# Patient Record
Sex: Male | Born: 1945 | Race: White | Hispanic: No | Marital: Married | Smoking: Never smoker
Health system: Southern US, Community
[De-identification: ages and names within clinical notes are randomized; demographics above are authoritative.]

## PROBLEM LIST (undated history)

## (undated) DIAGNOSIS — M1990 Unspecified osteoarthritis, unspecified site: Secondary | ICD-10-CM

## (undated) DIAGNOSIS — R053 Chronic cough: Secondary | ICD-10-CM

## (undated) DIAGNOSIS — Z87442 Personal history of urinary calculi: Secondary | ICD-10-CM

## (undated) DIAGNOSIS — G8929 Other chronic pain: Secondary | ICD-10-CM

## (undated) DIAGNOSIS — D494 Neoplasm of unspecified behavior of bladder: Secondary | ICD-10-CM

## (undated) DIAGNOSIS — K219 Gastro-esophageal reflux disease without esophagitis: Secondary | ICD-10-CM

## (undated) DIAGNOSIS — K829 Disease of gallbladder, unspecified: Secondary | ICD-10-CM

## (undated) DIAGNOSIS — I82409 Acute embolism and thrombosis of unspecified deep veins of unspecified lower extremity: Secondary | ICD-10-CM

## (undated) DIAGNOSIS — J8283 Eosinophilic asthma: Secondary | ICD-10-CM

## (undated) DIAGNOSIS — T7840XA Allergy, unspecified, initial encounter: Secondary | ICD-10-CM

## (undated) DIAGNOSIS — Z87438 Personal history of other diseases of male genital organs: Secondary | ICD-10-CM

## (undated) DIAGNOSIS — Z8719 Personal history of other diseases of the digestive system: Secondary | ICD-10-CM

## (undated) DIAGNOSIS — J45909 Unspecified asthma, uncomplicated: Secondary | ICD-10-CM

## (undated) DIAGNOSIS — Z8669 Personal history of other diseases of the nervous system and sense organs: Secondary | ICD-10-CM

## (undated) DIAGNOSIS — R251 Tremor, unspecified: Secondary | ICD-10-CM

## (undated) DIAGNOSIS — Z86711 Personal history of pulmonary embolism: Secondary | ICD-10-CM

## (undated) DIAGNOSIS — I10 Essential (primary) hypertension: Secondary | ICD-10-CM

## (undated) DIAGNOSIS — D696 Thrombocytopenia, unspecified: Secondary | ICD-10-CM

## (undated) HISTORY — PX: HERNIA REPAIR: SHX51

## (undated) HISTORY — PX: TOTAL HIP ARTHROPLASTY: SHX124

## (undated) HISTORY — PX: PROSTATE SURGERY: SHX751

## (undated) HISTORY — DX: Unspecified asthma, uncomplicated: J45.909

## (undated) HISTORY — DX: Essential (primary) hypertension: I10

## (undated) HISTORY — PX: NASAL SINUS SURGERY: SHX719

## (undated) HISTORY — PX: CHOLECYSTECTOMY: SHX55

---
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
## 1999-02-07 HISTORY — PX: EYE SURGERY: SHX253

## 2008-02-07 HISTORY — PX: CHOLECYSTECTOMY: SHX55

## 2008-02-07 HISTORY — PX: HERNIA REPAIR: SHX51

## 2014-02-06 HISTORY — PX: TOTAL HIP ARTHROPLASTY: SHX124

## 2016-03-14 ENCOUNTER — Encounter: Payer: Self-pay | Admitting: Internal Medicine

## 2016-03-14 LAB — PULMONARY FUNCTION TEST

## 2017-04-06 DIAGNOSIS — I82409 Acute embolism and thrombosis of unspecified deep veins of unspecified lower extremity: Secondary | ICD-10-CM

## 2017-04-06 HISTORY — DX: Acute embolism and thrombosis of unspecified deep veins of unspecified lower extremity: I82.409

## 2017-04-23 ENCOUNTER — Encounter: Payer: Self-pay | Admitting: Internal Medicine

## 2017-04-24 ENCOUNTER — Encounter: Payer: Self-pay | Admitting: Internal Medicine

## 2017-04-25 ENCOUNTER — Encounter: Payer: Self-pay | Admitting: Internal Medicine

## 2017-05-23 ENCOUNTER — Encounter: Payer: Self-pay | Admitting: Internal Medicine

## 2017-05-23 DIAGNOSIS — J45909 Unspecified asthma, uncomplicated: Secondary | ICD-10-CM

## 2017-05-23 DIAGNOSIS — N401 Enlarged prostate with lower urinary tract symptoms: Secondary | ICD-10-CM

## 2017-05-23 DIAGNOSIS — K219 Gastro-esophageal reflux disease without esophagitis: Secondary | ICD-10-CM

## 2017-05-23 DIAGNOSIS — I10 Essential (primary) hypertension: Secondary | ICD-10-CM | POA: Insufficient documentation

## 2017-05-23 DIAGNOSIS — N400 Enlarged prostate without lower urinary tract symptoms: Secondary | ICD-10-CM | POA: Insufficient documentation

## 2017-05-24 ENCOUNTER — Ambulatory Visit: Payer: Medicare Other | Admitting: Internal Medicine

## 2017-05-24 VITALS — BP 110/64 | HR 94 | Ht 70.0 in | Wt 206.0 lb

## 2017-05-24 DIAGNOSIS — I82431 Acute embolism and thrombosis of right popliteal vein: Secondary | ICD-10-CM

## 2017-05-24 DIAGNOSIS — J45909 Unspecified asthma, uncomplicated: Secondary | ICD-10-CM | POA: Diagnosis not present

## 2017-05-24 DIAGNOSIS — I2692 Saddle embolus of pulmonary artery without acute cor pulmonale: Secondary | ICD-10-CM

## 2017-05-24 MED ORDER — APIXABAN 5 MG PO TABS: 5.0000 mg | ORAL_TABLET | Freq: Two times a day (BID) | ORAL | 11 refills | Status: DC

## 2017-05-24 NOTE — Patient Instructions (Addendum)
Will refer to allergist.   Continue eliquis.  I would recommend a D-Dimer test and leg ultrasound when stopping eliquis, if either is elevated, you may benefit from staying on the eliquis.   If the Eliquis is stopped, you may benefit from a full dose aspirin or a dose of eliquis with long drives.

## 2017-05-24 NOTE — Progress Notes (Signed)
[facility] Consultation      Assessment and Plan:  Severe asthma, cough variant.  -Severe cough of many years duration, with elevated eosinophil counts in the past.  These appear to have resolved with Nucala. - We offered to start Nucala injections, however these are only performed in our [place] office.  Patient would like something locally, therefore we are happy to refer him to a local allergist to restart his Nucala injections.  PE and right lower extremity DVT.  -Symptoms appear to have resolved, the patient appears to have recovered. -I discussed with him today that the typical course is 3-6 months of Eliquis.  In his case I would recommend 6 months with an ultrasound and d-dimer at the end of the course.  If these are positive he may benefit from continuation of his Eliquis. -If Eliquis is discontinued he might benefit from a full dose aspirin for dose of Eliquis with long drives. -Patient has most of his physicians down in [place], he is going to spend his time in between the 2 locations.  He is going to follow-up with [place] on an as-needed basis.  Meds ordered this encounter  Medications  . apixaban (ELIQUIS) 5 MG TABS tablet    Sig: Take 1 tablet (5 mg total) by mouth 2 (two) times daily.    Dispense:  60 tablet    Refill:  11      Date: 05/24/2017  MRN# [id number] Daniel Mason 11-02-1945  Referring Physician: self referred.   Daniel Mason is a 72 y.o. old male seen in consultation for chief complaint of:    Chief Complaint  Patient presents with  . Consult    HPI:   The patient (pronounced Meter) is a 72 year old male he was admitted and set up for a [facility] in [place], he was in the process of buying a new home in [place], and had driven to [place] and back to [place].  He was presented to the hospital on 04/23/17 with symptoms of dyspnea, left lower extremity pain and swelling.  He has no previous history of DVT,  PE.  Nor does he have any family history of DVT or thromboembolic disease.  On initial presentation his creatinine was mildly elevated, he was also noted to have an iodine allergy he was diagnosed with pulmonary embolism based on high probability VQ scan.  Doppler scanning also showed a left lower extremity popliteal DVT.  He required 2 L of oxygen and was otherwise hemodynamically stable.  Patient was treated with Lovenox and discharged with Eliquis.  Echocardiogram performed on 3/19 showed an EF of 50%, mild pulmonary hypertension of 40-45 mmHg.  He has a history of severe asthma, hypertension, GERD he is treated with Nucala for his hypertension. He feels that his breathing is doing well and is back to normal. He used to have severe cough variant asthma for several years and had severe cough. He was started on Nucala about 3 years ago and the cough is now gone. He gets Nucala once monthly.  They travel often between [place], about 5-6 trips per year.   Extensive outside records were reviewed and summarized in the HPI above.  PMHX:   No past medical history on file. Surgical Hx:  Past Surgical History:  Procedure Laterality Date  . CHOLECYSTECTOMY    . HERNIA REPAIR    . NASAL SINUS SURGERY    . PROSTATE SURGERY     x2  .  TOTAL HIP ARTHROPLASTY     Family Hx:  No family history on file. Social Hx:   Social History   Tobacco Use  . Smoking status: Never Smoker  . Smokeless tobacco: Never Used  Substance Use Topics  . Alcohol use: Never    Frequency: Never  . Drug use: Never   Medication:    Current Outpatient Medications:  .  albuterol (VENTOLIN HFA) 108 (90 Base) MCG/ACT inhaler, Inhale 2 puffs into the lungs every 6 (six) hours as needed for wheezing or shortness of breath., Disp: , Rfl:  .  apixaban (ELIQUIS) 5 MG TABS tablet, Take 5 mg by mouth 2 (two) times daily., Disp: , Rfl:  .  beclomethasone (QVAR) 40 MCG/ACT inhaler, Inhale 2 puffs into the lungs at bedtime.,  Disp: , Rfl:  .  famotidine (PEPCID) 10 MG tablet, Take 10 mg by mouth at bedtime., Disp: , Rfl:  .  FINASTERIDE PO, Take 1 capsule by mouth daily., Disp: , Rfl:  .  tamsulosin (FLOMAX) 0.4 MG CAPS capsule, Take 0.4 mg by mouth., Disp: , Rfl:  .  valsartan (DIOVAN) 40 MG tablet, Take 40 mg by mouth daily., Disp: , Rfl:    Allergies:  Iodine; Penicillins; and Sulfa antibiotics  Review of Systems: Gen:  Denies  fever, sweats, chills HEENT: Denies blurred vision, double vision. bleeds, sore throat Cvc:  No dizziness, chest pain. Resp:   Denies cough or sputum production, shortness of breath Gi: Denies swallowing difficulty, stomach pain. Gu:  Denies bladder incontinence, burning urine Ext:   No Joint pain, stiffness. Skin: No skin rash,  hives  Endoc:  No polyuria, polydipsia. Psych: No depression, insomnia. Other:  All other systems were reviewed with the patient and were negative other that what is mentioned in the HPI.   Physical Examination:   VS: BP 110/64 (BP Location: Left Arm, Cuff Size: Normal)   Pulse 94   Ht 5\' 10"  (1.778 m)   Wt 206 lb (93.4 kg)   SpO2 95%   BMI 29.56 kg/m   General Appearance: No distress  Neuro:without focal findings,  speech normal,  HEENT: PERRLA, EOM intact.   Pulmonary: normal breath sounds, No wheezing.  CardiovascularNormal S1,S2.  No m/r/g.   Abdomen: Benign, Soft, non-tender. Renal:  No costovertebral tenderness  GU:  No performed at this time. Endoc: No evident thyromegaly, no signs of acromegaly. Skin:   warm, no rashes, no ecchymosis  Extremities: normal, no cyanosis, clubbing.  No extremity edema.  Other findings:    LABORATORY PANEL:   CBC No results for input(s): WBC, HGB, HCT, PLT in the last 168 hours. ------------------------------------------------------------------------------------------------------------------  Chemistries  No results for input(s): NA, K, CL, CO2, GLUCOSE, BUN, CREATININE, CALCIUM, MG, AST, ALT,  ALKPHOS, BILITOT in the last 168 hours.  Invalid input(s): GFRCGP ------------------------------------------------------------------------------------------------------------------  Cardiac Enzymes No results for input(s): TROPONINI in the last 168 hours. ------------------------------------------------------------  RADIOLOGY:  No results found.     Thank  you for the consultation and for allowing [facility] to assist in the care of your patient. Our recommendations are noted above.  Please contact us if we can be of further service.   Marda Stalker, MD.  Board Certified in Internal Medicine, Pulmonary Medicine, [place], and Sleep Medicine.  [facility] Office Number: [phone]  Patricia Pesa, M.D.  Merton Border, M.D  05/24/2017

## 2017-07-30 ENCOUNTER — Telehealth: Payer: Self-pay | Admitting: Internal Medicine

## 2017-07-30 NOTE — Telephone Encounter (Signed)
After stopping blood thinner, check D-Dimer and bilateral LE ultrasound 2 weeks later.  However I would recommend that he take the blood thinner for 6 months from the time of the initial blood clot (in March).

## 2017-07-30 NOTE — Telephone Encounter (Signed)
Patient came by office  Patient states he will be wanting to quit Eliquis in August and have his D-Dimer test done around that time Please call to discuss

## 2017-07-31 NOTE — Telephone Encounter (Signed)
Pt advised. He will continue Eliquis and have test performed in [place]. He is about to travel.

## 2018-09-17 ENCOUNTER — Other Ambulatory Visit: Payer: Self-pay | Admitting: *Deleted

## 2018-09-17 ENCOUNTER — Other Ambulatory Visit: Payer: Self-pay | Admitting: Urology

## 2018-09-17 DIAGNOSIS — R310 Gross hematuria: Secondary | ICD-10-CM

## 2018-09-26 ENCOUNTER — Ambulatory Visit: Admission: RE | Admit: 2018-09-26 | Payer: Self-pay | Source: Ambulatory Visit

## 2018-09-30 DIAGNOSIS — D696 Thrombocytopenia, unspecified: Secondary | ICD-10-CM | POA: Insufficient documentation

## 2018-09-30 NOTE — Progress Notes (Signed)
[facility]  [phone]) [phone] Fax:([phone]) [phone]  ID: Sharlotte Alamo OB: 1945/09/29  MR#: [id number]  [id number]  Patient Care Team: System, Pcp Not In as PCP - General  CHIEF COMPLAINT: Thrombocytopenia  INTERVAL HISTORY: Patient is a 73 year old male on Eliquis who was also noted to have some hematuria.  Subsequent work-up revealed thrombocytopenia.  He currently feels well and is asymptomatic.  He has no further bleeding.  He has no neurologic complaints.  He denies any recent fevers or illnesses.  He has no new medications.  He has a good appetite and denies weight loss.  He denies any chest pain, shortness of breath, cough, or hemoptysis.  He has no nausea, vomiting, constipation, or diarrhea.  He has no melena or hematochezia.  He has no further urinary complaints.  Patient feels at his baseline and offers no specific complaints today.  REVIEW OF SYSTEMS:   Review of Systems  Constitutional: Negative.  Negative for fever, malaise/fatigue and weight loss.  Respiratory: Negative.  Negative for cough, hemoptysis and shortness of breath.   Cardiovascular: Negative.  Negative for chest pain and leg swelling.  Gastrointestinal: Negative.  Negative for abdominal pain, blood in stool and melena.  Genitourinary: Negative.  Negative for hematuria.  Musculoskeletal: Negative.  Negative for back pain.  Skin: Negative.  Negative for rash.  Neurological: Negative.  Negative for dizziness, focal weakness, weakness and headaches.  Endo/Heme/Allergies: Does not bruise/bleed easily.  Psychiatric/Behavioral: Negative.  The patient is not nervous/anxious.     As per HPI. Otherwise, a complete review of systems is negative.  PAST MEDICAL HISTORY: Past Medical History:  Diagnosis Date  . Asthma   . DVT (deep venous thrombosis) ([facility]) 04/2017  . Hypertension     PAST SURGICAL HISTORY: Past Surgical History:  Procedure Laterality Date  . CHOLECYSTECTOMY    . HERNIA  REPAIR    . NASAL SINUS SURGERY    . PROSTATE SURGERY     x2  . TOTAL HIP ARTHROPLASTY      FAMILY HISTORY: History reviewed. No pertinent family history.  ADVANCED DIRECTIVES (Y/N):  N  HEALTH MAINTENANCE: Social History   Tobacco Use  . Smoking status: Never Smoker  . Smokeless tobacco: Never Used  Substance Use Topics  . Alcohol use: Never    Frequency: Never  . Drug use: Never     Colonoscopy:  PAP:  Bone density:  Lipid panel:  Allergies  Allergen Reactions  . Iodine Shortness Of Breath and Cough  . Penicillins   . Sulfa Antibiotics     Current Outpatient Medications  Medication Sig Dispense Refill  . albuterol (VENTOLIN HFA) 108 (90 Base) MCG/ACT inhaler Inhale 2 puffs into the lungs every 6 (six) hours as needed for wheezing or shortness of breath.    Marland Kitchen FINASTERIDE PO Take 1 capsule by mouth daily.    . Mepolizumab (NUCALA [place]) Inject into the skin every 30 (thirty) days.    . tamsulosin (FLOMAX) 0.4 MG CAPS capsule Take 0.4 mg by mouth.    . valsartan (DIOVAN) 40 MG tablet Take 40 mg by mouth daily.    Marland Kitchen apixaban (ELIQUIS) 5 MG TABS tablet Take 1 tablet (5 mg total) by mouth 2 (two) times daily. (Patient not taking: Reported on 10/03/2018) 60 tablet 11  . beclomethasone (QVAR) 40 MCG/ACT inhaler Inhale 2 puffs into the lungs at bedtime.    . famotidine (PEPCID) 10 MG tablet Take 10 mg by mouth at bedtime.     No  current facility-administered medications for this visit.     OBJECTIVE: Vitals:   10/03/18 1331  BP: (!) 142/83  Pulse: 71  Resp: 18  Temp: 97.8 F (36.6 C)     Body mass index is 29.46 kg/m.    ECOG FS:0 - Asymptomatic  General: Well-developed, well-nourished, no acute distress. Eyes: Pink conjunctiva, anicteric sclera. HEENT: Normocephalic, moist mucous membranes, clear oropharnyx. Lungs: Clear to auscultation bilaterally. Heart: Regular rate and rhythm. No rubs, murmurs, or gallops. Abdomen: Soft, nontender, nondistended. No  organomegaly noted, normoactive bowel sounds. Musculoskeletal: No edema, cyanosis, or clubbing. Neuro: Alert, answering all questions appropriately. Cranial nerves grossly intact. Skin: No rashes or petechiae noted. Psych: Normal affect. Lymphatics: No cervical, calvicular, axillary or inguinal LAD.   LAB RESULTS:  No results found for: NA, K, CL, CO2, GLUCOSE, BUN, CREATININE, CALCIUM, PROT, ALBUMIN, AST, ALT, ALKPHOS, BILITOT, GFRNONAA, GFRAA  Lab Results  Component Value Date   WBC 7.2 10/03/2018   HGB 15.0 10/03/2018   HCT 44.2 10/03/2018   MCV 88.9 10/03/2018   PLT 191 10/03/2018     STUDIES: No results found.  ASSESSMENT: Thrombocytopenia.  PLAN:    1. Thrombocytopenia: Resolved.  Patient's platelet count is now within normal limits at 191.  Ferritin, B12, and folate are all within normal limits.  Platelet antibodies and SPEP were drawn for completeness and are pending at time of dictation.  Unclear reason for patient's transient drop of platelets.  May have been consumptive since patient he reports he was actively bleeding at that time.  No further intervention is needed.  Patient will have video assisted telemedicine visit in 3 weeks to discuss his results. 2.  Hematuria: Resolved.  Continue follow-up with urology as indicated. 3.  History of DVT: Patient's reports he was placed on Eliquis over a year ago after having found to have a DVT from an extended car ride driving from [place] to [place] and back.  Patient reports there was no other inciting incident or reason for his DVT.  He does not require additional Eliquis, but if patient had a second DVT or blood clot would recommend lifelong anticoagulation at that time.  Patient expressed interest in continuing anticoagulation for extended trips.  I spent a total of 45 minutes face-to-face with the patient of which greater than 50% of the visit was spent in counseling and coordination of care as detailed above.    Patient expressed understanding and was in agreement with this plan. He also understands that He can call clinic at any time with any questions, concerns, or complaints.    Lloyd Huger, MD   10/04/2018 6:18 AM

## 2018-10-03 ENCOUNTER — Encounter: Payer: Self-pay | Admitting: Oncology

## 2018-10-03 ENCOUNTER — Other Ambulatory Visit: Payer: Self-pay

## 2018-10-03 ENCOUNTER — Inpatient Hospital Stay: Payer: Medicare HMO | Attending: Oncology | Admitting: Oncology

## 2018-10-03 ENCOUNTER — Encounter (INDEPENDENT_AMBULATORY_CARE_PROVIDER_SITE_OTHER): Payer: Self-pay

## 2018-10-03 ENCOUNTER — Inpatient Hospital Stay: Payer: Medicare HMO

## 2018-10-03 DIAGNOSIS — Z79899 Other long term (current) drug therapy: Secondary | ICD-10-CM | POA: Insufficient documentation

## 2018-10-03 DIAGNOSIS — Z7901 Long term (current) use of anticoagulants: Secondary | ICD-10-CM | POA: Diagnosis not present

## 2018-10-03 DIAGNOSIS — D696 Thrombocytopenia, unspecified: Secondary | ICD-10-CM | POA: Diagnosis not present

## 2018-10-03 DIAGNOSIS — J45909 Unspecified asthma, uncomplicated: Secondary | ICD-10-CM | POA: Diagnosis not present

## 2018-10-03 DIAGNOSIS — I10 Essential (primary) hypertension: Secondary | ICD-10-CM | POA: Insufficient documentation

## 2018-10-03 DIAGNOSIS — Z7951 Long term (current) use of inhaled steroids: Secondary | ICD-10-CM | POA: Insufficient documentation

## 2018-10-03 DIAGNOSIS — Z86718 Personal history of other venous thrombosis and embolism: Secondary | ICD-10-CM | POA: Diagnosis not present

## 2018-10-03 LAB — CBC
HCT: 44.2 % (ref 39.0–52.0)
Hemoglobin: 15 g/dL (ref 13.0–17.0)
MCH: 30.2 pg (ref 26.0–34.0)
MCHC: 33.9 g/dL (ref 30.0–36.0)
MCV: 88.9 fL (ref 80.0–100.0)
Platelets: 191 10*3/uL (ref 150–400)
RBC: 4.97 MIL/uL (ref 4.22–5.81)
RDW: 13.2 % (ref 11.5–15.5)
WBC: 7.2 10*3/uL (ref 4.0–10.5)
nRBC: 0 % (ref 0.0–0.2)

## 2018-10-03 LAB — LACTATE DEHYDROGENASE: LDH: 132 U/L (ref 98–192)

## 2018-10-03 LAB — FOLATE: Folate: 26 ng/mL (ref >5.9)

## 2018-10-03 LAB — VITAMIN B12: Vitamin B-12: 587 pg/mL (ref 180–914)

## 2018-10-03 LAB — FERRITIN: Ferritin: 22 ng/mL — ABNORMAL LOW (ref 24–336)

## 2018-10-03 NOTE — Progress Notes (Signed)
Patient has recently moved here from [place].  Being evaluated by urology for hematuria.  During work up he was found to have low platelet count.

## 2018-10-04 LAB — PROTEIN ELECTROPHORESIS, SERUM
A/G Ratio: 1.4 (ref 0.7–1.7)
Albumin ELP: 3.6 g/dL (ref 2.9–4.4)
Alpha-1-Globulin: 0.2 g/dL (ref 0.0–0.4)
Alpha-2-Globulin: 0.8 g/dL (ref 0.4–1.0)
Beta Globulin: 1 g/dL (ref 0.7–1.3)
Gamma Globulin: 0.5 g/dL (ref 0.4–1.8)
Globulin, Total: 2.5 g/dL (ref 2.2–3.9)
Total Protein ELP: 6.1 g/dL (ref 6.0–8.5)

## 2018-10-04 LAB — PLATELET ANTIBODY PROFILE
Glycoprotein IV Antibody: NEGATIVE
HLA Ab Ser Ql EIA: NEGATIVE
IA/IIA Antibody: NEGATIVE
IB/IX Antibody: NEGATIVE
IIB/IIIA Antibody: NEGATIVE

## 2018-10-04 LAB — HAPTOGLOBIN: Haptoglobin: 200 mg/dL (ref 34–355)

## 2018-10-24 NOTE — Progress Notes (Signed)
[facility]  Telephone:(336) [phone] Fax:(336) [phone]  ID: Daniel Mason OB: Jul 06, 1945  MR#: [id number]  [id number]  Patient Care Team: System, Pcp Not In as PCP - General  I connected with Daniel Mason on 10/29/18 at  2:45 PM EDT by video enabled telemedicine visit and verified that I am speaking with the correct person using two identifiers.   I discussed the limitations, risks, security and privacy concerns of performing an evaluation and management service by telemedicine and the availability of in-person appointments. I also discussed with the patient that there may be a patient responsible charge related to this service. The patient expressed understanding and agreed to proceed.   Other persons participating in the visit and their role in the encounter: Patient, MD  Patient's location: Home Provider's location: Clinic  CHIEF COMPLAINT: Thrombocytopenia  INTERVAL HISTORY: Patient agreed to video assisted telemedicine visit for further evaluation and discussion of his laboratory results.  He continues to feel well and remains asymptomatic. He has no neurologic complaints.  He denies any recent fevers or illnesses. He has a good appetite and denies weight loss.  He denies any chest pain, shortness of breath, cough, or hemoptysis.  He has no nausea, vomiting, constipation, or diarrhea.  He has no melena or hematochezia.  He has no further urinary complaints.  Patient offers no specific complaints today.  REVIEW OF SYSTEMS:   Review of Systems  Constitutional: Negative.  Negative for fever, malaise/fatigue and weight loss.  Respiratory: Negative.  Negative for cough, hemoptysis and shortness of breath.   Cardiovascular: Negative.  Negative for chest pain and leg swelling.  Gastrointestinal: Negative.  Negative for abdominal pain, blood in stool and melena.  Genitourinary: Negative.  Negative for hematuria.  Musculoskeletal: Negative.  Negative for back pain.   Skin: Negative.  Negative for rash.  Neurological: Negative.  Negative for dizziness, focal weakness, weakness and headaches.  Endo/Heme/Allergies: Does not bruise/bleed easily.  Psychiatric/Behavioral: Negative.  The patient is not nervous/anxious.     As per HPI. Otherwise, a complete review of systems is negative.  PAST MEDICAL HISTORY: Past Medical History:  Diagnosis Date  . Asthma   . DVT (deep venous thrombosis) ([facility]) 04/2017  . Hypertension     PAST SURGICAL HISTORY: Past Surgical History:  Procedure Laterality Date  . CHOLECYSTECTOMY    . HERNIA REPAIR    . NASAL SINUS SURGERY    . PROSTATE SURGERY     x2  . TOTAL HIP ARTHROPLASTY      FAMILY HISTORY: History reviewed. No pertinent family history.  ADVANCED DIRECTIVES (Y/N):  N  HEALTH MAINTENANCE: Social History   Tobacco Use  . Smoking status: Never Smoker  . Smokeless tobacco: Never Used  Substance Use Topics  . Alcohol use: Never    Frequency: Never  . Drug use: Never     Colonoscopy:  PAP:  Bone density:  Lipid panel:  Allergies  Allergen Reactions  . Iodine Shortness Of Breath and Cough  . Penicillins   . Sulfa Antibiotics     Current Outpatient Medications  Medication Sig Dispense Refill  . albuterol (VENTOLIN HFA) 108 (90 Base) MCG/ACT inhaler Inhale 2 puffs into the lungs every 6 (six) hours as needed for wheezing or shortness of breath.    . beclomethasone (QVAR) 40 MCG/ACT inhaler Inhale 2 puffs into the lungs at bedtime.    Marland Kitchen FINASTERIDE PO Take 1 capsule by mouth daily.    . Mepolizumab (NUCALA [place]) Inject into the skin  every 30 (thirty) days.    . tamsulosin (FLOMAX) 0.4 MG CAPS capsule Take 0.4 mg by mouth.    . valsartan (DIOVAN) 40 MG tablet Take 40 mg by mouth daily.     No current facility-administered medications for this visit.     OBJECTIVE: There were no vitals filed for this visit.   There is no height or weight on file to calculate BMI.    ECOG FS:0 - Asymptomatic   General: Well-developed, well-nourished, no acute distress. HEENT: Normocephalic. Neuro: Alert, answering all questions appropriately. Cranial nerves grossly intact. Psych: Normal affect.   LAB RESULTS:  No results found for: NA, K, CL, CO2, GLUCOSE, BUN, CREATININE, CALCIUM, PROT, ALBUMIN, AST, ALT, ALKPHOS, BILITOT, GFRNONAA, GFRAA  Lab Results  Component Value Date   WBC 7.2 10/03/2018   HGB 15.0 10/03/2018   HCT 44.2 10/03/2018   MCV 88.9 10/03/2018   PLT 191 10/03/2018     STUDIES: No results found.  ASSESSMENT: Thrombocytopenia.  PLAN:    1. Thrombocytopenia: Resolved.  Patient's platelet count is now within normal limits at 191.  Ferritin, B12, and folate are all within normal limits.  Platelet antibodies and SPEP are negative. Unclear reason for patient's transient drop of platelets.  May have been consumptive since patient he reports he was actively bleeding at that time.  No intervention is needed.  No follow-up has been scheduled.  Please refer patient back if there are any questions or concerns.  2.  Hematuria: Resolved.  Continue follow-up with urology as indicated. 3.  History of DVT: Patient's reports he was placed on Eliquis over a year ago after having found to have a DVT from an extended car ride driving from [place] to [place] and back.  Patient reports there was no other inciting incident or reason for his DVT.  He does not require additional Eliquis, but if patient had a second DVT or blood clot would recommend lifelong anticoagulation at that time.  Patient expressed interest in continuing anticoagulation for extended trips.  I provided 15 minutes of face-to-face video visit time during this encounter, and > 50% was spent counseling as documented under my assessment & plan.    Patient expressed understanding and was in agreement with this plan. He also understands that He can call clinic at any time with any questions, concerns, or complaints.     Lloyd Huger, MD   10/29/2018 3:33 PM

## 2018-10-28 ENCOUNTER — Inpatient Hospital Stay: Payer: Medicare HMO | Attending: Oncology | Admitting: Oncology

## 2018-10-28 ENCOUNTER — Other Ambulatory Visit: Payer: Self-pay

## 2018-10-28 ENCOUNTER — Encounter: Payer: Self-pay | Admitting: Oncology

## 2018-10-28 DIAGNOSIS — D696 Thrombocytopenia, unspecified: Secondary | ICD-10-CM

## 2018-10-28 NOTE — Progress Notes (Signed)
Patient stated that he had been doing well and would like his lab results.

## 2018-11-04 ENCOUNTER — Other Ambulatory Visit: Payer: Medicare HMO

## 2018-11-13 NOTE — H&P (Signed)
NAME: Daniel Mason, Daniel Mason MEDICAL RECORD [id number] ACCOUNT [id number] DATE OF BIRTH:27-Jun-1945 FACILITY: [facility] LOCATION: [facility] PHYSICIAN:MICHAEL Farrel Conners, MD  HISTORY AND PHYSICAL  DATE OF ADMISSION:  11/21/2018  CHIEF COMPLAINT:  Urinary retention and bloody urine.  HISTORY OF PRESENT ILLNESS:  The patient is a 73 year old Caucasian male with gross hematuria and urinary retention.  He underwent cystoscopy with clot evacuation 08/14 and was found to have a 15 mm bladder stone and trilobar BPH with a 5 cm prostatic  urethral length and intravesical growth of median lobe.  The patient was also found to have thrombocytopenia, and hematology evaluation recommended discontinuing Eliquis.  The patient is currently on finasteride.  Upper urinary tract evaluation with CT  scan on 09/04 revealed a 5 mm nonobstructing left renal stone, but otherwise was unremarkable.  The patient comes in now for photovaporization of prostate with GreenLight laser and litholapaxy of the bladder stone.  ALLERGIES:  SULFA DRUGS.  CURRENT MEDICATIONS:  Valsartan, finasteride, tamsulosin, omeprazole, Nucala, men's multivitamin, cyclobenzaprine, and Qvar.  PAST SURGICAL HISTORY:  Bilateral cataract surgery 2001, laser prostatectomy 2001, TURP 2012, cholecystectomy 2013, and left hip replacement in 2016.  PAST AND CURRENT MEDICAL CONDITIONS: 1.  Asthma. 2.  GERD. 3.  Eosinophilia. 4.  Pulmonary embolus 2019. 5.  Kidney stones. 6.  Sciatica. 7.  Benign prostatic hypertrophy with obstruction.  REVIEW OF SYSTEMS:  The patient has chronic constipation.  He denies chest pain, shortness of breath, diabetes, stroke, or heart disease.  SOCIAL HISTORY:  The patient denied tobacco use.  Consumes 4 alcoholic beverages per week.  FAMILY HISTORY:  Father died at age 67 of COPD.  Mother died at age 3 with dementia.  The patient has a brother age 73 with BPH.  PHYSICAL EXAMINATION: VITAL SIGNS:  Height 5 feet 9 inches,  weight 203.  BMI 30. GENERAL:  A well-nourished white male in no distress. HEENT:  Sclerae were clear.  Pupils were equally round, reactive to light and accommodation.  Extraocular movements were intact. NECK:  No palpable masses or tenderness.  Thyroid gland was smooth without nodules.  No audible carotid bruits. LYMPHATIC:  No palpable neck or inguinal adenopathy. PULMONARY:  Lungs clear to auscultation. CARDIOVASCULAR:  Regular rhythm and rate without audible murmurs. ABDOMEN:  Soft, nontender abdomen.  No CVA tenderness. GENITOURINARY:  Circumcised.  Testes were smooth and nontender, approximately 20 mL in size each. RECTAL:  30 g, smooth, nontender prostate. NEUROMUSCULAR:  Alert and oriented x3.  IMPRESSION: 1.  Benign prostatic hypertrophy with bladder outlet obstruction. 2.  Hematuria due to benign prostatic hypertrophy and bladder stone. 3.  Bladder stone.  PLAN: 1.  Photovaporization of prostate with GreenLight laser. 2.  Litholapaxy of bladder stone with holmium laser.  LN/NUANCE  D:11/13/2018 T:11/13/2018 JOB:[id number]

## 2018-11-18 ENCOUNTER — Inpatient Hospital Stay: Admission: RE | Admit: 2018-11-18 | Payer: Medicare HMO | Source: Ambulatory Visit

## 2018-11-19 ENCOUNTER — Encounter
Admission: RE | Admit: 2018-11-19 | Discharge: 2018-11-19 | Disposition: A | Discharge status: Home / Self Care | Payer: Medicare HMO | Source: Ambulatory Visit | Attending: Urology | Admitting: Urology

## 2018-11-19 ENCOUNTER — Other Ambulatory Visit: Payer: Self-pay

## 2018-11-19 DIAGNOSIS — Z01812 Encounter for preprocedural laboratory examination: Secondary | ICD-10-CM | POA: Insufficient documentation

## 2018-11-19 DIAGNOSIS — Z20828 Contact with and (suspected) exposure to other viral communicable diseases: Secondary | ICD-10-CM | POA: Insufficient documentation

## 2018-11-19 DIAGNOSIS — Z01810 Encounter for preprocedural cardiovascular examination: Secondary | ICD-10-CM | POA: Diagnosis not present

## 2018-11-19 HISTORY — DX: Unspecified osteoarthritis, unspecified site: M19.90

## 2018-11-19 HISTORY — DX: Personal history of urinary calculi: Z87.442

## 2018-11-19 HISTORY — DX: Gastro-esophageal reflux disease without esophagitis: K21.9

## 2018-11-19 LAB — BASIC METABOLIC PANEL
Anion gap: 7 (ref 5–15)
BUN: 22 mg/dL (ref 8–23)
CO2: 28 mmol/L (ref 22–32)
Calcium: 8.8 mg/dL — ABNORMAL LOW (ref 8.9–10.3)
Chloride: 106 mmol/L (ref 98–111)
Creatinine, Ser: 1.31 mg/dL — ABNORMAL HIGH (ref 0.61–1.24)
GFR calc Af Amer: >60 mL/min (ref >60)
GFR calc non Af Amer: 54 mL/min — ABNORMAL LOW (ref >60)
Glucose, Bld: 103 mg/dL — ABNORMAL HIGH (ref 70–99)
Potassium: 4 mmol/L (ref 3.5–5.1)
Sodium: 141 mmol/L (ref 135–145)

## 2018-11-19 LAB — CBC
HCT: 47.1 % (ref 39.0–52.0)
Hemoglobin: 16 g/dL (ref 13.0–17.0)
MCH: 29.9 pg (ref 26.0–34.0)
MCHC: 34 g/dL (ref 30.0–36.0)
MCV: 87.9 fL (ref 80.0–100.0)
Platelets: DECREASED 10*3/uL (ref 150–400)
RBC: 5.36 MIL/uL (ref 4.22–5.81)
RDW: 13 % (ref 11.5–15.5)
WBC: 5.7 10*3/uL (ref 4.0–10.5)
nRBC: 0 % (ref 0.0–0.2)

## 2018-11-19 NOTE — Patient Instructions (Addendum)
INSTRUCTIONS FOR SURGERY     Your surgery is scheduled for:   Thursday, November 21, 2018     To find out your arrival time for the day of surgery,          please call [phone] between 1 pm and 3 pm on :  Wednesday, October 14TH     When you arrive for surgery, report to the [facility].       Do NOT stop on the first floor to register.    REMEMBER: Instructions that are not followed completely may result in serious medical risk,  up to and including death, or upon the discretion of your surgeon and anesthesiologist,            your surgery may need to be rescheduled.  __X__ 1. Do not eat food after midnight the night before your procedure.                    No gum, candy, lozenger, tic tacs, tums or hard candies.                  ABSOLUTELY NOTHING SOLID IN YOUR MOUTH AFTER MIDNIGHT                    You may drink unlimited clear liquids up to 2 hours before you are scheduled to arrive for surgery.                   Do not drink anything within those 2 hours unless you need to take medicine, then take the                   smallest amount you need.  Clear liquids include:  water, apple juice without pulp,                   any flavor Gatorade, Black coffee, black tea.  Sugar may be added but no dairy/ honey /lemon.                        Broth and jello is not considered a clear liquid.  __x__  2. On the morning of surgery, please brush your teeth with toothpaste and water. You may rinse with                  mouthwash if you wish but DO NOT SWALLOW TOOTHPASTE OR MOUTHWASH  __X___3. NO alcohol for 24 hours before or after surgery.  __x___ 4.  Do NOT smoke or use e-cigarettes for 24 HOURS PRIOR TO SURGERY.                      DO NOT Use any chewable tobacco products for at least 6 hours prior to surgery.  __x___ 5. If you start any new medication after this appointment and prior to surgery, please                Bring it with you on the day of surgery.  ___x__ 6. Notify your doctor if there is any change in your medical condition,  such as fever, infection, vomitting,                   Diarrhea or any open sores.  __x___ 7.  USE the antibacterial SOAP as instructed, the night before surgery OR the day of surgery.                   Once you have washed with this soap, do NOT use any of the following: Powders, perfumes                    or lotions. Please do not wear make up, hairpins, clips or nail polish. You MAY wear deodorant.                   Men may shave their face and neck.  Women need to shave 48 hours prior to surgery.                   DO NOT wear ANY jewelry on the day of surgery. If there are rings that are too tight to                    remove easily, please address this prior to the surgery day. Piercings need to be removed.                                                                     NO METAL ON YOUR BODY.                    Do NOT bring any valuables.  If you came to Pre-Admit testing then you will not need license,                     insurance card or credit card.  If you will be staying overnight, please either leave your things in                     the car or have your family be responsible for these items.                     [facility] IS NOT RESPONSIBLE FOR BELONGINGS OR VALUABLES.  ___X__ 8. DO NOT wear contact lenses on surgery day.  You may not have dentures,                     Hearing aides, contacts or glasses in the operating room. These items can be                    Placed in the Recovery Room to receive immediately after surgery.  __x___ 9. IF YOU ARE SCHEDULED TO GO HOME ON THE SAME DAY, YOU MUST                   Have someone to drive you home and to stay with you  for the first 24 hours.                    Have an arrangement prior to arriving on surgery day.  ___x__ 10. Take the following medications on the morning of  surgery with a sip  of water:                              1.    ALBUTEROL                                   2.    QVAR                     3.    FINASTERIDE                     4.   PRILOSEC, take the night before as usual and again in the morning                     5.                     6.  _____ 11.  Follow any instructions provided to you by your surgeon.                        Such as enema, clear liquid bowel prep  __X__  12. STOP ASPIRIN AS OF: today                       THIS INCLUDES BC POWDERS / GOODIES POWDER  __x___ 13. STOP Anti-inflammatories as of: today                      This includes IBUPROFEN / MOTRIN / ADVIL / ALEVE/ NAPROXYN                    YOU MAY TAKE TYLENOL ANY TIME PRIOR TO SURGERY.   _____ 15. Bring your CPAP machine into preop with you on the morning of surgery.  __x____17.  Continue to take the following medications but do not take on the morning of surgery:                          Valsartan or multivits  ______18. If staying overnight, please have appropriate shoes to wear to be able to walk around the unit.                   Wear clean and comfortable clothing to the hospital.  Wear comfortable clothes to the hospital. Loose fitting pants

## 2018-11-20 LAB — SARS CORONAVIRUS 2 (TAT 6-24 HRS): SARS Coronavirus 2: NEGATIVE

## 2018-11-21 ENCOUNTER — Encounter: Payer: Self-pay | Admitting: Emergency Medicine

## 2018-11-21 ENCOUNTER — Ambulatory Visit: Payer: Medicare HMO | Admitting: Anesthesiology

## 2018-11-21 ENCOUNTER — Encounter
Admission: RE | Disposition: A | Discharge status: Home / Self Care | Payer: Self-pay | Source: Home / Self Care | Attending: Urology

## 2018-11-21 ENCOUNTER — Ambulatory Visit
Admission: RE | Admit: 2018-11-21 | Discharge: 2018-11-21 | Disposition: A | Discharge status: Home / Self Care | Payer: Medicare HMO | Attending: Urology | Admitting: Urology

## 2018-11-21 ENCOUNTER — Other Ambulatory Visit: Payer: Self-pay

## 2018-11-21 DIAGNOSIS — J45909 Unspecified asthma, uncomplicated: Secondary | ICD-10-CM | POA: Diagnosis not present

## 2018-11-21 DIAGNOSIS — Z86718 Personal history of other venous thrombosis and embolism: Secondary | ICD-10-CM | POA: Diagnosis not present

## 2018-11-21 DIAGNOSIS — Z882 Allergy status to sulfonamides status: Secondary | ICD-10-CM | POA: Diagnosis not present

## 2018-11-21 DIAGNOSIS — Z87442 Personal history of urinary calculi: Secondary | ICD-10-CM | POA: Insufficient documentation

## 2018-11-21 DIAGNOSIS — M1990 Unspecified osteoarthritis, unspecified site: Secondary | ICD-10-CM | POA: Insufficient documentation

## 2018-11-21 DIAGNOSIS — N210 Calculus in bladder: Secondary | ICD-10-CM | POA: Insufficient documentation

## 2018-11-21 DIAGNOSIS — Z7951 Long term (current) use of inhaled steroids: Secondary | ICD-10-CM | POA: Insufficient documentation

## 2018-11-21 DIAGNOSIS — N320 Bladder-neck obstruction: Secondary | ICD-10-CM | POA: Insufficient documentation

## 2018-11-21 DIAGNOSIS — N401 Enlarged prostate with lower urinary tract symptoms: Secondary | ICD-10-CM | POA: Insufficient documentation

## 2018-11-21 DIAGNOSIS — K5909 Other constipation: Secondary | ICD-10-CM | POA: Insufficient documentation

## 2018-11-21 DIAGNOSIS — Z9849 Cataract extraction status, unspecified eye: Secondary | ICD-10-CM | POA: Insufficient documentation

## 2018-11-21 DIAGNOSIS — Z8489 Family history of other specified conditions: Secondary | ICD-10-CM | POA: Insufficient documentation

## 2018-11-21 DIAGNOSIS — R310 Gross hematuria: Secondary | ICD-10-CM | POA: Diagnosis not present

## 2018-11-21 DIAGNOSIS — Z820 Family history of epilepsy and other diseases of the nervous system: Secondary | ICD-10-CM | POA: Diagnosis not present

## 2018-11-21 DIAGNOSIS — Z9049 Acquired absence of other specified parts of digestive tract: Secondary | ICD-10-CM | POA: Insufficient documentation

## 2018-11-21 DIAGNOSIS — R338 Other retention of urine: Secondary | ICD-10-CM | POA: Diagnosis not present

## 2018-11-21 DIAGNOSIS — K219 Gastro-esophageal reflux disease without esophagitis: Secondary | ICD-10-CM | POA: Diagnosis not present

## 2018-11-21 DIAGNOSIS — I10 Essential (primary) hypertension: Secondary | ICD-10-CM | POA: Diagnosis not present

## 2018-11-21 DIAGNOSIS — Z79899 Other long term (current) drug therapy: Secondary | ICD-10-CM | POA: Insufficient documentation

## 2018-11-21 DIAGNOSIS — Z86711 Personal history of pulmonary embolism: Secondary | ICD-10-CM | POA: Insufficient documentation

## 2018-11-21 DIAGNOSIS — M5430 Sciatica, unspecified side: Secondary | ICD-10-CM | POA: Insufficient documentation

## 2018-11-21 DIAGNOSIS — N400 Enlarged prostate without lower urinary tract symptoms: Secondary | ICD-10-CM

## 2018-11-21 DIAGNOSIS — Z96642 Presence of left artificial hip joint: Secondary | ICD-10-CM | POA: Diagnosis not present

## 2018-11-21 DIAGNOSIS — Z825 Family history of asthma and other chronic lower respiratory diseases: Secondary | ICD-10-CM | POA: Diagnosis not present

## 2018-11-21 HISTORY — PX: GREEN LIGHT LASER TURP (TRANSURETHRAL RESECTION OF PROSTATE: SHX6260

## 2018-11-21 HISTORY — PX: PROSTATOTOMY: SUR1057

## 2018-11-21 HISTORY — PX: CYSTOSCOPY WITH LITHOLAPAXY: SHX1425

## 2018-11-21 SURGERY — GREEN LIGHT LASER TURP (TRANSURETHRAL RESECTION OF PROSTATE
Anesthesia: General | Site: Prostate

## 2018-11-21 MED ORDER — CEFAZOLIN SODIUM-DEXTROSE 1-4 GM/50ML-% IV SOLN
1.0000 g | Freq: Once | INTRAVENOUS | Status: AC
  Administered 2018-11-21: 1 g via INTRAVENOUS

## 2018-11-21 MED ORDER — ACETAMINOPHEN 325 MG PO TABS: 325.0000 mg | ORAL_TABLET | ORAL | Status: DC | PRN

## 2018-11-21 MED ORDER — LIDOCAINE HCL (CARDIAC) PF 100 MG/5ML IV SOSY
PREFILLED_SYRINGE | INTRAVENOUS | Status: DC | PRN
  Administered 2018-11-21: 100 mg via INTRAVENOUS

## 2018-11-21 MED ORDER — GLYCOPYRROLATE 0.2 MG/ML IJ SOLN
INTRAMUSCULAR | Status: AC
  Filled 2018-11-21: qty 1

## 2018-11-21 MED ORDER — ONDANSETRON HCL 4 MG/2ML IJ SOLN
INTRAMUSCULAR | Status: DC | PRN
  Administered 2018-11-21: 4 mg via INTRAVENOUS

## 2018-11-21 MED ORDER — BELLADONNA ALKALOIDS-OPIUM 16.2-60 MG RE SUPP
RECTAL | Status: AC
  Filled 2018-11-21: qty 1

## 2018-11-21 MED ORDER — LIDOCAINE HCL URETHRAL/MUCOSAL 2 % EX GEL
CUTANEOUS | Status: DC | PRN
  Administered 2018-11-21: 1 via URETHRAL

## 2018-11-21 MED ORDER — ACETAMINOPHEN 160 MG/5ML PO SOLN
325.0000 mg | ORAL | Status: DC | PRN
  Filled 2018-11-21: qty 20.3

## 2018-11-21 MED ORDER — SUGAMMADEX SODIUM 200 MG/2ML IV SOLN
INTRAVENOUS | Status: DC | PRN
  Administered 2018-11-21: 200 mg via INTRAVENOUS

## 2018-11-21 MED ORDER — EPHEDRINE SULFATE 50 MG/ML IJ SOLN
INTRAMUSCULAR | Status: AC
  Filled 2018-11-21: qty 1

## 2018-11-21 MED ORDER — DOCUSATE SODIUM 100 MG PO CAPS: 200.0000 mg | ORAL_CAPSULE | Freq: Two times a day (BID) | ORAL | 3 refills | Status: DC

## 2018-11-21 MED ORDER — BELLADONNA ALKALOIDS-OPIUM 16.2-60 MG RE SUPP
RECTAL | Status: DC | PRN
  Administered 2018-11-21: 1 via RECTAL

## 2018-11-21 MED ORDER — KETOROLAC TROMETHAMINE 30 MG/ML IJ SOLN
INTRAMUSCULAR | Status: DC | PRN
  Administered 2018-11-21: 15 mg via INTRAVENOUS

## 2018-11-21 MED ORDER — DEXAMETHASONE SODIUM PHOSPHATE 10 MG/ML IJ SOLN
INTRAMUSCULAR | Status: DC | PRN
  Administered 2018-11-21: 4 mg via INTRAVENOUS

## 2018-11-21 MED ORDER — FENTANYL CITRATE (PF) 100 MCG/2ML IJ SOLN
INTRAMUSCULAR | Status: DC | PRN
  Administered 2018-11-21 (×4): 50 ug via INTRAVENOUS

## 2018-11-21 MED ORDER — FENTANYL CITRATE (PF) 100 MCG/2ML IJ SOLN: 25.0000 ug | INTRAMUSCULAR | Status: DC | PRN

## 2018-11-21 MED ORDER — FENTANYL CITRATE (PF) 100 MCG/2ML IJ SOLN
INTRAMUSCULAR | Status: AC
  Filled 2018-11-21: qty 2

## 2018-11-21 MED ORDER — PROPOFOL 10 MG/ML IV BOLUS
INTRAVENOUS | Status: DC | PRN
  Administered 2018-11-21: 150 mg via INTRAVENOUS

## 2018-11-21 MED ORDER — MEPERIDINE HCL 50 MG/ML IJ SOLN: 6.2500 mg | INTRAMUSCULAR | Status: DC | PRN

## 2018-11-21 MED ORDER — LIDOCAINE HCL URETHRAL/MUCOSAL 2 % EX GEL
CUTANEOUS | Status: AC
  Filled 2018-11-21: qty 10

## 2018-11-21 MED ORDER — CIPROFLOXACIN HCL 500 MG PO TABS: 500.0000 mg | ORAL_TABLET | Freq: Two times a day (BID) | ORAL | 0 refills | Status: DC

## 2018-11-21 MED ORDER — LIDOCAINE HCL (PF) 2 % IJ SOLN
INTRAMUSCULAR | Status: AC
  Filled 2018-11-21: qty 10

## 2018-11-21 MED ORDER — LACTATED RINGERS IV BOLUS
500.0000 mL | Freq: Once | INTRAVENOUS | Status: AC
  Administered 2018-11-21: 500 mL via INTRAVENOUS

## 2018-11-21 MED ORDER — SUGAMMADEX SODIUM 200 MG/2ML IV SOLN
INTRAVENOUS | Status: AC
  Filled 2018-11-21: qty 2

## 2018-11-21 MED ORDER — URIBEL 118 MG PO CAPS: 1.0000 | ORAL_CAPSULE | Freq: Four times a day (QID) | ORAL | 3 refills | Status: DC | PRN

## 2018-11-21 MED ORDER — PHENYLEPHRINE HCL (PRESSORS) 10 MG/ML IV SOLN
INTRAVENOUS | Status: AC
  Filled 2018-11-21: qty 1

## 2018-11-21 MED ORDER — SODIUM CHLORIDE (PF) 0.9 % IJ SOLN
INTRAMUSCULAR | Status: AC
  Filled 2018-11-21: qty 10

## 2018-11-21 MED ORDER — ONDANSETRON HCL 4 MG/2ML IJ SOLN
INTRAMUSCULAR | Status: AC
  Filled 2018-11-21: qty 2

## 2018-11-21 MED ORDER — GLYCOPYRROLATE 0.2 MG/ML IJ SOLN
INTRAMUSCULAR | Status: DC | PRN
  Administered 2018-11-21: 0.2 mg via INTRAVENOUS

## 2018-11-21 MED ORDER — SUCCINYLCHOLINE CHLORIDE 20 MG/ML IJ SOLN
INTRAMUSCULAR | Status: DC | PRN
  Administered 2018-11-21: 120 mg via INTRAVENOUS

## 2018-11-21 MED ORDER — DEXAMETHASONE SODIUM PHOSPHATE 10 MG/ML IJ SOLN
INTRAMUSCULAR | Status: AC
  Filled 2018-11-21: qty 1

## 2018-11-21 MED ORDER — SUCCINYLCHOLINE CHLORIDE 20 MG/ML IJ SOLN
INTRAMUSCULAR | Status: AC
  Filled 2018-11-21: qty 1

## 2018-11-21 MED ORDER — PROPOFOL 10 MG/ML IV BOLUS
INTRAVENOUS | Status: AC
  Filled 2018-11-21: qty 20

## 2018-11-21 MED ORDER — CEFAZOLIN SODIUM-DEXTROSE 1-4 GM/50ML-% IV SOLN
INTRAVENOUS | Status: AC
  Filled 2018-11-21: qty 50

## 2018-11-21 MED ORDER — ROCURONIUM BROMIDE 100 MG/10ML IV SOLN
INTRAVENOUS | Status: DC | PRN
  Administered 2018-11-21: 30 mg via INTRAVENOUS
  Administered 2018-11-21 (×2): 10 mg via INTRAVENOUS

## 2018-11-21 MED ORDER — HYDROCODONE-ACETAMINOPHEN 7.5-325 MG PO TABS: 1.0000 | ORAL_TABLET | Freq: Once | ORAL | Status: DC | PRN

## 2018-11-21 MED ORDER — PROMETHAZINE HCL 25 MG/ML IJ SOLN: 6.2500 mg | INTRAMUSCULAR | Status: DC | PRN

## 2018-11-21 MED ORDER — LACTATED RINGERS IV SOLN
INTRAVENOUS | Status: DC
  Administered 2018-11-21: 06:00:00 via INTRAVENOUS

## 2018-11-21 MED ORDER — ROCURONIUM BROMIDE 50 MG/5ML IV SOLN
INTRAVENOUS | Status: AC
  Filled 2018-11-21: qty 1

## 2018-11-21 SURGICAL SUPPLY — 30 items
ADAPTER IRRIG TUBE 2 SPIKE SOL (ADAPTER) ×4 IMPLANT
BAG DRAIN CYSTO-URO LG1000N (MISCELLANEOUS) ×2 IMPLANT
BAG URINE DRAINAGE (UROLOGICAL SUPPLIES) ×2 IMPLANT
CATH FOLEY 2WAY  5CC 20FR SIL (CATHETERS) ×1
CATH FOLEY 2WAY 5CC 20FR SIL (CATHETERS) IMPLANT
CNTNR SPEC 2.5X3XGRAD LEK (MISCELLANEOUS) ×1
CONT SPEC 4OZ STER OR WHT (MISCELLANEOUS) ×1
CONTAINER SPEC 2.5X3XGRAD LEK (MISCELLANEOUS) ×1 IMPLANT
CYSTOSCOPE CON FLOW (MISCELLANEOUS) ×1 IMPLANT
GLOVE BIO SURGEON STRL SZ7.5 (GLOVE) ×2 IMPLANT
GOWN STRL REUS W/ TWL LRG LVL3 (GOWN DISPOSABLE) ×1 IMPLANT
GOWN STRL REUS W/ TWL LRG LVL4 (GOWN DISPOSABLE) ×1 IMPLANT
GOWN STRL REUS W/ TWL XL LVL3 (GOWN DISPOSABLE) ×1 IMPLANT
GOWN STRL REUS W/TWL LRG LVL3 (GOWN DISPOSABLE) ×1
GOWN STRL REUS W/TWL LRG LVL4 (GOWN DISPOSABLE) ×1
GOWN STRL REUS W/TWL XL LVL3 (GOWN DISPOSABLE) ×1
IV NS 1000ML (IV SOLUTION) ×1
IV NS 1000ML BAXH (IV SOLUTION) ×1 IMPLANT
IV SET PRIMARY 15D 139IN B9900 (IV SETS) ×2 IMPLANT
KIT TURNOVER CYSTO (KITS) ×2 IMPLANT
LASER GREENLIGHT XPS PROCEDURE (MISCELLANEOUS) ×1 IMPLANT
LASER GRNLGT MOXY FIBER 750UM (MISCELLANEOUS) ×1 IMPLANT
PACK CYSTO AR (MISCELLANEOUS) ×2 IMPLANT
SET IRRIG Y TYPE TUR BLADDER L (SET/KITS/TRAYS/PACK) ×2 IMPLANT
SOL .9 NS 3000ML IRR  AL (IV SOLUTION) ×4
SOL .9 NS 3000ML IRR UROMATIC (IV SOLUTION) ×4 IMPLANT
SURGILUBE 2OZ TUBE FLIPTOP (MISCELLANEOUS) ×2 IMPLANT
SYRINGE IRR TOOMEY STRL 70CC (SYRINGE) ×2 IMPLANT
WATER STERILE IRR 1000ML POUR (IV SOLUTION) ×2 IMPLANT
WATER STERILE IRR 3000ML UROMA (IV SOLUTION) ×2 IMPLANT

## 2018-11-21 NOTE — Progress Notes (Signed)
Irrigated urinary catheter with 30 mls of normal saline. Line does not look like it is flowing good. I will continue to monitor.

## 2018-11-21 NOTE — Transfer of Care (Signed)
Immediate Anesthesia Transfer of Care Note  Patient: Daniel Mason  Procedure(s) Performed: Ember Gottwald LIGHT LASER TURP (TRANSURETHRAL RESECTION OF PROSTATE (N/A Prostate) CYSTOSCOPY WITH LITHOLAPAXY (N/A Prostate)  Patient Location: PACU  Anesthesia Type:General  Level of Consciousness: awake and alert   Airway & Oxygen Therapy: Patient Spontanous Breathing and Patient connected to face mask oxygen  Post-op Assessment: Report given to RN and Post -op Vital signs reviewed and stable  Post vital signs: Reviewed and stable  Last Vitals:  Vitals Value Taken Time  BP 148/84 11/21/18 0909  Temp 36.3 C 11/21/18 0909  Pulse 84 11/21/18 0914  Resp 17 11/21/18 0914  SpO2 97 % 11/21/18 0914  Vitals shown include unvalidated device data.  Last Pain:  Vitals:   11/21/18 0909  TempSrc:   PainSc: 0-No pain         Complications: No apparent anesthesia complications

## 2018-11-21 NOTE — Progress Notes (Signed)
Dr. Yves Dill ordered a 500 CC bolus and I will continue to monitor him.

## 2018-11-21 NOTE — Progress Notes (Signed)
Irrigated foley with 30 mls of normal saline

## 2018-11-21 NOTE — Anesthesia Post-op Follow-up Note (Signed)
Anesthesia QCDR form completed.        

## 2018-11-21 NOTE — Op Note (Signed)
Preoperative diagnosis: 1.  BPH with bladder outlet obstruction                                            2.  Bladder stone  Postoperative diagnosis: Same  Procedure: 1.  Photo vaporization of the prostate with greenlight laser                      2.  The pexy of bladder stone with holmium laser  Surgeon: Otelia Limes. Yves Dill MD  Anesthesia: General  Indications:See the history and physical. After informed consent the above procedure(s) were requested     Technique and findings: After adequate general anesthesia been obtained patient was placed into dorsal lithotomy position and the perineum was prepped and draped in the usual fashion.  The laser scope was coupled the camera and visually advanced into the bladder.  A 10 x 15 mm bladder stone was encountered in the bladder.  Bladder was moderately trabeculated.  The patient had lateral lobe prostatic hypertrophy with visual obstruction.  No bladder tumors were identified.  Both ureteral orifices were identified and had clear efflux.  At this point the 1000 m holmium laser fiber was introduced through the scope and set at a power of 0.5 W and a frequency of 10.  The bladder stone was then fragmented.  Fragments were then evacuated with a Toomey syringe.  The greenlight XPS laser fiber was then introduced through the scope and set at 4 W.  Bladder neck tissue was initially vaporized followed by lateral lobe tissue.  Finally the apical tissue was vaporized to the level of the verumontanum.  Bleeders were controlled with the coagulative setting.  At this point the laser scope was removed and 10 cc of viscous Xylocaine instilled within the urethra.  A 20 French silicone catheter was placed and irrigated until clear.  B&O suppository was placed.  Blood loss was minimal.  The procedure was then terminated and patient transferred to the recovery room in stable condition.

## 2018-11-21 NOTE — Discharge Instructions (Addendum)
Benign Prostatic Hyperplasia  Benign prostatic hyperplasia (BPH) is an enlarged prostate gland that is caused by the normal aging process and not by cancer. The prostate is a walnut-sized gland that is involved in the production of semen. It is located in front of the rectum and below the bladder. The bladder stores urine and the urethra is the tube that carries the urine out of the body. The prostate may get bigger as a man gets older. An enlarged prostate can press on the urethra. This can make it harder to pass urine. The build-up of urine in the bladder can cause infection. Back pressure and infection may progress to bladder damage and kidney (renal) failure. What are the causes? This condition is part of a normal aging process. However, not all men develop problems from this condition. If the prostate enlarges away from the urethra, urine flow will not be blocked. If it enlarges toward the urethra and compresses it, there will be problems passing urine. What increases the risk? This condition is more likely to develop in men over the age of 75 years. What are the signs or symptoms? Symptoms of this condition include:  Getting up often during the night to urinate.  Needing to urinate frequently during the day.  Difficulty starting urine flow.  Decrease in size and strength of your urine stream.  Leaking (dribbling) after urinating.  Inability to pass urine. This needs immediate treatment.  Inability to completely empty your bladder.  Pain when you pass urine. This is more common if there is also an infection.  Urinary tract infection (UTI). How is this diagnosed? This condition is diagnosed based on your medical history, a physical exam, and your symptoms. Tests will also be done, such as:  A post-void bladder scan. This measures any amount of urine that may remain in your bladder after you finish urinating.  A digital rectal exam. In a rectal exam, your health care provider  checks your prostate by putting a lubricated, gloved finger into your rectum to feel the back of your prostate gland. This exam detects the size of your gland and any abnormal lumps or growths.  An exam of your urine (urinalysis).  A prostate specific antigen (PSA) screening. This is a blood test used to screen for prostate cancer.  An ultrasound. This test uses sound waves to electronically produce a picture of your prostate gland. Your health care provider may refer you to a specialist in kidney and prostate diseases (urologist). How is this treated? Once symptoms begin, your health care provider will monitor your condition (active surveillance or watchful waiting). Treatment for this condition will depend on the severity of your condition. Treatment may include:  Observation and yearly exams. This may be the only treatment needed if your condition and symptoms are mild.  Medicines to relieve your symptoms, including: ? Medicines to shrink the prostate. ? Medicines to relax the muscle of the prostate.  Surgery in severe cases. Surgery may include: ? Prostatectomy. In this procedure, the prostate tissue is removed completely through an open incision or with a laparoscope or robotics. ? Transurethral resection of the prostate (TURP). In this procedure, a tool is inserted through the opening at the tip of the penis (urethra). It is used to cut away tissue of the inner core of the prostate. The pieces are removed through the same opening of the penis. This removes the blockage. ? Transurethral incision (TUIP). In this procedure, small cuts are made in the prostate. This lessens  the prostate's pressure on the urethra. °? Transurethral microwave thermotherapy (TUMT). This procedure uses microwaves to create heat. The heat destroys and removes a small amount of prostate tissue. °? Transurethral needle ablation (TUNA). This procedure uses radio frequencies to destroy and remove a small amount of  prostate tissue. °? Interstitial laser coagulation (ILC). This procedure uses a laser to destroy and remove a small amount of prostate tissue. °? Transurethral electrovaporization (TUVP). This procedure uses electrodes to destroy and remove a small amount of prostate tissue. °? Prostatic urethral lift. This procedure inserts an implant to push the lobes of the prostate away from the urethra. °Follow these instructions at home: °· Take over-the-counter and prescription medicines only as told by your health care provider. °· Monitor your symptoms for any changes. Contact your health care provider with any changes. °· Avoid drinking large amounts of liquid before going to bed or out in public. °· Avoid or reduce how much caffeine or alcohol you drink. °· Give yourself time when you urinate. °· Keep all follow-up visits as told by your health care provider. This is important. °Contact a health care provider if: °· You have unexplained back pain. °· Your symptoms do not get better with treatment. °· You develop side effects from the medicine you are taking. °· Your urine becomes very dark or has a bad smell. °· Your lower abdomen becomes distended and you have trouble passing your urine. °Get help right away if: °· You have a fever or chills. °· You suddenly cannot urinate. °· You feel lightheaded, or very dizzy, or you faint. °· There are large amounts of blood or clots in the urine. °· Your urinary problems become hard to manage. °· You develop moderate to severe low back or flank pain. The flank is the side of your body between the ribs and the hip. °These symptoms may represent a serious problem that is an emergency. Do not wait to see if the symptoms will go away. Get medical help right away. Call your local emergency services (911 in the [place] Do not drive yourself to the hospital. °Summary °· Benign prostatic hyperplasia (BPH) is an enlarged prostate that is caused by the normal aging process and not by  cancer. °· An enlarged prostate can press on the urethra. This can make it hard to pass urine. °· This condition is part of a normal aging process and is more likely to develop in men over the age of 50 years. °· Get help right away if you suddenly cannot urinate. °This information is not intended to replace advice given to you by your health care provider. Make sure you discuss any questions you have with your health care provider. °Document Released: 01/23/2005 Document Revised: 12/18/2017 Document Reviewed: 02/28/2016 °Elsevier Patient Education © 2020 [employer] ° ° °Green Light Laser Prostate Treatment ° °Green light laser therapy is a procedure that uses a high-energy laser to get rid of extra prostate tissue by turning the tissue into a vapor. It is less invasive than traditional methods of prostate surgery, which involve cutting out the prostate tissue. Because the tissue is turned into a vapor (vaporized) rather than cut out, there is generally less blood loss. °This surgery is used to treat an enlarged prostate gland (benign prostatic hyperplasia). °Tell a health care provider about: °· Any allergies you have. °· All medicines you are taking, including vitamins, herbs, eye drops, creams, and over-the-counter medicines. °· Any problems you or family members have had with anesthetic   medicines. °· Any blood disorders you have. °· Any surgeries you have had. °· Any medical conditions you have. °What are the risks? °Generally, this is a safe procedure. However, problems may occur, including: °· Infection. °· Bleeding. °· Allergic reaction to medicines. °· Damage to other structures or organs. °· Blood in the urine (hematuria). °· Painful urination. °· Urinary tract infection. °· Erectile dysfunction (rare). °· Dry ejaculation. °· Scar tissue in the urinary passage. °What happens before the procedure? °Staying hydrated °Follow instructions from your health care provider about hydration, which may  include: °· Up to 2 hours before the procedure - you may continue to drink clear liquids, such as water, clear fruit juice, black coffee, and plain tea. °Eating and drinking restrictions °Follow instructions from your health care provider about eating and drinking, which may include: °· 8 hours before the procedure - stop eating heavy meals or foods such as meat, fried foods, or fatty foods. °· 6 hours before the procedure - stop eating light meals or foods, such as toast or cereal. °· 6 hours before the procedure - stop drinking milk or drinks that contain milk. °· 2 hours before the procedure - stop drinking clear liquids. °Medicines °· Ask your health care provider about: °? Changing or stopping your regular medicines. This is especially important if you are taking diabetes medicines or blood thinners. °? Taking medicines such as aspirin and ibuprofen. These medicines can thin your blood. Do not take these medicines before your procedure if your health care provider instructs you not to. °· You may be prescribed antibiotic medicine. If so, take your antibiotic as told by your health care provider. Do not stop taking the antibiotic even if you start to feel better. °General instructions °· Plan to have someone take you home from the hospital or clinic. °· If you will be going home right after the procedure, plan to have someone with you for 24 hours. °What happens during the procedure? °· To reduce your risk of infection: °? Your health care team will wash or sanitize their hands. °? Your skin will be washed with soap. °· An IV will be inserted into one of your veins. °· You will be given one or more of the following: °? A medicine to help you relax (sedative). °? A medicine to make you fall asleep (general anesthetic). °? A medicine that is injected into your spine to numb the area below and slightly above the injection site (spinal anesthetic). °· A tube containing viewing scopes and instruments (fiber-optic  scope) will be passed through the urethra and into the prostate. The opening of the urethra is at the end of the penis. °· A thin fiber will be put through the tube and positioned next to the extra prostate tissue. °· Pulses of laser light will come from the end of the fiber and be projected onto the extra tissue. Your blood will absorb the light, become hot, and vaporize the extra prostate tissue. °· The heat from the laser beam will seal off the blood vessels, which will lessen bleeding. °· The fiber-optic scope will be removed. °· A thin tube will be inserted into the urethra to drain your urine (urinary catheter). °The procedure may vary among health care providers and hospitals. °What happens after the procedure? °· Your blood pressure, heart rate, breathing rate, and blood oxygen level will be monitored until the medicines you were given have worn off. °· Depending on factors such as the amount of prostate   tissue that was vaporized, the strength of your bladder, and the amount of bleeding expected, your catheter may be removed before you go home. °· You may be given elastic support stockings to wear to help prevent blood clots in your legs. °· Do not drive for 24 hours if you were given a sedative, or for as long as directed by your health care provider. °Summary °· Green light laser therapy is a procedure that uses a high-energy laser that turns extra prostate tissue into a vapor. °· This procedure is less invasive than traditional methods of prostate surgery. °· Follow instructions from your health care provider about eating and drinking before the procedure. °· Pulses of laser light will come from the end of a thin fiber and be aimed at the extra prostate tissue. Your blood will absorb the light, become hot, and vaporize the extra tissue. °This information is not intended to replace advice given to you by your health care provider. Make sure you discuss any questions you have with your health care  provider. °Document Released: 05/02/2007 Document Revised: 05/16/2018 Document Reviewed: 02/12/2016 °Elsevier Patient Education © 2020 [employer] ° ° °Green Light Laser Prostate Treatment, Care After °This sheet gives you information about how to care for yourself after your procedure. Your health care provider may also give you more specific instructions. If you have problems or questions, contact your health care provider. °What can I expect after the procedure? °After the procedure, it is common to have: °· Swelling and discomfort around your urethra. The opening of the urethra is at the end of the penis. °· Blood in your urine. This should go away after a few days. °· Trouble urinating or sudden need to urinate (urgency). These problems should get better over time. You may continue to have a thin tube (catheter) inserted into your urethra to help drain your urine from your bladder for a few days after the procedure. °Follow these instructions at home: °Medicines °· Take over-the-counter and prescription medicines only as told by your health care provider. °· If you were prescribed an antibiotic medicine, take it as told by your health care provider. Do not stop taking the antibiotic even if you start to feel better. °Bathing °· Do not take baths, swim, or use a hot tub until your health care provider approves. Ask your health care provider if you may take showers. You may only be allowed to take sponge baths. °Activity ° °· Do not drive for 24 hours if you were given a medicine to help you relax (sedative) during your procedure. °· Do not drive or use heavy machinery while taking prescription pain medicine. °· Ask your health care provider what activities are safe for you. Most people can return to normal activities within a few days. °? Do not have sex or engage in sexual activity until your health care provider approves. °? Do not lift anything that is heavier than 10 lb (4.5 kg), or the limit that you are  told, until your health care provider says that it is safe. °General instructions ° °  ° °· If you have a urinary catheter, care for it as told by your health care provider. This may include: °? Washing your hands before and after touching the catheter. °? Emptying your drainage bag when it is ?-½ full, or emptying it at least 2-3 times a day. °? Keeping the area around the catheter clean and dry. °? Avoiding any bends or breaks in the catheter. °?   Keeping air out of the catheter. °? Making sure that the catheter is not placed under water. °· Do not use any products that contain nicotine or tobacco, such as cigarettes and e-cigarettes. If you need help quitting, ask your health care provider. °· Drink enough fluid to keep your urine pale yellow. °· Keep all follow-up visits as told by your health care provider. This is important. °Contact a health care provider if: °· You have trouble: °? Having a bowel movement. °? Getting an erection. °· You have swelling around your urethra and it gets worse. °· You have blood in your urine for more than 2 days after the procedure. °· You have pain or burning when you urinate, or other problems that do not go away or cause discomfort. °· You have problems with your catheter or your catheter is blocked. °· You have a fever. °· You have nausea or you vomit. °· You have swelling in your legs. °Get help right away if: °· Your urine has blood clots in it. °· Your urine is dark red. °· You cannot urinate after your catheter is removed. °· You have blood in your stool. °· You have severe pain that does not get better with medicine. °· You have shortness of breath. °Summary °· After the procedure, it is common to have swelling and discomfort around your urethra and blood in your urine for a few days. °· Some men may have problems urinating after this procedure. These problems should go away after a few days. If you have pain or burning while urinating, contact your health care  provider. °· If you have a catheter after this procedure, care for it as told by your health care provider. °· If you have severe pain, dark red urine, or urine with blood clots, get medical help right away. °This information is not intended to replace advice given to you by your health care provider. Make sure you discuss any questions you have with your health care provider. ° °AMBULATORY SURGERY  °DISCHARGE INSTRUCTIONS ° ° °1) The drugs that you were given will stay in your system until tomorrow so for the next 24 hours you should not: ° °A) Drive an automobile °B) Make any legal decisions °C) Drink any alcoholic beverage ° ° °2) You may resume regular meals tomorrow.  Today it is better to start with liquids and gradually work up to solid foods. ° °You may eat anything you prefer, but it is better to start with liquids, then soup and crackers, and gradually work up to solid foods. ° ° °3) Please notify your doctor immediately if you have any unusual bleeding, trouble breathing, redness and pain at the surgery site, drainage, fever, or pain not relieved by medication. ° ° ° °4) Additional Instructions: ° ° ° ° ° ° ° °Please contact your physician with any problems or Same Day Surgery at [phone], Monday through Friday 6 am to 4 pm, or [facility] Main number at [phone]. °Document Released: 08/03/2016 Document Revised: 05/16/2018 Document Reviewed: 08/03/2016 °Elsevier Patient Education © 2020 [employer] ° °

## 2018-11-21 NOTE — Anesthesia Procedure Notes (Signed)
Procedure Name: Intubation Date/Time: 11/21/2018 7:37 AM Performed by: Esaw Grandchild, CRNA Pre-anesthesia Checklist: Patient identified, Emergency Drugs available, Suction available and Patient being monitored Patient Re-evaluated:Patient Re-evaluated prior to induction Oxygen Delivery Method: Circle system utilized Preoxygenation: Pre-oxygenation with 100% oxygen Induction Type: IV induction Ventilation: Mask ventilation without difficulty Laryngoscope Size: Miller and 2 Grade View: Grade I Tube type: Oral Tube size: 8.0 mm Number of attempts: 1 Airway Equipment and Method: Stylet Placement Confirmation: ETT inserted through vocal cords under direct vision,  positive ETCO2 and breath sounds checked- equal and bilateral Secured at: 24 cm Tube secured with: Tape Dental Injury: Teeth and Oropharynx as per pre-operative assessment

## 2018-11-21 NOTE — H&P (Signed)
Date of Initial H&P: 11/13/18  History reviewed, patient examined, no change in status, stable for surgery.

## 2018-11-21 NOTE — Anesthesia Preprocedure Evaluation (Addendum)
Anesthesia Evaluation  Patient identified by MRN, date of birth, ID band Patient awake    Reviewed: Allergy & Precautions, H&P , NPO status , reviewed documented beta blocker date and time   Airway Mallampati: III  TM Distance: >3 FB Neck ROM: full    Dental  (+) Teeth Intact Gapped upper:   Pulmonary asthma ,  Used inhaler today, stable   Pulmonary exam normal        Cardiovascular hypertension, Normal cardiovascular exam     Neuro/Psych    GI/Hepatic GERD  Medicated and Controlled,  Endo/Other    Renal/GU      Musculoskeletal  (+) Arthritis ,   Abdominal   Peds  Hematology   Anesthesia Other Findings Past Medical History: No date: Arthritis No date: Asthma     Comment:  d/t a cough that he had for 30 years 04/2017: DVT (deep venous thrombosis) (HCC)     Comment:  was on blood thinners until hematuria.  actually was a               PE No date: GERD (gastroesophageal reflux disease) No date: History of kidney stones     Comment:  bladder stones and 2 small kidney stones No date: Hypertension  Past Surgical History: 2010: CHOLECYSTECTOMY 2001: EYE SURGERY; Bilateral     Comment:  cataract extractions 2010: HERNIA REPAIR; Left     Comment:  inguinal 1997, 2015: NASAL SINUS SURGERY     Comment:  x 2 2001, 2011: PROSTATE SURGERY     Comment:  .  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; Left  BMI    Body Mass Index: 29.42 kg/m      Reproductive/Obstetrics                            Anesthesia Physical Anesthesia Plan  ASA: II  Anesthesia Plan: General and General ETT   Post-op Pain Management:    Induction: Intravenous  PONV Risk Score and Plan: Ondansetron  Airway Management Planned: LMA  Additional Equipment:   Intra-op Plan:   Post-operative Plan: Extubation in OR  Informed Consent: I have reviewed the patients History and Physical, chart, labs  and discussed the procedure including the risks, benefits and alternatives for the proposed anesthesia with the patient or authorized representative who has indicated his/her understanding and acceptance.     Dental Advisory Given  Plan Discussed with: CRNA  Anesthesia Plan Comments: (Pt declined spinal, prefers GA)      Anesthesia Quick Evaluation

## 2018-11-21 NOTE — Anesthesia Postprocedure Evaluation (Signed)
Anesthesia Post Note  Patient: Sharlotte Alamo  Procedure(s) Performed: GREEN LIGHT LASER TURP (TRANSURETHRAL RESECTION OF PROSTATE (N/A Prostate) CYSTOSCOPY WITH LITHOLAPAXY (N/A Prostate)  Patient location during evaluation: PACU Anesthesia Type: General Level of consciousness: awake and alert Pain management: pain level controlled Vital Signs Assessment: post-procedure vital signs reviewed and stable Respiratory status: spontaneous breathing, nonlabored ventilation, respiratory function stable and patient connected to nasal cannula oxygen Cardiovascular status: blood pressure returned to baseline and stable Postop Assessment: no apparent nausea or vomiting Anesthetic complications: no     Last Vitals:  Vitals:   11/21/18 1129 11/21/18 1205  BP: (!) 149/75 (!) 158/79  Pulse: 77 64  Resp: 18 17  Temp: (!) 36.3 C (!) 35.9 C  SpO2: 98% 98%    Last Pain:  Vitals:   11/21/18 1205  TempSrc: Temporal  PainSc: 0-No pain                 Alphonsus Sias

## 2018-11-22 ENCOUNTER — Encounter: Payer: Self-pay | Admitting: Urology

## 2019-03-18 DIAGNOSIS — D239 Other benign neoplasm of skin, unspecified: Secondary | ICD-10-CM

## 2019-03-18 HISTORY — DX: Other benign neoplasm of skin, unspecified: D23.9

## 2020-01-07 ENCOUNTER — Other Ambulatory Visit: Payer: Self-pay

## 2020-01-07 ENCOUNTER — Ambulatory Visit (INDEPENDENT_AMBULATORY_CARE_PROVIDER_SITE_OTHER): Payer: Medicare HMO | Admitting: Dermatology

## 2020-01-07 DIAGNOSIS — Z1283 Encounter for screening for malignant neoplasm of skin: Secondary | ICD-10-CM

## 2020-01-07 DIAGNOSIS — Q825 Congenital non-neoplastic nevus: Secondary | ICD-10-CM

## 2020-01-07 DIAGNOSIS — L905 Scar conditions and fibrosis of skin: Secondary | ICD-10-CM | POA: Diagnosis not present

## 2020-01-07 DIAGNOSIS — L988 Other specified disorders of the skin and subcutaneous tissue: Secondary | ICD-10-CM | POA: Diagnosis not present

## 2020-01-07 DIAGNOSIS — B353 Tinea pedis: Secondary | ICD-10-CM

## 2020-01-07 DIAGNOSIS — L578 Other skin changes due to chronic exposure to nonionizing radiation: Secondary | ICD-10-CM

## 2020-01-07 DIAGNOSIS — L820 Inflamed seborrheic keratosis: Secondary | ICD-10-CM

## 2020-01-07 DIAGNOSIS — L570 Actinic keratosis: Secondary | ICD-10-CM | POA: Diagnosis not present

## 2020-01-07 DIAGNOSIS — L814 Other melanin hyperpigmentation: Secondary | ICD-10-CM

## 2020-01-07 DIAGNOSIS — D1800 Hemangioma unspecified site: Secondary | ICD-10-CM

## 2020-01-07 DIAGNOSIS — L821 Other seborrheic keratosis: Secondary | ICD-10-CM

## 2020-01-07 DIAGNOSIS — D229 Melanocytic nevi, unspecified: Secondary | ICD-10-CM

## 2020-01-07 MED ORDER — KETOCONAZOLE 2 % EX CREA: 1.0000 "application " | TOPICAL_CREAM | Freq: Every day | CUTANEOUS | 11 refills | Status: DC

## 2020-01-07 NOTE — Progress Notes (Signed)
New Patient Visit  Subjective  Daniel Mason is a 74 y.o. male who presents for the following: Annual Exam (Total body skin exam, hx of Skin Ca, scalp, L side) and growths (neck, abdomen). The patient presents for Total-Body Skin Exam (TBSE) for skin cancer screening and mole check.  Referral from Dr. Juluis Pitch.  The following portions of the chart were reviewed this encounter and updated as appropriate:  Tobacco  Allergies  Meds  Problems  Med Hx  Surg Hx  Fam Hx     Review of Systems:  No other skin or systemic complaints except as noted in HPI or Assessment and Plan.  Objective  Well appearing patient in no apparent distress; mood and affect are within normal limits.  A full examination was performed including scalp, head, eyes, ears, nose, lips, neck, chest, axillae, abdomen, back, buttocks, bilateral upper extremities, bilateral lower extremities, hands, feet, fingers, toes, fingernails, and toenails. All findings within normal limits unless otherwise noted below.  Objective  Left Flank, posterior scalp: scars  Objective  Neck - Posterior: Vascular birthmark  Objective  R clavicle x 1, L xypoid x 1 (2): Erythematous keratotic or waxy stuck-on papule or plaque.   Objective  bil arms: atrophy, purpura and scarring  Objective  Scalp/ears/face x 15 (15): Pink scaly macules   Objective  bil feet/toenails: Scaliness feet and toenail dystrophy   Assessment & Plan    Lentigines - Scattered tan macules - Discussed due to sun exposure - Benign, observe - Call for any changes  Seborrheic Keratoses - Stuck-on, waxy, tan-brown papules and plaques  - Discussed benign etiology and prognosis. - Observe - Call for any changes  Melanocytic Nevi - Tan-brown and/or pink-flesh-colored symmetric macules and papules - Benign appearing on exam today - Observation - Call clinic for new or changing moles - Recommend daily use of broad spectrum spf 30+  sunscreen to sun-exposed areas.   Hemangiomas - Red papules - Discussed benign nature - Observe - Call for any changes  Actinic Damage - Chronic, secondary to cumulative UV/sun exposure - diffuse scaly erythematous macules with underlying dyspigmentation - Recommend daily broad spectrum sunscreen SPF 30+ to sun-exposed areas, reapply every 2 hours as needed.  - Call for new or changing lesions.  Skin cancer screening performed today.  Scar Left Flank, posterior scalp  2ndary to abnormal growths txted in [place], will request pathologies From Dr. Truddie Crumble  Vascular birthmark Neck - Posterior  Benign, observe.    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) Scalp/ears/face x 15  Destruction of lesion - Scalp/ears/face x 15 Complexity: simple   Destruction method: cryotherapy   Informed consent: discussed and consent obtained   Timeout:  patient name, date of birth, surgical site, and procedure verified Lesion destroyed using liquid nitrogen: Yes   Region  frozen until ice ball extended beyond lesion: Yes   Outcome: patient tolerated procedure well with no complications   Post-procedure details: wound care instructions given    Tinea pedis of right foot bil feet/toenails  Tinea Unguium  Chronic, persistent  Start Ketoconazole 2% cream qhs  May consider treating toenails in the future  ketoconazole (NIZORAL) 2 % cream - bil feet/toenails  Return in about 3 months (around 04/06/2020) for 3-35m f/u AKs, ISKs, Tinea pedis/unguium.  I, [name], RMA, am acting as scribe for Sarina Ser, MD .  Documentation: I have reviewed the above documentation for accuracy and completeness, and I agree with the above.  Sarina Ser, MD

## 2020-01-12 ENCOUNTER — Encounter: Payer: Self-pay | Admitting: Dermatology

## 2020-02-17 ENCOUNTER — Telehealth: Payer: Self-pay

## 2020-02-17 ENCOUNTER — Other Ambulatory Visit: Payer: Self-pay

## 2020-02-17 DIAGNOSIS — B353 Tinea pedis: Secondary | ICD-10-CM

## 2020-02-17 MED ORDER — CICLOPIROX OLAMINE 0.77 % EX CREA: TOPICAL_CREAM | CUTANEOUS | 11 refills | Status: DC

## 2020-02-17 NOTE — Telephone Encounter (Addendum)
Dr. Raliegh Ip pt came into office to ask about switching ketoconazole cream for tinea unguium on b/l feet. He stated that since starting the cream it has made his feet itch. Pt was advised to d/c the ketoconazole and  that Dr. Nehemiah Massed is out of the office for the week. We informed pt that we would send a message to one of our other drs to see if they would be comfortable changing the rx, otherwise we would talk to Dr. Raliegh Ip next week.

## 2020-02-17 NOTE — Telephone Encounter (Signed)
Please send in ciclopirox cream to use twice a day. Thank you!

## 2020-03-24 ENCOUNTER — Other Ambulatory Visit: Payer: Self-pay | Admitting: Urology

## 2020-03-24 DIAGNOSIS — R972 Elevated prostate specific antigen [PSA]: Secondary | ICD-10-CM

## 2020-04-07 ENCOUNTER — Other Ambulatory Visit: Payer: Self-pay

## 2020-04-07 ENCOUNTER — Encounter: Payer: Self-pay | Admitting: Dermatology

## 2020-04-07 ENCOUNTER — Ambulatory Visit (INDEPENDENT_AMBULATORY_CARE_PROVIDER_SITE_OTHER): Payer: Medicare HMO | Admitting: Dermatology

## 2020-04-07 DIAGNOSIS — B353 Tinea pedis: Secondary | ICD-10-CM

## 2020-04-07 DIAGNOSIS — L578 Other skin changes due to chronic exposure to nonionizing radiation: Secondary | ICD-10-CM

## 2020-04-07 DIAGNOSIS — B351 Tinea unguium: Secondary | ICD-10-CM

## 2020-04-07 DIAGNOSIS — L570 Actinic keratosis: Secondary | ICD-10-CM

## 2020-04-07 DIAGNOSIS — L820 Inflamed seborrheic keratosis: Secondary | ICD-10-CM | POA: Diagnosis not present

## 2020-04-07 MED ORDER — TERBINAFINE HCL 250 MG PO TABS: 250.0000 mg | ORAL_TABLET | Freq: Every day | ORAL | 0 refills | Status: DC

## 2020-04-07 NOTE — Patient Instructions (Addendum)
Wound Care Instructions  1. Cleanse wound gently with soap and water once a day then pat dry with clean gauze. Apply a thing coat of Petrolatum (petroleum jelly, "Vaseline") over the wound (unless you have an allergy to this). We recommend that you use a new, sterile tube of Vaseline. Do not pick or remove scabs. Do not remove the yellow or white "healing tissue" from the base of the wound.  2. Cover the wound with fresh, clean, nonstick gauze and secure with paper tape. You may use Band-Aids in place of gauze and tape if the would is small enough, but would recommend trimming much of the tape off as there is often too much. Sometimes Band-Aids can irritate the skin.  3. You should call the office for your biopsy report after 1 week if you have not already been contacted.  4. If you experience any problems, such as abnormal amounts of bleeding, swelling, significant bruising, significant pain, or evidence of infection, please call the office immediately.  5. FOR ADULT SURGERY PATIENTS: If you need something for pain relief you may take 1 extra strength Tylenol (acetaminophen) AND 2 Ibuprofen (200mg  each) together every 4 hours as needed for pain. (do not take these if you are allergic to them or if you have a reason you should not take them.) Typically, you may only need pain medication for 1 to 3 days.    Terbinafine Counseling  Terbinafine is an anti-fungal medicine that can be applied to the skin (over the counter) or taken by mouth (prescription) to treat fungal infections. The pill version is often used to treat fungal infections of the nails or scalp. While most people do not have any side effects from taking terbinafine pills, some possible side effects of the medicine can include taste changes, headache, loss of smell, vision changes, nausea, vomiting, or diarrhea.   Rare side effects can include irritation of the liver, allergic reaction, or decrease in blood counts (which may show up as  not feeling well or developing an infection). If you are concerned about any of these side effects, please stop the medicine and call your doctor, or in the case of an emergency such as feeling very unwell, seek immediate medical care.

## 2020-04-07 NOTE — Progress Notes (Signed)
   Follow-Up Visit   Subjective  Daniel Mason is a 75 y.o. male who presents for the following: Actinic Keratosis (4 month follow up - face and ears treated with LN2) and Follow-up (Tinea pedis and unguium - used ketoconazole and it made his feet red and itchy. He is using ciclopirox cream and it is working well).  The following portions of the chart were reviewed this encounter and updated as appropriate:   Tobacco  Allergies  Meds  Problems  Med Hx  Surg Hx  Fam Hx     Review of Systems:  No other skin or systemic complaints except as noted in HPI or Assessment and Plan.  Objective  Well appearing patient in no apparent distress; mood and affect are within normal limits.  A focused examination was performed including scalp, face, neck, feet. Relevant physical exam findings are noted in the Assessment and Plan.  Objective  Bilateral feet: Scaliness  Objective  Toenails: Nail dystrophy  Objective  Neck, shoulders (18): Erythematous keratotic or waxy stuck-on papule or plaque.   Objective  Scalp, face (6): Erythematous thin papules/macules with gritty scale.    Assessment & Plan  Tinea pedis of both feet Bilateral feet Chronic and persistent Continue ciclopirox cream qhs.  Tinea unguium Toenails Chronic and persistent - no liver issues and he is not on cholesterol medications Start Terbinafine 250 mg 1 po qd  terbinafine (LAMISIL) 250 MG tablet - Toenails  Inflamed seborrheic keratosis (18) Neck, shoulders  Destruction of lesion - Neck, shoulders Complexity: simple   Destruction method: cryotherapy   Informed consent: discussed and consent obtained   Timeout:  patient name, date of birth, surgical site, and procedure verified Lesion destroyed using liquid nitrogen: Yes   Region frozen until ice ball extended beyond lesion: Yes   Outcome: patient tolerated procedure well with no complications   Post-procedure details: wound care instructions given    AK  (actinic keratosis) (6) Scalp, face  Destruction of lesion - Scalp, face Complexity: simple   Destruction method: cryotherapy   Informed consent: discussed and consent obtained   Timeout:  patient name, date of birth, surgical site, and procedure verified Lesion destroyed using liquid nitrogen: Yes   Region frozen until ice ball extended beyond lesion: Yes   Outcome: patient tolerated procedure well with no complications   Post-procedure details: wound care instructions given    Actinic Damage - chronic, secondary to cumulative UV radiation exposure/sun exposure over time - diffuse scaly erythematous macules with underlying dyspigmentation - Recommend daily broad spectrum sunscreen SPF 30+ to sun-exposed areas, reapply every 2 hours as needed.  - Recommend staying in the shade or wearing long sleeves, sun glasses (UVA+UVB protection) and wide brim hats (4-inch brim around the entire circumference of the hat). - Call for new or changing lesions.  Return in about 5 weeks (around 05/12/2020).  I, Ashok Cordia, CMA, am acting as scribe for Sarina Ser, MD .  Documentation: I have reviewed the above documentation for accuracy and completeness, and I agree with the above.  Sarina Ser, MD

## 2020-04-08 ENCOUNTER — Ambulatory Visit
Admission: RE | Admit: 2020-04-08 | Discharge: 2020-04-08 | Disposition: A | Discharge status: Home / Self Care | Payer: Medicare HMO | Source: Ambulatory Visit | Attending: Urology | Admitting: Urology

## 2020-04-08 DIAGNOSIS — R972 Elevated prostate specific antigen [PSA]: Secondary | ICD-10-CM | POA: Insufficient documentation

## 2020-04-08 IMAGING — MR MR PROSTATE WO/W CM
56 series · 56 of 56 positions shown · IV contrast (gadavist)
Comparison: None.

CLINICAL DATA: Elevated PSA level. Prior transurethral resection of
the prostate. Reported remote prostate biopsy 20 years or more ago.

EXAM:
MR PROSTATE WITHOUT AND WITH CONTRAST
TECHNIQUE: Multiplanar multisequence MRI images were obtained of the pelvis
centered about the prostate. Pre and post contrast images were
obtained.
CONTRAST:  10mL GADAVIST GADOBUTROL 1 MMOL/ML IV SOLN

[Series 3: ax in&out whole · axial · 6.0mm · 0.74mm/px · 1 of 35 slices shown (1 of 2)]
[im 1/35]
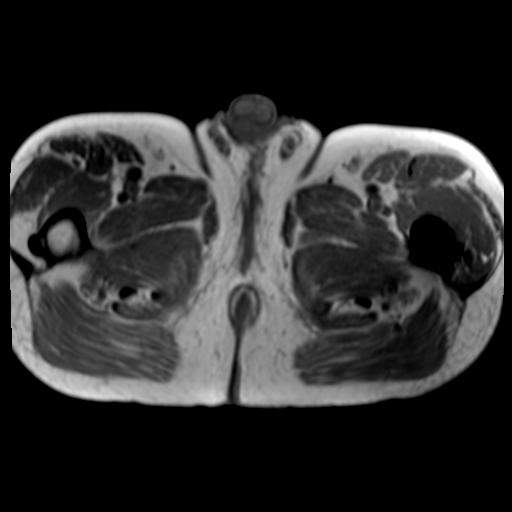

[Series 3: ax in&out whole · axial · 6.0mm · 0.74mm/px · 1 of 35 slices shown (2 of 2)]
[im 1/35]
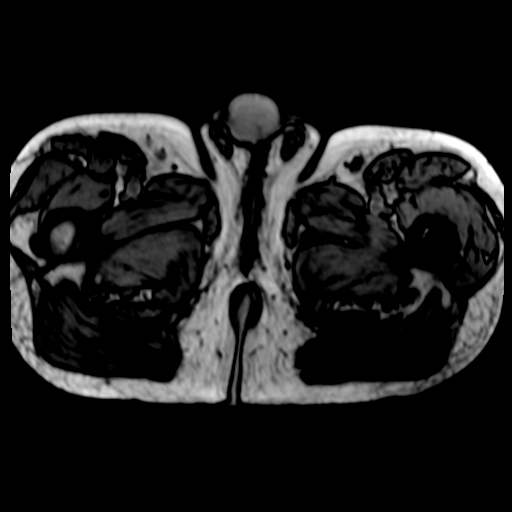

[Series 4: T2 · axial · 3.0mm · 0.56mm/px · 1 of 25 slices shown (1 of 3)]
[im 1/25]
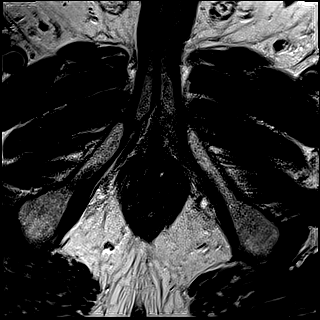

[Series 5: T2 · coronal · 3.0mm · 0.70mm/px · 1 of 35 slices shown (2 of 3)]
[im 1/35]
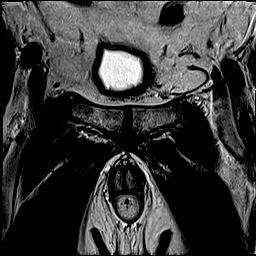

[Series 6: DWI · axial · 3.0mm · 0.86mm/px · 1 of 75 slices shown (1 of 3)]
[im 1/75]
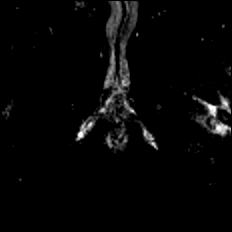

[Series 7: DWI · axial · 3.0mm · 0.86mm/px · 1 of 25 slices shown (2 of 3)]
[im 1/25]
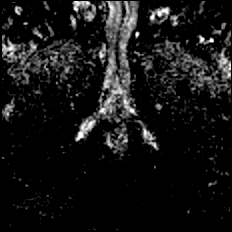

[Series 8: DWI · axial · 3.0mm · 0.86mm/px · 1 of 19 slices shown (3 of 3)]
[im 1/19]
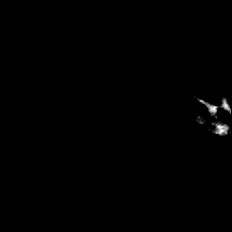

[Series 9: T2 · axial · 1.0mm · 1.04mm/px · 1 of 80 slices shown (3 of 3)]
[im 1/80]
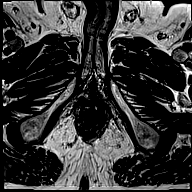

[Series 10: T1 · axial · 3.0mm · 1.15mm/px · 1 of 28 slices shown (1 of 48)]
[im 1/28]
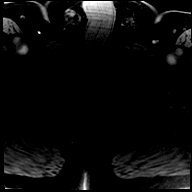

[Series 11: T1 · axial · 3.0mm · 1.15mm/px · 1 of 28 slices shown (2 of 48)]
[im 1/28]
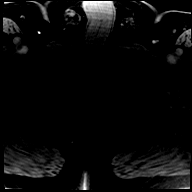

[Series 12: T1 · axial · 3.0mm · 1.15mm/px · 1 of 28 slices shown (3 of 48)]
[im 1/28]
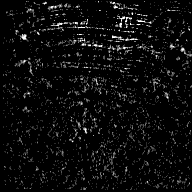

[Series 13: T1 · axial · 3.0mm · 1.15mm/px · 1 of 28 slices shown (4 of 48)]
[im 1/28]
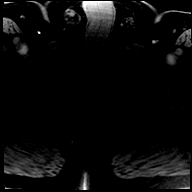

[Series 14: T1 · axial · 3.0mm · 1.15mm/px · 1 of 28 slices shown (5 of 48)]
[im 1/28]
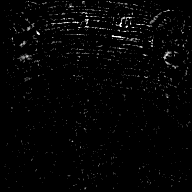

[Series 15: T1 · axial · 3.0mm · 1.15mm/px · 1 of 28 slices shown (6 of 48)]
[im 1/28]
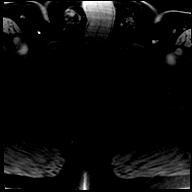

[Series 16: T1 · axial · 3.0mm · 1.15mm/px · 1 of 28 slices shown (7 of 48)]
[im 1/28]
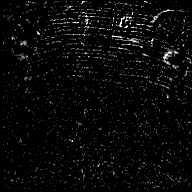

[Series 17: T1 · axial · 3.0mm · 1.15mm/px · 1 of 28 slices shown (8 of 48)]
[im 1/28]
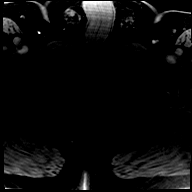

[Series 18: T1 · axial · 3.0mm · 1.15mm/px · 1 of 28 slices shown (9 of 48)]
[im 1/28]
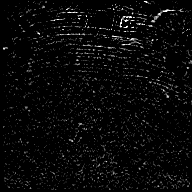

[Series 19: T1 · axial · 3.0mm · 1.15mm/px · 1 of 28 slices shown (10 of 48)]
[im 1/28]
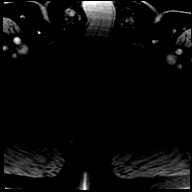

[Series 20: T1 · axial · 3.0mm · 1.15mm/px · 1 of 28 slices shown (11 of 48)]
[im 1/28]
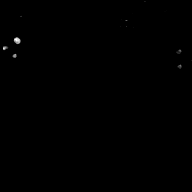

[Series 21: T1 · axial · 3.0mm · 1.15mm/px · 1 of 28 slices shown (12 of 48)]
[im 1/28]
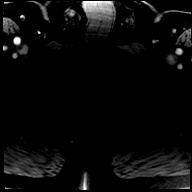

[Series 22: T1 · axial · 3.0mm · 1.15mm/px · 1 of 28 slices shown (13 of 48)]
[im 1/28]
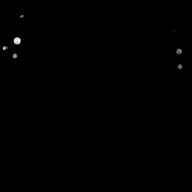

[Series 23: T1 · axial · 3.0mm · 1.15mm/px · 1 of 28 slices shown (14 of 48)]
[im 1/28]
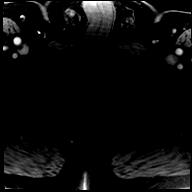

[Series 24: T1 · axial · 3.0mm · 1.15mm/px · 1 of 28 slices shown (15 of 48)]
[im 1/28]
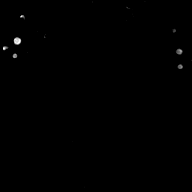

[Series 25: T1 · axial · 3.0mm · 1.15mm/px · 1 of 28 slices shown (16 of 48)]
[im 1/28]
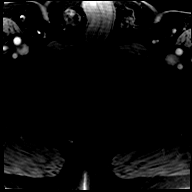

[Series 26: T1 · axial · 3.0mm · 1.15mm/px · 1 of 28 slices shown (17 of 48)]
[im 1/28]
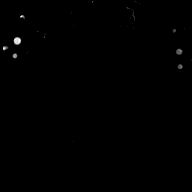

[Series 27: T1 · axial · 3.0mm · 1.15mm/px · 1 of 28 slices shown (18 of 48)]
[im 1/28]
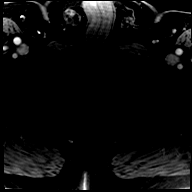

[Series 28: T1 · axial · 3.0mm · 1.15mm/px · 1 of 28 slices shown (19 of 48)]
[im 1/28]
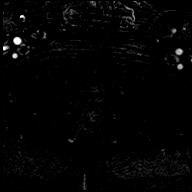

[Series 29: T1 · axial · 3.0mm · 1.15mm/px · 1 of 28 slices shown (20 of 48)]
[im 1/28]
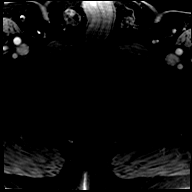

[Series 30: T1 · axial · 3.0mm · 1.15mm/px · 1 of 28 slices shown (21 of 48)]
[im 1/28]
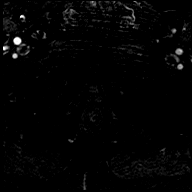

[Series 31: T1 · axial · 3.0mm · 1.15mm/px · 1 of 28 slices shown (22 of 48)]
[im 1/28]
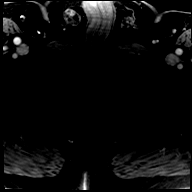

[Series 32: T1 · axial · 3.0mm · 1.15mm/px · 1 of 28 slices shown (23 of 48)]
[im 1/28]
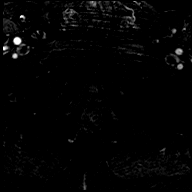

[Series 33: T1 · axial · 3.0mm · 1.15mm/px · 1 of 28 slices shown (24 of 48)]
[im 1/28]
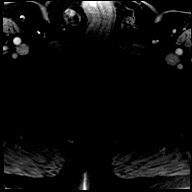

[Series 34: T1 · axial · 3.0mm · 1.15mm/px · 1 of 28 slices shown (25 of 48)]
[im 1/28]
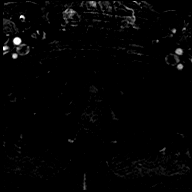

[Series 35: T1 · axial · 3.0mm · 1.15mm/px · 1 of 28 slices shown (26 of 48)]
[im 1/28]
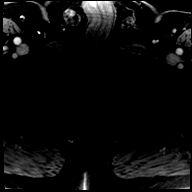

[Series 36: T1 · axial · 3.0mm · 1.15mm/px · 1 of 28 slices shown (27 of 48)]
[im 1/28]
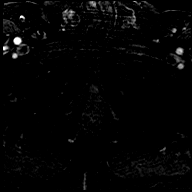

[Series 37: T1 · axial · 3.0mm · 1.15mm/px · 1 of 28 slices shown (28 of 48)]
[im 1/28]
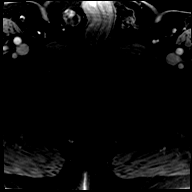

[Series 38: T1 · axial · 3.0mm · 1.15mm/px · 1 of 28 slices shown (29 of 48)]
[im 1/28]
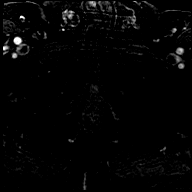

[Series 39: T1 · axial · 3.0mm · 1.15mm/px · 1 of 28 slices shown (30 of 48)]
[im 1/28]
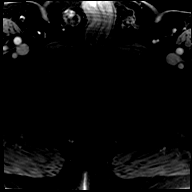

[Series 40: T1 · axial · 3.0mm · 1.15mm/px · 1 of 28 slices shown (31 of 48)]
[im 1/28]
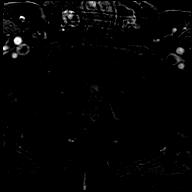

[Series 41: T1 · axial · 3.0mm · 1.15mm/px · 1 of 28 slices shown (32 of 48)]
[im 1/28]
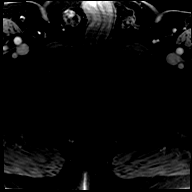

[Series 42: T1 · axial · 3.0mm · 1.15mm/px · 1 of 28 slices shown (33 of 48)]
[im 1/28]
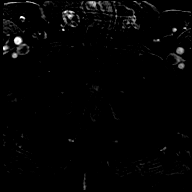

[Series 43: T1 · axial · 3.0mm · 1.15mm/px · 1 of 28 slices shown (34 of 48)]
[im 1/28]
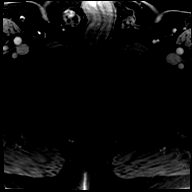

[Series 44: T1 · axial · 3.0mm · 1.15mm/px · 1 of 28 slices shown (35 of 48)]
[im 1/28]
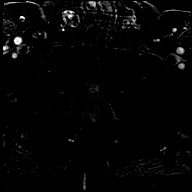

[Series 45: T1 · axial · 3.0mm · 1.15mm/px · 1 of 28 slices shown (36 of 48)]
[im 1/28]
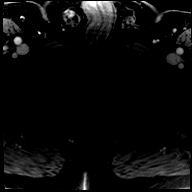

[Series 46: T1 · axial · 3.0mm · 1.15mm/px · 1 of 28 slices shown (37 of 48)]
[im 1/28]
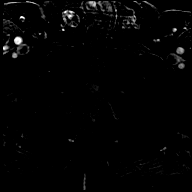

[Series 47: T1 · axial · 3.0mm · 1.15mm/px · 1 of 28 slices shown (38 of 48)]
[im 1/28]
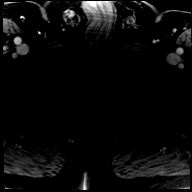

[Series 48: T1 · axial · 3.0mm · 1.15mm/px · 1 of 28 slices shown (39 of 48)]
[im 1/28]
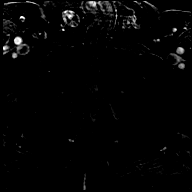

[Series 49: T1 · axial · 3.0mm · 1.15mm/px · 1 of 28 slices shown (40 of 48)]
[im 1/28]
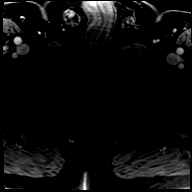

[Series 50: T1 · axial · 3.0mm · 1.15mm/px · 1 of 28 slices shown (41 of 48)]
[im 1/28]
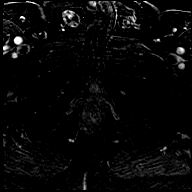

[Series 51: T1 · axial · 3.0mm · 1.15mm/px · 1 of 28 slices shown (42 of 48)]
[im 1/28]
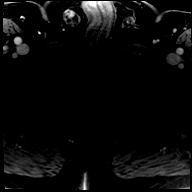

[Series 52: T1 · axial · 3.0mm · 1.15mm/px · 1 of 28 slices shown (43 of 48)]
[im 1/28]
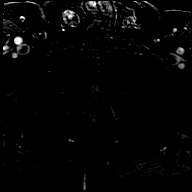

[Series 53: T1 · axial · 3.0mm · 1.15mm/px · 1 of 28 slices shown (44 of 48)]
[im 1/28]
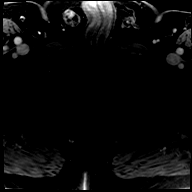

[Series 54: T1 · axial · 3.0mm · 1.15mm/px · 1 of 28 slices shown (45 of 48)]
[im 1/28]
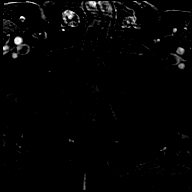

[Series 55: T1 · axial · 3.0mm · 1.15mm/px · 1 of 28 slices shown (46 of 48)]
[im 1/28]
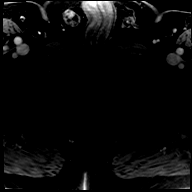

[Series 56: T1 · axial · 3.0mm · 1.15mm/px · 1 of 28 slices shown (47 of 48)]
[im 1/28]
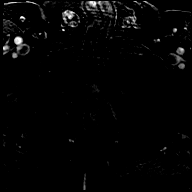

[Series 57: T1 · axial · 3.0mm · 1.15mm/px · 1 of 28 slices shown (48 of 48)]
[im 1/28]
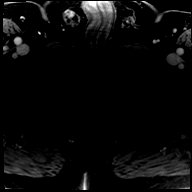

[56 of 56 positions shown; findings below may reference images not displayed]

FINDINGS: Despite efforts by the technologist and patient, motion artifact is
present on today's exam and could not be eliminated. This reduces
exam sensitivity and specificity.

Metal artifact from the left hip implant substantially degrades the
diffusion weighted sequences especially in the left side of the
prostate gland.

Prostate:

Region of interest # 1: PI-RADS category 3 lesion of the left
anterior and left posterolateral peripheral zone at the apex with
indistinct reduced T2 signal in the peripheral zone but no
substantial focal early enhancement or restriction of diffusion.
This measures 0.52 cubic cm (2.3 by 0.4 by 0.7 cm) and is shown for
example on image 54 series 9 and image 23 series 5.

Region of interest # 2: PI-RADS category 3 lesion of the right
anterior and right posterolateral peripheral zone at the apex, with
indistinct reduced T2 signal but no focal early enhancement or
restricted diffusion. This measures 0.27 cubic cm (1.7 by 0.5 by
cm) and is shown for example on image 55 series 9.

Encapsulated nodularity in the transition zone compatible with
benign prostatic hypertrophy. This indents the bladder base. Site of
prior TURP noted.

Volume: 3D volumetric analysis: Prostate volume 87.07 cubic cm (6.2
by 5.6 by 5.8 cm).

Transcapsular spread:  Absent

Seminal vesicle involvement: Absent

Neurovascular bundle involvement: Absent

Pelvic adenopathy: Absent

Bone metastasis: Absent

Other findings: Postoperative findings with metal artifact in the
left hip. Suspected prior left groin hernia repair.
IMPRESSION: 1. Two PI-RADS category 3 lesions are present in the peripheral
zone, one in the left anterior and left posterolateral peripheral
zone at the apex and the other in the right anterior and
posterolateral peripheral zone at the apex. Targeting data sent to.
2. Benign prostatic hypertrophy, prostate volume 87.07 cubic cm.
Site of prior TURP noted.
3. Metal artifact from the left hip implant substantially degrades
the diffusion weighted sequences especially in the left side of the
prostate gland.

## 2020-04-08 MED ORDER — GADOBUTROL 1 MMOL/ML IV SOLN
10.0000 mL | Freq: Once | INTRAVENOUS | Status: AC | PRN
  Administered 2020-04-08: 10 mL via INTRAVENOUS

## 2020-04-09 ENCOUNTER — Other Ambulatory Visit: Payer: Self-pay | Admitting: Family Medicine

## 2020-04-09 DIAGNOSIS — M25562 Pain in left knee: Secondary | ICD-10-CM

## 2020-04-12 ENCOUNTER — Other Ambulatory Visit: Payer: Self-pay

## 2020-04-12 ENCOUNTER — Ambulatory Visit
Admission: RE | Admit: 2020-04-12 | Discharge: 2020-04-12 | Disposition: A | Discharge status: Home / Self Care | Payer: Medicare HMO | Source: Ambulatory Visit | Attending: Family Medicine | Admitting: Family Medicine

## 2020-04-12 DIAGNOSIS — M25562 Pain in left knee: Secondary | ICD-10-CM | POA: Insufficient documentation

## 2020-04-12 IMAGING — US US EXTREM LOW VENOUS*L*
1 series · 13 of 24 positions shown · non-contrast
Comparison: None.

CLINICAL DATA: Left lower extremity pain. History of previous DVT
and pulmonary embolism. Evaluate for acute or chronic DVT.



[Series 1: us extrem low venous*left* · 0.09mm/px · 13 of 33 slices shown]
[im 1/33]
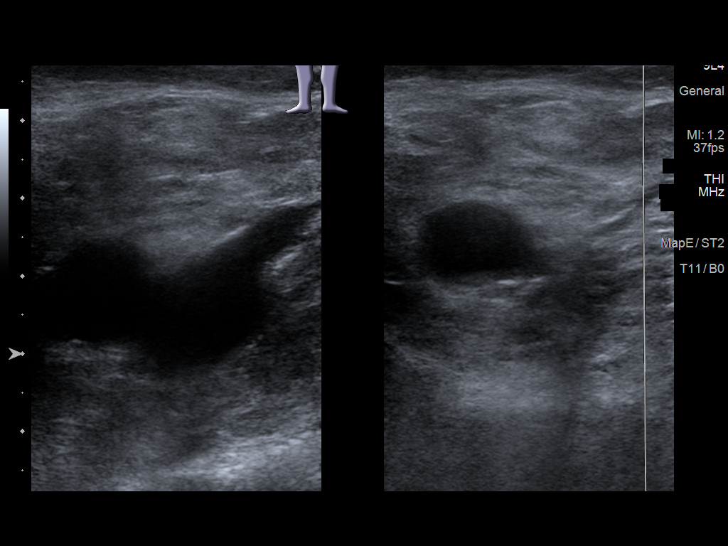
[im 3/33]
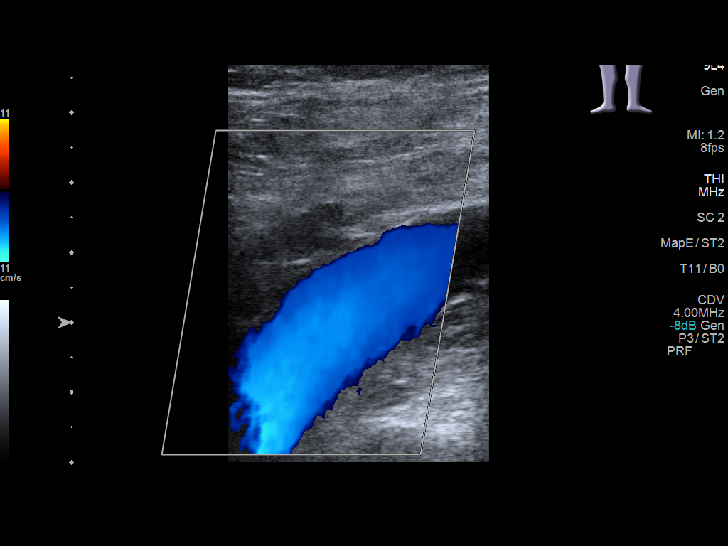
[im 6/33]
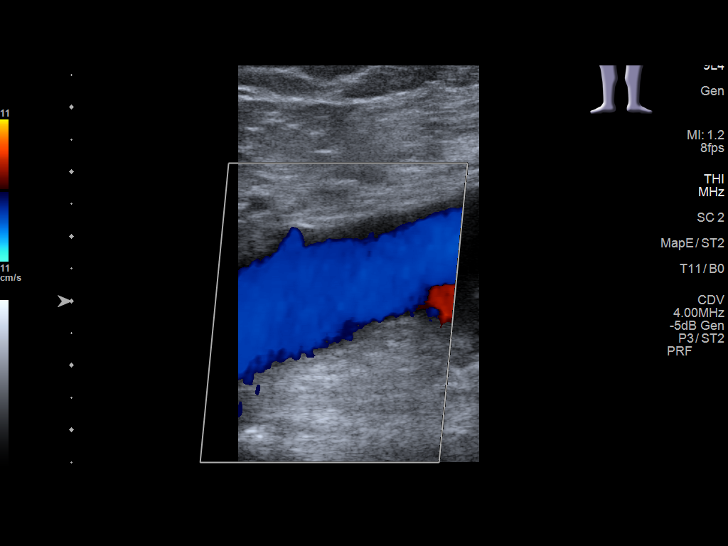
[im 9/33]
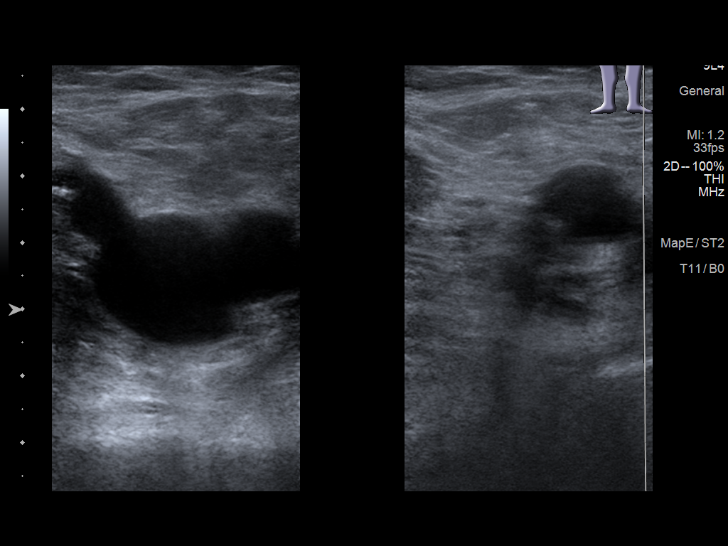
[im 12/33]
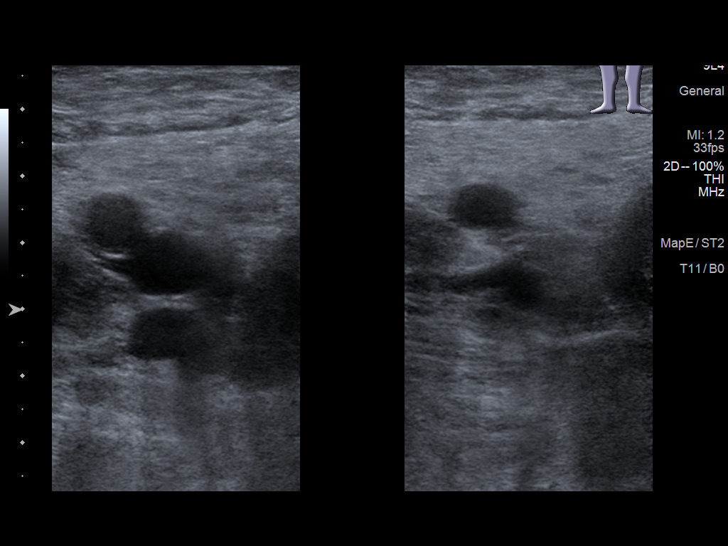
[im 14/33]
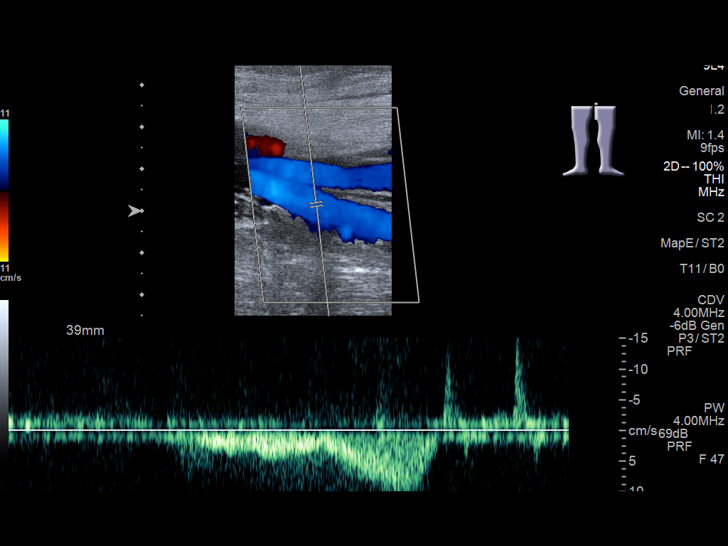
[im 17/33]
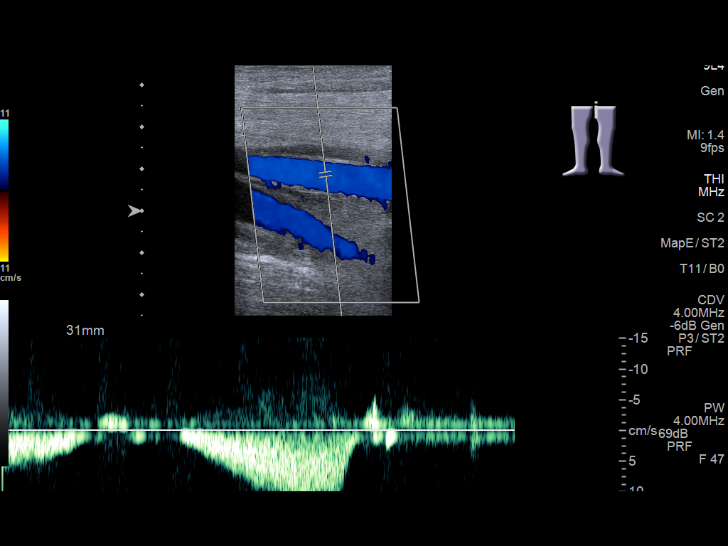
[im 19/33]
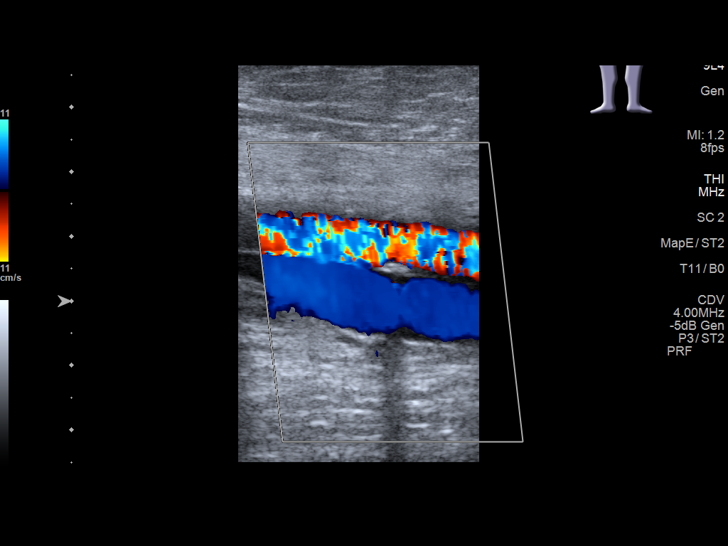
[im 21/33]
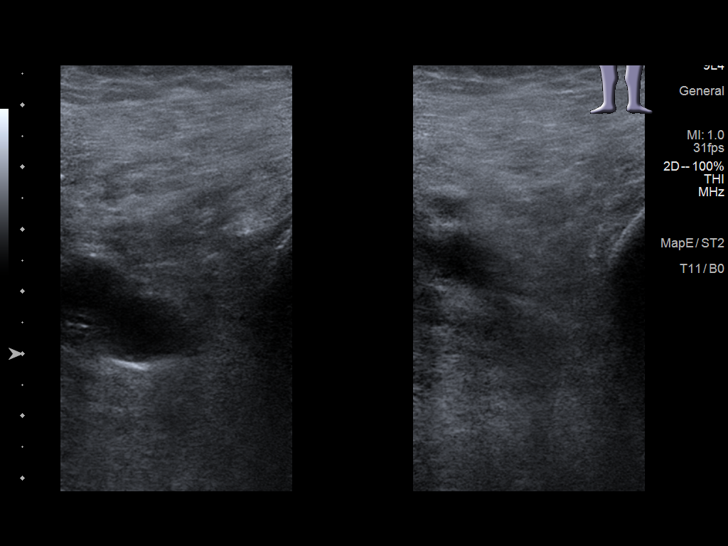
[im 24/33]
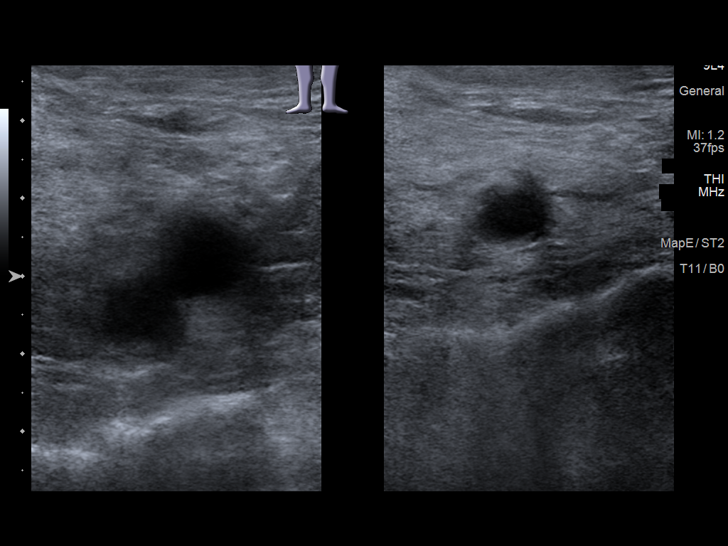
[im 27/33]
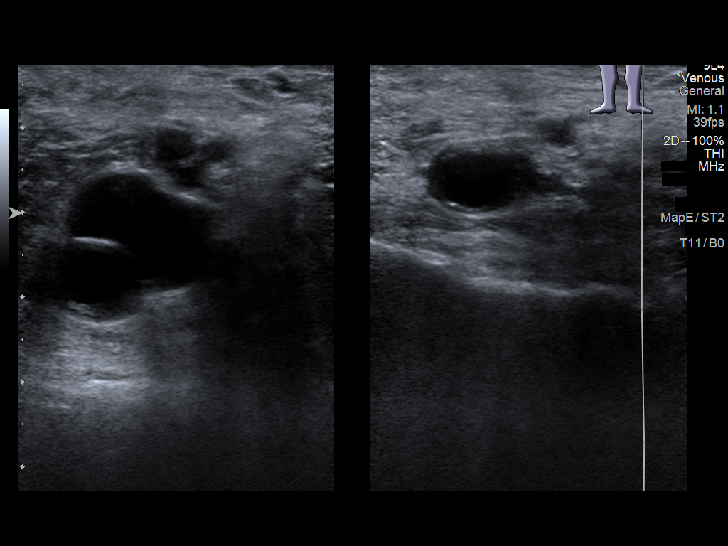
[im 30/33]
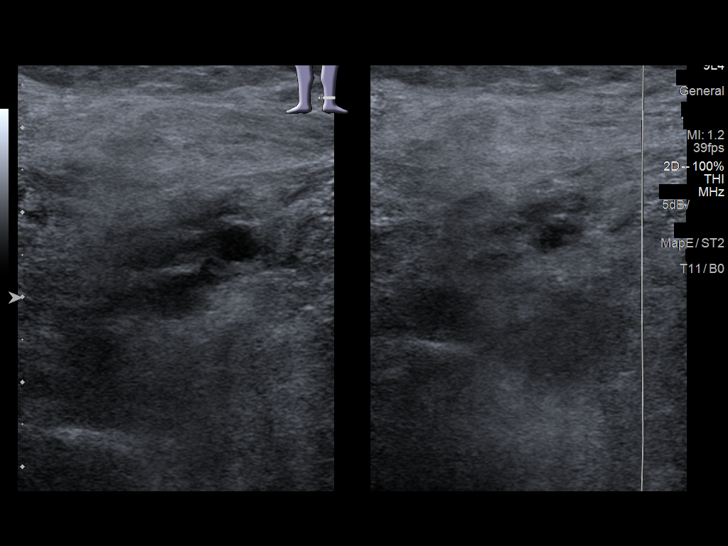
[im 33/33]
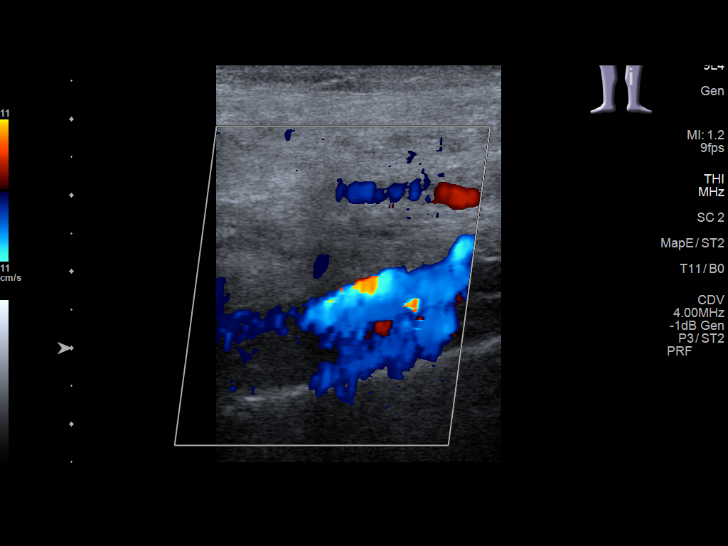

[13 of 24 positions shown; findings below may reference images not displayed]

FINDINGS: Contralateral Common Femoral Vein: Respiratory phasicity is normal
and symmetric with the symptomatic side. No evidence of thrombus.
Normal compressibility.

Common Femoral Vein: No evidence of thrombus. Normal
compressibility, respiratory phasicity and response to augmentation.

Saphenofemoral Junction: No evidence of thrombus. Normal
compressibility and flow on color Doppler imaging.

Profunda Femoral Vein: No evidence of thrombus. Normal
compressibility and flow on color Doppler imaging.

Femoral Vein: No evidence of thrombus. Normal compressibility,
respiratory phasicity and response to augmentation.

Popliteal Vein: No evidence of thrombus. Normal compressibility,
respiratory phasicity and response to augmentation.

Calf Veins: No evidence of thrombus. Normal compressibility and flow
on color Doppler imaging.

Superficial Great Saphenous Vein: No evidence of thrombus. Normal
compressibility.

Venous Reflux:  None.

Other Findings:  None.
IMPRESSION: No evidence of acute or chronic DVT within the left lower extremity.

## 2020-05-12 ENCOUNTER — Other Ambulatory Visit: Payer: Self-pay

## 2020-05-12 ENCOUNTER — Ambulatory Visit: Payer: Medicare HMO | Admitting: Dermatology

## 2020-05-12 DIAGNOSIS — L820 Inflamed seborrheic keratosis: Secondary | ICD-10-CM | POA: Diagnosis not present

## 2020-05-12 DIAGNOSIS — B351 Tinea unguium: Secondary | ICD-10-CM

## 2020-05-12 MED ORDER — TERBINAFINE HCL 250 MG PO TABS: 250.0000 mg | ORAL_TABLET | Freq: Every day | ORAL | 1 refills | Status: DC

## 2020-05-12 NOTE — Progress Notes (Signed)
   Follow-Up Visit   Subjective  Daniel Mason is a 75 y.o. male who presents for the following: Tine pedis/unguium (Bil feet toenails, 37m f/u Lamisil 250mg  1 po qd x 35m, ciclopirox cr qohs, pt hasnt had any side effects from Lamisil) and check ISK txted at last visit (Back, not healed).  The following portions of the chart were reviewed this encounter and updated as appropriate:   Tobacco  Allergies  Meds  Problems  Med Hx  Surg Hx  Fam Hx     Review of Systems:  No other skin or systemic complaints except as noted in HPI or Assessment and Plan.  Objective  Well appearing patient in no apparent distress; mood and affect are within normal limits.  A focused examination was performed including bil feet/toenails. Relevant physical exam findings are noted in the Assessment and Plan.  Objective  bil feet toenials: Toenail dystrophy  Objective  Right sup scapular x 1: Erythematous keratotic or waxy stuck-on papule or plaque.    Assessment & Plan  Tinea unguium bil feet toenials /Tinea Pedis Chronic and persistent but improved on oral systemic antifungal tx. No side effects. Cont Ciclopirox cr qohs to feet Cont Terbinafine 250mg  1 po qd #30 1rf  Terbinafine Counseling  Terbinafine is an anti-fungal medicine that can be applied to the skin (over the counter) or taken by mouth (prescription) to treat fungal infections. The pill version is often used to treat fungal infections of the nails or scalp. While most people do not have any side effects from taking terbinafine pills, some possible side effects of the medicine can include taste changes, headache, loss of smell, vision changes, nausea, vomiting, or diarrhea.   Rare side effects can include irritation of the liver, allergic reaction, or decrease in blood counts (which may show up as not feeling well or developing an infection). If you are concerned about any of these side effects, please stop the medicine and call your  doctor, or in the case of an emergency such as feeling very unwell, seek immediate medical care.    Reordered Medications terbinafine (LAMISIL) 250 MG tablet  Inflamed seborrheic keratosis Right sup scapular x 1  Destruction of lesion - Right sup scapular x 1 Complexity: simple   Destruction method: cryotherapy   Informed consent: discussed and consent obtained   Timeout:  patient name, date of birth, surgical site, and procedure verified Lesion destroyed using liquid nitrogen: Yes   Region frozen until ice ball extended beyond lesion: Yes   Outcome: patient tolerated procedure well with no complications   Post-procedure details: wound care instructions given    Return in about 8 months (around 01/11/2021) for Tinea pedis/unguium.  I, [name], RMA, am acting as scribe for Sarina Ser, MD .  Documentation: I have reviewed the above documentation for accuracy and completeness, and I agree with the above.  Sarina Ser, MD

## 2020-05-12 NOTE — Patient Instructions (Signed)

## 2020-05-18 ENCOUNTER — Encounter: Payer: Self-pay | Admitting: Dermatology

## 2020-12-01 ENCOUNTER — Encounter: Payer: Self-pay | Admitting: Gastroenterology

## 2020-12-02 ENCOUNTER — Ambulatory Visit
Admission: RE | Admit: 2020-12-02 | Discharge: 2020-12-02 | Disposition: A | Discharge status: Home / Self Care | Payer: Medicare HMO | Attending: Gastroenterology | Admitting: Gastroenterology

## 2020-12-02 ENCOUNTER — Ambulatory Visit: Payer: Medicare HMO | Admitting: Registered Nurse

## 2020-12-02 ENCOUNTER — Encounter
Admission: RE | Disposition: A | Discharge status: Home / Self Care | Payer: Self-pay | Source: Home / Self Care | Attending: Gastroenterology

## 2020-12-02 ENCOUNTER — Encounter: Payer: Self-pay | Admitting: Gastroenterology

## 2020-12-02 ENCOUNTER — Other Ambulatory Visit: Payer: Self-pay

## 2020-12-02 DIAGNOSIS — Z91041 Radiographic dye allergy status: Secondary | ICD-10-CM | POA: Diagnosis not present

## 2020-12-02 DIAGNOSIS — Z79899 Other long term (current) drug therapy: Secondary | ICD-10-CM | POA: Diagnosis not present

## 2020-12-02 DIAGNOSIS — Z888 Allergy status to other drugs, medicaments and biological substances status: Secondary | ICD-10-CM | POA: Insufficient documentation

## 2020-12-02 DIAGNOSIS — Z882 Allergy status to sulfonamides status: Secondary | ICD-10-CM | POA: Insufficient documentation

## 2020-12-02 DIAGNOSIS — K2100 Gastro-esophageal reflux disease with esophagitis, without bleeding: Secondary | ICD-10-CM | POA: Diagnosis not present

## 2020-12-02 DIAGNOSIS — Z86718 Personal history of other venous thrombosis and embolism: Secondary | ICD-10-CM | POA: Diagnosis not present

## 2020-12-02 DIAGNOSIS — Z1211 Encounter for screening for malignant neoplasm of colon: Secondary | ICD-10-CM | POA: Insufficient documentation

## 2020-12-02 DIAGNOSIS — K317 Polyp of stomach and duodenum: Secondary | ICD-10-CM | POA: Diagnosis not present

## 2020-12-02 DIAGNOSIS — K2970 Gastritis, unspecified, without bleeding: Secondary | ICD-10-CM | POA: Insufficient documentation

## 2020-12-02 DIAGNOSIS — Z8719 Personal history of other diseases of the digestive system: Secondary | ICD-10-CM | POA: Insufficient documentation

## 2020-12-02 DIAGNOSIS — D490 Neoplasm of unspecified behavior of digestive system: Secondary | ICD-10-CM | POA: Insufficient documentation

## 2020-12-02 DIAGNOSIS — Z7951 Long term (current) use of inhaled steroids: Secondary | ICD-10-CM | POA: Diagnosis not present

## 2020-12-02 DIAGNOSIS — Z7982 Long term (current) use of aspirin: Secondary | ICD-10-CM | POA: Insufficient documentation

## 2020-12-02 DIAGNOSIS — K5909 Other constipation: Secondary | ICD-10-CM | POA: Insufficient documentation

## 2020-12-02 DIAGNOSIS — Z86711 Personal history of pulmonary embolism: Secondary | ICD-10-CM | POA: Diagnosis not present

## 2020-12-02 DIAGNOSIS — Z791 Long term (current) use of non-steroidal anti-inflammatories (NSAID): Secondary | ICD-10-CM | POA: Insufficient documentation

## 2020-12-02 DIAGNOSIS — D120 Benign neoplasm of cecum: Secondary | ICD-10-CM | POA: Diagnosis not present

## 2020-12-02 HISTORY — DX: Personal history of other diseases of the nervous system and sense organs: Z86.69

## 2020-12-02 HISTORY — DX: Other chronic pain: G89.29

## 2020-12-02 HISTORY — DX: Tremor, unspecified: R25.1

## 2020-12-02 HISTORY — PX: COLONOSCOPY WITH PROPOFOL: SHX5780

## 2020-12-02 HISTORY — DX: Personal history of pulmonary embolism: Z86.711

## 2020-12-02 HISTORY — DX: Disease of gallbladder, unspecified: K82.9

## 2020-12-02 HISTORY — DX: Personal history of other diseases of male genital organs: Z87.438

## 2020-12-02 HISTORY — PX: ESOPHAGOGASTRODUODENOSCOPY (EGD) WITH PROPOFOL: SHX5813

## 2020-12-02 HISTORY — DX: Allergy, unspecified, initial encounter: T78.40XA

## 2020-12-02 SURGERY — ESOPHAGOGASTRODUODENOSCOPY (EGD) WITH PROPOFOL
Anesthesia: General

## 2020-12-02 MED ORDER — FENTANYL CITRATE (PF) 100 MCG/2ML IJ SOLN
INTRAMUSCULAR | Status: DC | PRN
  Administered 2020-12-02 (×4): 25 ug via INTRAVENOUS

## 2020-12-02 MED ORDER — PROPOFOL 500 MG/50ML IV EMUL
INTRAVENOUS | Status: AC
  Filled 2020-12-02: qty 50

## 2020-12-02 MED ORDER — SODIUM CHLORIDE 0.9 % IV SOLN: INTRAVENOUS | Status: DC

## 2020-12-02 MED ORDER — FENTANYL CITRATE (PF) 100 MCG/2ML IJ SOLN
INTRAMUSCULAR | Status: AC
  Filled 2020-12-02: qty 2

## 2020-12-02 MED ORDER — PROPOFOL 10 MG/ML IV BOLUS
INTRAVENOUS | Status: DC | PRN
  Administered 2020-12-02: 20 mg via INTRAVENOUS
  Administered 2020-12-02: 30 mg via INTRAVENOUS
  Administered 2020-12-02: 70 mg via INTRAVENOUS

## 2020-12-02 MED ORDER — PROPOFOL 500 MG/50ML IV EMUL
INTRAVENOUS | Status: DC | PRN
  Administered 2020-12-02: 120 ug/kg/min via INTRAVENOUS

## 2020-12-02 MED ORDER — EPHEDRINE SULFATE 50 MG/ML IJ SOLN
INTRAMUSCULAR | Status: DC | PRN
  Administered 2020-12-02: 10 mg via INTRAVENOUS
  Administered 2020-12-02: 5 mg via INTRAVENOUS
  Administered 2020-12-02: 10 mg via INTRAVENOUS

## 2020-12-02 MED ORDER — DEXMEDETOMIDINE HCL IN NACL 200 MCG/50ML IV SOLN
INTRAVENOUS | Status: AC
  Filled 2020-12-02: qty 50

## 2020-12-02 MED ORDER — DEXMEDETOMIDINE HCL IN NACL 200 MCG/50ML IV SOLN
INTRAVENOUS | Status: DC | PRN
  Administered 2020-12-02: 8 ug via INTRAVENOUS

## 2020-12-02 NOTE — Anesthesia Preprocedure Evaluation (Signed)
Anesthesia Evaluation  Patient identified by MRN, date of birth, ID band Patient awake    Airway Mallampati: III       Dental  (+) Teeth Intact   Pulmonary asthma ,     + decreased breath sounds      Cardiovascular Exercise Tolerance: Good hypertension, Pt. on medications  Rhythm:Regular Rate:Normal     Neuro/Psych negative neurological ROS  negative psych ROS   GI/Hepatic Neg liver ROS, GERD  ,  Endo/Other  negative endocrine ROS  Renal/GU negative Renal ROS  negative genitourinary   Musculoskeletal  (+) Arthritis ,   Abdominal Normal abdominal exam  (+)   Peds negative pediatric ROS (+)  Hematology negative hematology ROS (+)   Anesthesia Other Findings   Reproductive/Obstetrics negative OB ROS                             Anesthesia Physical Anesthesia Plan  ASA: 3  Anesthesia Plan: General   Post-op Pain Management:    Induction: Intravenous  PONV Risk Score and Plan:   Airway Management Planned: Nasal Cannula  Additional Equipment:   Intra-op Plan:   Post-operative Plan:   Informed Consent: I have reviewed the patients History and Physical, chart, labs and discussed the procedure including the risks, benefits and alternatives for the proposed anesthesia with the patient or authorized representative who has indicated his/her understanding and acceptance.     Dental Advisory Given  Plan Discussed with: CRNA and Surgeon  Anesthesia Plan Comments: (Patient consented for risks of anesthesia including but not limited to:  - adverse reactions to medications - risk of airway placement if required - damage to eyes, teeth, lips or other oral mucosa - nerve damage due to positioning  - sore throat or hoarseness - Damage to heart, brain, nerves, lungs, other parts of body or loss of life  Patient voiced understanding.)        Anesthesia Quick Evaluation

## 2020-12-02 NOTE — Interval H&P Note (Signed)
History and Physical Interval Note: Preprocedure H&P from 12/02/20  was reviewed and there was no interval change after seeing and examining the patient.  Written consent was obtained from the patient after discussion of risks, benefits, and alternatives. Patient has consented to proceed with Esophagogastroduodenoscopy and Colonoscopy with possible intervention   12/02/2020 9:54 AM  Daniel Mason  has presented today for surgery, with the diagnosis of Barrett's esophagus K22.70, Screening Z12.11.  The various methods of treatment have been discussed with the patient and family. After consideration of risks, benefits and other options for treatment, the patient has consented to  Procedure(s): ESOPHAGOGASTRODUODENOSCOPY (EGD) WITH PROPOFOL (N/A) COLONOSCOPY WITH PROPOFOL (N/A) as a surgical intervention.  The patient's history has been reviewed, patient examined, no change in status, stable for surgery.  I have reviewed the patient's chart and labs.  Questions were answered to the patient's satisfaction.     Annamaria Helling

## 2020-12-02 NOTE — Transfer of Care (Signed)
Immediate Anesthesia Transfer of Care Note  Patient: Jeral Zick  Procedure(s) Performed: ESOPHAGOGASTRODUODENOSCOPY (EGD) WITH PROPOFOL COLONOSCOPY WITH PROPOFOL  Patient Location: PACU  Anesthesia Type:MAC  Level of Consciousness: awake  Airway & Oxygen Therapy: Patient Spontanous Breathing  Post-op Assessment: Report given to RN and Post -op Vital signs reviewed and stable  Post vital signs: stable  Last Vitals:  Vitals Value Taken Time  BP 128/75 12/02/20 1051  Temp    Pulse 94 12/02/20 1052  Resp 18 12/02/20 1052  SpO2 98 % 12/02/20 1052  Vitals shown include unvalidated device data.  Last Pain:  Vitals:   12/02/20 0850  TempSrc: Temporal  PainSc: 0-No pain         Complications: No notable events documented.

## 2020-12-02 NOTE — Op Note (Signed)
[facility] Patient Name: Daniel Mason Procedure Date: 12/02/2020 9:49 AM MRN: [id number] Account #: [id number] Date of Birth: 06/11/1945 Admit Type: Outpatient Age: 75 Room: [facility] ENDO ROOM 1 Gender: Male Note Status: Finalized Instrument Name: Upper Endoscope [id number] Procedure:             Upper GI endoscopy Indications:           Surveillance for malignancy due to personal history of                         Barrett's esophagus Providers:             Rueben Bash, DO Referring MD:          Youlanda Roys. Lovie Macadamia, MD (Referring MD) Medicines:             Monitored Anesthesia Care Complications:         No immediate complications. Estimated blood loss:                         Minimal. Procedure:             Pre-Anesthesia Assessment:                        - Prior to the procedure, a History and Physical was                         performed, and patient medications and allergies were                         reviewed. The patient is competent. The risks and                         benefits of the procedure and the sedation options and                         risks were discussed with the patient. All questions                         were answered and informed consent was obtained.                         Patient identification and proposed procedure were                         verified by the physician, the nurse, the anesthetist                         and the technician in the endoscopy suite. Mental                         Status Examination: alert and oriented. Airway                         Examination: normal oropharyngeal airway and neck                         mobility. Respiratory Examination: clear to  auscultation. CV Examination: RRR, no murmurs, no S3                         or S4. Prophylactic Antibiotics: The patient does not                         require prophylactic antibiotics. Prior                          Anticoagulants: The patient has taken no previous                         anticoagulant or antiplatelet agents. ASA Grade                         Assessment: III - A patient with severe systemic                         disease. After reviewing the risks and benefits, the                         patient was deemed in satisfactory condition to                         undergo the procedure. The anesthesia plan was to use                         monitored anesthesia care (MAC). Immediately prior to                         administration of medications, the patient was                         re-assessed for adequacy to receive sedatives. The                         heart rate, respiratory rate, oxygen saturations,                         blood pressure, adequacy of pulmonary ventilation, and                         response to care were monitored throughout the                         procedure. The physical status of the patient was                         re-assessed after the procedure.                        After obtaining informed consent, the endoscope was                         passed under direct vision. Throughout the procedure,                         the patient's blood pressure, pulse, and oxygen  saturations were monitored continuously. The Endoscope                         was introduced through the mouth, and advanced to the                         second part of duodenum. The upper GI endoscopy was                         accomplished without difficulty. The patient tolerated                         the procedure well. Findings:      The ampulla, duodenal bulb, first portion of the duodenum and second       portion of the duodenum were normal. Estimated blood loss: none.      Localized mild inflammation characterized by erythema was found in the       gastric antrum. Biopsies were taken with a cold forceps for Helicobacter       pylori  testing. Estimated blood loss was minimal.      A single 2 to 3 mm sessile polyp with no bleeding and no stigmata of       recent bleeding was found in the gastric antrum. The polyp was removed       with a cold biopsy forceps. Resection and retrieval were complete.       Estimated blood loss was minimal.      A small, submucosal, non-circumferential mass with no bleeding and no       stigmata of recent bleeding was found in the gastric antrum. Biopsies       were taken with a cold forceps for histology. Estimated blood loss was       minimal. Double bite biopsy with cold forceps      Esophagogastric landmarks were identified: the gastroesophageal junction       was found at 39 cm from the incisors.      There were esophageal mucosal changes consistent with short-segment       Barrett's esophagus, classified as Barrett's stage C2-M3 per [place]       criteria present in the distal esophagus. The maximum longitudinal       extent of these mucosal changes was 3 cm in length. Mucosa was biopsied       with a cold forceps for histology. A total of 2 specimen bottles were       sent to pathology. Estimated blood loss was minimal.      The exam of the esophagus was otherwise normal. Impression:            - Normal ampulla, duodenal bulb, first portion of the                         duodenum and second portion of the duodenum.                        - Gastritis. Biopsied.                        - A single gastric polyp. Resected and retrieved.                        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                        - Esophagogastric landmarks identified.                        - Esophageal mucosal changes consistent with                         short-segment Barrett's esophagus, classified as                         Barrett's stage C2-M3 per [place] criteria. Biopsied. Recommendation:        - Discharge patient to home.                        - Resume previous diet.                         - Use a proton pump inhibitor continue current.                        - Await pathology results.                        - Repeat upper endoscopy for surveillance based on                         pathology results.                        - Return to referring physician as previously                         scheduled. Procedure Code(s):     --- Professional ---                        [phone], Esophagogastroduodenoscopy, flexible,                         transoral; with biopsy, single or multiple Diagnosis Code(s):     --- Professional ---                        K22.70, Barrett's esophagus without dysplasia                        K29.70, Gastritis, unspecified, without bleeding                        K31.7, Polyp of stomach and duodenum                        D49.0, Neoplasm of unspecified behavior of digestive                         system CPT copyright 2019 American [employer]. All rights reserved. The codes documented in this report are preliminary and upon coder review may  be revised to meet current compliance requirements. Attending Participation:      I personally performed the entire procedure. Volney American, DO Annamaria Helling DO, DO 12/02/2020  10:59:22 AM This report has been signed electronically. Number of Addenda: 0 Note Initiated On: 12/02/2020 9:49 AM Estimated Blood Loss:  Estimated blood loss was minimal.      [facility]

## 2020-12-02 NOTE — Anesthesia Postprocedure Evaluation (Signed)
Anesthesia Post Note  Patient: Daniel Mason  Procedure(s) Performed: ESOPHAGOGASTRODUODENOSCOPY (EGD) WITH PROPOFOL COLONOSCOPY WITH PROPOFOL  Patient location during evaluation: PACU Anesthesia Type: General Level of consciousness: awake and oriented Pain management: satisfactory to patient Respiratory status: spontaneous breathing and respiratory function stable Cardiovascular status: blood pressure returned to baseline Anesthetic complications: no   No notable events documented.   Last Vitals:  Vitals:   12/02/20 1100 12/02/20 1110  BP: 137/82 137/82  Pulse: 92 79  Resp: 13 15  Temp:    SpO2: 98% 99%    Last Pain:  Vitals:   12/02/20 1050  TempSrc: Temporal  PainSc:                  VAN STAVEREN,Advit Trethewey

## 2020-12-02 NOTE — Op Note (Addendum)
[facility] Patient Name: Daniel Mason Procedure Date: 12/02/2020 9:48 AM MRN: [id number] Account #: [id number] Date of Birth: 1945/06/01 Admit Type: Outpatient Age: 75 Room: [facility] ENDO ROOM 1 Gender: Male Note Status: Supervisor Override Instrument Name: Jasper Riling [id number] Procedure:             Colonoscopy Indications:           Screening for colorectal malignant neoplasm Providers:             Rueben Bash, DO Referring MD:          Youlanda Roys. Lovie Macadamia, MD (Referring MD) Medicines:             Monitored Anesthesia Care Complications:         No immediate complications. Estimated blood loss:                         Minimal. Procedure:             Pre-Anesthesia Assessment:                        - Prior to the procedure, a History and Physical was                         performed, and patient medications and allergies were                         reviewed. The patient is competent. The risks and                         benefits of the procedure and the sedation options and                         risks were discussed with the patient. All questions                         were answered and informed consent was obtained.                         Patient identification and proposed procedure were                         verified by the physician, the nurse, the anesthetist                         and the technician in the endoscopy suite. Mental                         Status Examination: alert and oriented. Airway                         Examination: normal oropharyngeal airway and neck                         mobility. Respiratory Examination: clear to                         auscultation. CV Examination: RRR, no murmurs, no S3  or S4. Prophylactic Antibiotics: The patient does not                         require prophylactic antibiotics. Prior                         Anticoagulants: The patient has taken no  previous                         anticoagulant or antiplatelet agents. ASA Grade                         Assessment: III - A patient with severe systemic                         disease. After reviewing the risks and benefits, the                         patient was deemed in satisfactory condition to                         undergo the procedure. The anesthesia plan was to use                         monitored anesthesia care (MAC). Immediately prior to                         administration of medications, the patient was                         re-assessed for adequacy to receive sedatives. The                         heart rate, respiratory rate, oxygen saturations,                         blood pressure, adequacy of pulmonary ventilation, and                         response to care were monitored throughout the                         procedure. The physical status of the patient was                         re-assessed after the procedure.                        After obtaining informed consent, the colonoscope was                         passed under direct vision. Throughout the procedure,                         the patient's blood pressure, pulse, and oxygen                         saturations were monitored continuously. The  Colonoscope was introduced through the anus and                         advanced to the the terminal ileum, with                         identification of the appendiceal orifice and IC                         valve. The colonoscopy was performed without                         difficulty. The patient tolerated the procedure well.                         The quality of the bowel preparation was evaluated                         using the BBPS [facility] Bowel Preparation Scale) with                         scores of: Right Colon = 2 (minor amount of residual                         staining, small fragments of stool and/or opaque                          liquid, but mucosa seen well), Transverse Colon = 3                         (entire mucosa seen well with no residual staining,                         small fragments of stool or opaque liquid) and Left                         Colon = 3 (entire mucosa seen well with no residual                         staining, small fragments of stool or opaque liquid).                         The total BBPS score equals 8. The quality of the                         bowel preparation was excellent. Findings:      The perianal and digital rectal examinations were normal. Pertinent       negatives include normal sphincter tone.      The terminal ileum appeared normal.      A 7 to 8 mm polyp was found in the cecum. The polyp was sessile. The       polyp was removed with a hot snare. Resection and retrieval were       complete. Estimated blood loss was minimal.      The exam was otherwise without abnormality on direct and retroflexion       views. Impression:            -  The examined portion of the ileum was normal.                        - One 7 to 8 mm polyp in the cecum, removed with a hot                         snare. Resected and retrieved.                        - The examination was otherwise normal on direct and                         retroflexion views. Recommendation:        - Discharge patient to home.                        - Resume previous diet.                        - No aspirin, ibuprofen, naproxen, or other                         non-steroidal anti-inflammatory drugs for 5 days after                         polyp removal.                        - Continue present medications.                        - Await pathology results.                        - Repeat colonoscopy for surveillance based on                         pathology results.                        - Return to referring physician as previously                         scheduled. Procedure Code(s):     ---  Professional ---                        [phone], Colonoscopy, flexible; with removal of                         tumor(s), polyp(s), or other lesion(s) by snare                         technique Diagnosis Code(s):     --- Professional ---                        Z86.010, Personal history of colonic polyps                        K63.5, Polyp of colon CPT copyright 2019 American Medical Association. All rights reserved. The codes documented in this report are preliminary  and upon coder review may  be revised to meet current compliance requirements. Attending Participation:      I personally performed the entire procedure. Volney American, DO Annamaria Helling DO, DO 12/02/2020 10:51:32 AM This report has been signed electronically. Number of Addenda: 0 Note Initiated On: 12/02/2020 9:48 AM Scope Withdrawal Time: 0 hours 22 minutes 24 seconds  Total Procedure Duration: 0 hours 29 minutes 39 seconds  Estimated Blood Loss:  Estimated blood loss was minimal.      [facility]

## 2020-12-02 NOTE — H&P (Signed)
[facility] Pre-Procedure H&P   Patient ID: Daniel Mason is a 75 y.o. male.  Gastroenterology Provider: Annamaria Helling, DO  Referring Provider: Laurine Blazer, PA PCP: Juluis Pitch, MD  Date: 12/02/2020  HPI Daniel Mason is a 75 y.o. male who presents today for Esophagogastroduodenoscopy and Colonoscopy for Barretts esophagus surveillance and screening colonoscopy.  Patient deals with chronic constipation, but reports "not so bad." No blood or melena noted.  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 Has been on ppi. No dysphagia/odynophagia, weight changes, or appetite changes.  L hip replaced.  EGD/Colonoscopy 10-15 years ago  No other acute gi complaints.   Past Medical History:  Diagnosis Date   Allergy    Arthritis    Asthma    d/t a cough that he had for 30 years   Chronic bilateral low back pain with bilateral sciatica    DVT (deep venous thrombosis) ([place]) 04/2017   was on blood thinners until hematuria.  actually was a PE   Gallbladder disease    GERD (gastroesophageal reflux disease)    History of BPH    History of cataract    History of kidney stones    bladder stones and 2 small kidney stones   History of pulmonary embolus (PE)    Hypertension    Tremor     Past Surgical History:  Procedure Laterality Date   CHOLECYSTECTOMY  2010   CYSTOSCOPY WITH LITHOLAPAXY N/A 11/21/2018   Procedure: CYSTOSCOPY WITH LITHOLAPAXY;  Surgeon: Royston Cowper, MD;  Location: [facility];  Service: Urology;  Laterality: N/A;   EYE SURGERY Bilateral 2001   cataract extractions   GREEN LIGHT LASER TURP (TRANSURETHRAL RESECTION OF PROSTATE N/A 11/21/2018   Procedure: GREEN LIGHT LASER TURP (TRANSURETHRAL RESECTION OF PROSTATE;  Surgeon: Royston Cowper, MD;  Location: [facility];  Service: Urology;  Laterality: N/A;   HERNIA REPAIR Left 2010   inguinal   NASAL SINUS SURGERY  1997, 2015   x 2   PROSTATE SURGERY  2001, 2011   .  2nd surgery was a  turp   PROSTATOTOMY  11/21/2018   TOTAL HIP ARTHROPLASTY Left 2016    Family History No h/o GI disease or malignancy  Review of Systems  Constitutional:  Negative for activity change, appetite change, chills, fatigue, fever and unexpected weight change.  HENT:  Negative for trouble swallowing and voice change.   Respiratory:  Negative for shortness of breath.   Cardiovascular:  Negative for chest pain and palpitations.  Gastrointestinal:  Positive for constipation. Negative for abdominal distention, abdominal pain, anal bleeding, blood in stool, diarrhea, nausea and vomiting.       Reflux controlled on ppi  Musculoskeletal:  Negative for arthralgias and myalgias.  Skin:  Negative for color change and pallor.  Neurological:  Negative for dizziness, syncope and weakness.  Psychiatric/Behavioral:  Negative for confusion. The patient is not nervous/anxious.   All other systems reviewed and are negative.   Medications No current facility-administered medications on file prior to encounter.   Current Outpatient Medications on File Prior to Encounter  Medication Sig Dispense Refill   albuterol (VENTOLIN HFA) 108 (90 Base) MCG/ACT inhaler Inhale 2 puffs into the lungs every 6 (six) hours as needed for wheezing or shortness of breath.     aspirin EC 81 MG tablet Take 81 mg by mouth daily. Swallow whole.     Avanafil (STENDRA) 200 MG TABS Take by mouth.     beclomethasone (QVAR)  40 MCG/ACT inhaler Inhale 2 puffs into the lungs daily as needed (wheezing).      mepolizumab (NUCALA) 100 MG injection Inject 100 mg into the skin every 30 (thirty) days.      Multiple Vitamin (MULTIVITAMIN WITH MINERALS) TABS tablet Take 1 tablet by mouth daily.     NAPROXEN PO Take 550 mg by mouth every 12 (twelve) hours.     omeprazole (PRILOSEC) 40 MG capsule Take 40 mg by mouth at bedtime.     terbinafine (LAMISIL) 250 MG tablet Take 1 tablet (250 mg total) by mouth daily. 30 tablet 1   valsartan (DIOVAN) 40 MG  tablet Take 40 mg by mouth daily.     acidophilus (RISAQUAD) CAPS capsule Take 1 capsule by mouth daily. (Patient not taking: Reported on 01/07/2020)     bifidobacterium infantis (ALIGN) capsule Take 1 capsule by mouth daily.     ciclopirox (LOPROX) 0.77 % cream Apply 1 application topically daily. qhs to feet 60 g 11   ciprofloxacin (CIPRO) 500 MG tablet Take 1 tablet (500 mg total) by mouth 2 (two) times daily. (Patient not taking: Reported on 01/07/2020) 6 tablet 0   docusate sodium (COLACE) 100 MG capsule Take 2 capsules (200 mg total) by mouth 2 (two) times daily. 120 capsule 3   FINASTERIDE PO Take 5 mg by mouth daily. In the morning (Patient not taking: Reported on 01/07/2020)     ketoconazole (NIZORAL) 2 % cream Apply 1 application topically daily. qhs to feet 60 g 11   Meth-Hyo-M Bl-Na Phos-Ph Sal (URIBEL) 118 MG CAPS Take 1 capsule (118 mg total) by mouth every 6 (six) hours as needed (dysuria). (Patient not taking: Reported on 01/07/2020) 40 capsule 3   OVER THE COUNTER MEDICATION 1 tablet at bedtime as needed. Sleep medicine from [facility]     tamsulosin (FLOMAX) 0.4 MG CAPS capsule Take 0.4 mg by mouth at bedtime.  (Patient not taking: Reported on 01/07/2020)      Pertinent medications related to GI and procedure were reviewed by me with the patient prior to the procedure   Current Facility-Administered Medications:    0.9 %  sodium chloride infusion, , Intravenous, Continuous, Annamaria Helling, DO, Last Rate: 20 mL/hr at 12/02/20 6378, Continued from Pre-op at 12/02/20 0925  sodium chloride 20 mL/hr at 12/02/20 5885       Allergies  Allergen Reactions   Iodine Shortness Of Breath and Cough    Intravenous iodine.  No problem with betadine   Sulfa Antibiotics Hives and Swelling   Allergies were reviewed by me prior to the procedure  Objective    Vitals:   12/02/20 0850  BP: (!) 177/81  Pulse: 63  Resp: 16  Temp: (!) 97.2 F (36.2 C)  TempSrc: Temporal  SpO2: 99%   Weight: 91.6 kg  Height: 5\' 10"  (1.778 m)     Physical Exam Vitals and nursing note reviewed.  Constitutional:      General: He is not in acute distress.    Appearance: Normal appearance. He is not ill-appearing, toxic-appearing or diaphoretic.  HENT:     Head: Normocephalic and atraumatic.     Nose: Nose normal.     Mouth/Throat:     Mouth: Mucous membranes are moist.     Pharynx: Oropharynx is clear.  Eyes:     General: No scleral icterus.    Extraocular Movements: Extraocular movements intact.  Cardiovascular:     Rate and Rhythm: Normal rate and regular rhythm.  Heart sounds: Normal heart sounds. No murmur heard.   No friction rub. No gallop.  Pulmonary:     Effort: Pulmonary effort is normal. No respiratory distress.     Breath sounds: Normal breath sounds. No wheezing, rhonchi or rales.  Abdominal:     General: Bowel sounds are normal. There is no distension.     Palpations: Abdomen is soft.     Tenderness: There is no abdominal tenderness. There is no guarding or rebound.  Musculoskeletal:     Cervical back: Neck supple.     Right lower leg: No edema.     Left lower leg: No edema.  Skin:    General: Skin is warm and dry.     Coloration: Skin is not jaundiced or pale.  Neurological:     General: No focal deficit present.     Mental Status: He is alert and oriented to person, place, and time. Mental status is at baseline.  Psychiatric:        Mood and Affect: Mood normal.        Behavior: Behavior normal.        Thought Content: Thought content normal.        Judgment: Judgment normal.     Assessment:  Daniel Mason is a 75 y.o. male  who presents today for Esophagogastroduodenoscopy and Colonoscopy for Barrett's esophagus surveillance and screening colonoscopy.  Plan:  Esophagogastroduodenoscopy and Colonoscopy with possible intervention today  Esophagogastroduodenoscopy and colonoscopy with possible biopsy, control of bleeding, polypectomy, and  interventions as necessary has been discussed with the patient/patient representative. Informed consent was obtained from the patient/patient representative after explaining the indication, nature, and risks of the procedure including but not limited to death, bleeding, perforation, missed neoplasm/lesions, cardiorespiratory compromise, and reaction to medications. Opportunity for questions was given and appropriate answers were provided. Patient/patient representative has verbalized understanding is amenable to undergoing the procedure.   Annamaria Helling, DO  [facility]  Portions of the record may have been created with voice recognition software. Occasional wrong-word or 'sound-a-like' substitutions may have occurred due to the inherent limitations of voice recognition software.  Read the chart carefully and recognize, using context, where substitutions may have occurred.

## 2020-12-03 LAB — SURGICAL PATHOLOGY

## 2021-01-12 ENCOUNTER — Other Ambulatory Visit: Payer: Self-pay

## 2021-01-12 ENCOUNTER — Ambulatory Visit: Payer: Medicare HMO | Admitting: Dermatology

## 2021-01-12 DIAGNOSIS — L578 Other skin changes due to chronic exposure to nonionizing radiation: Secondary | ICD-10-CM | POA: Diagnosis not present

## 2021-01-12 DIAGNOSIS — L814 Other melanin hyperpigmentation: Secondary | ICD-10-CM

## 2021-01-12 DIAGNOSIS — L570 Actinic keratosis: Secondary | ICD-10-CM

## 2021-01-12 DIAGNOSIS — B353 Tinea pedis: Secondary | ICD-10-CM | POA: Diagnosis not present

## 2021-01-12 DIAGNOSIS — L820 Inflamed seborrheic keratosis: Secondary | ICD-10-CM | POA: Diagnosis not present

## 2021-01-12 DIAGNOSIS — D692 Other nonthrombocytopenic purpura: Secondary | ICD-10-CM

## 2021-01-12 DIAGNOSIS — B351 Tinea unguium: Secondary | ICD-10-CM

## 2021-01-12 DIAGNOSIS — Z1283 Encounter for screening for malignant neoplasm of skin: Secondary | ICD-10-CM

## 2021-01-12 DIAGNOSIS — L821 Other seborrheic keratosis: Secondary | ICD-10-CM

## 2021-01-12 MED ORDER — TERBINAFINE HCL 250 MG PO TABS: 250.0000 mg | ORAL_TABLET | Freq: Every day | ORAL | 0 refills | Status: DC

## 2021-01-12 NOTE — Patient Instructions (Signed)

## 2021-01-12 NOTE — Progress Notes (Signed)
Follow-Up Visit   Subjective  Daniel Mason is a 75 y.o. male who presents for the following: Tinea pedis/unguium (Bil feet/toenails, 68m f/u, 33m of Lamisil, Ciclopirox cr prn) and check scaly spots (Face, chest, ~74m, irritating, painful). The patient presents for Upper Body Skin Exam (UBSE) for skin cancer screening and mole check.  The patient has spots, moles and lesions to be evaluated, some may be new or changing and the patient has concerns that these could be cancer.  The following portions of the chart were reviewed this encounter and updated as appropriate:   Tobacco  Allergies  Meds  Problems  Med Hx  Surg Hx  Fam Hx     Review of Systems:  No other skin or systemic complaints except as noted in HPI or Assessment and Plan.  Objective  Well appearing patient in no apparent distress; mood and affect are within normal limits.  All skin waist up examined.  feet, toenails L great toenail thick but improved, fine scale feet  L temple x 2, chest x 5, Total = 7 (2) Erythematous keratotic or waxy stuck-on papule or plaque.   Right Ear x 2, R cheek x 2 (4) Pink scaly macules    Assessment & Plan   Lentigines - Scattered tan macules - Due to sun exposure - Benign-appearing, observe - Recommend daily broad spectrum sunscreen SPF 30+ to sun-exposed areas, reapply every 2 hours as needed. - Call for any changes  Seborrheic Keratoses - Stuck-on, waxy, tan-brown papules and/or plaques  - Benign-appearing - Discussed benign etiology and prognosis. - Observe - Call for any changes  Actinic Damage - Chronic condition, secondary to cumulative UV/sun exposure - diffuse scaly erythematous macules with underlying dyspigmentation - Recommend daily broad spectrum sunscreen SPF 30+ to sun-exposed areas, reapply every 2 hours as needed.  - Staying in the shade or wearing long sleeves, sun glasses (UVA+UVB protection) and wide brim hats (4-inch brim around the entire  circumference of the hat) are also recommended for sun protection.  - Call for new or changing lesions.  Skin cancer screening performed today.  Purpura - Chronic; persistent and recurrent.  Treatable, but not curable. - Violaceous macules and patches - Benign - Related to trauma, age, sun damage and/or use of blood thinners, chronic use of topical and/or oral steroids - Observe - Can use OTC arnica containing moisturizer such as Dermend Bruise Formula if desired - Call for worsening or other concerns  Tinea unguium feet, toenails With Tinea pedis Improved but not clear Chronic, persistent Recommend 1 more month of Lamisil 250mg  1 po qd Restart Ciclopirox cr qhs to feet and between toes Related Medications terbinafine (LAMISIL) 250 MG tablet Take 1 tablet (250 mg total) by mouth daily.  Inflamed seborrheic keratosis L temple x 2, chest x 5, Total = 7 Destruction of lesion - L temple x 2, chest x 5, Total = 7 Complexity: simple   Destruction method: cryotherapy   Informed consent: discussed and consent obtained   Timeout:  patient name, date of birth, surgical site, and procedure verified Lesion destroyed using liquid nitrogen: Yes   Region frozen until ice ball extended beyond lesion: Yes   Outcome: patient tolerated procedure well with no complications   Post-procedure details: wound care instructions given    AK (actinic keratosis) (4) Right Ear x 2, R cheek x 2 Destruction of lesion - Right Ear x 2, R cheek x 2 Complexity: simple   Destruction method: cryotherapy   Informed  consent: discussed and consent obtained   Timeout:  patient name, date of birth, surgical site, and procedure verified Lesion destroyed using liquid nitrogen: Yes   Region frozen until ice ball extended beyond lesion: Yes   Outcome: patient tolerated procedure well with no complications   Post-procedure details: wound care instructions given    Skin cancer screening  Return in about 6 months  (around 07/13/2021) for AK f/u, tinea pedis/unguium f/u.  I, Daniel Mason, RMA, am acting as scribe for Daniel Ser, MD . Documentation: I have reviewed the above documentation for accuracy and completeness, and I agree with the above.  Daniel Ser, MD

## 2021-01-20 ENCOUNTER — Other Ambulatory Visit (HOSPITAL_COMMUNITY): Payer: Self-pay | Admitting: Urology

## 2021-01-20 ENCOUNTER — Other Ambulatory Visit: Payer: Self-pay | Admitting: Urology

## 2021-01-20 DIAGNOSIS — N2889 Other specified disorders of kidney and ureter: Secondary | ICD-10-CM

## 2021-01-21 ENCOUNTER — Encounter: Payer: Self-pay | Admitting: Dermatology

## 2021-02-04 ENCOUNTER — Ambulatory Visit
Admission: RE | Admit: 2021-02-04 | Discharge: 2021-02-04 | Disposition: A | Discharge status: Home / Self Care | Payer: Medicare HMO | Source: Ambulatory Visit | Attending: Urology | Admitting: Urology

## 2021-02-04 DIAGNOSIS — N2889 Other specified disorders of kidney and ureter: Secondary | ICD-10-CM | POA: Insufficient documentation

## 2021-02-04 IMAGING — MR MR ABDOMEN WO/W CM
17 of 18 series · 43 of 48 positions shown · IV contrast (9ml Gadavist)
Comparison: None.
COMPARISON: None.

Addendum:
CLINICAL DATA: Characterize suspected duodenal or pancreatic cyst
identified by prior outside CT

EXAM:
MRI ABDOMEN WITHOUT AND WITH CONTRAST
TECHNIQUE: Multiplanar multisequence MR imaging of the abdomen was performed
both before and after the administration of intravenous contrast.
CONTRAST:  9mL GADAVIST GADOBUTROL 1 MMOL/ML IV SOLN

[Series 3: cor haste · coronal · 6.0mm · 1.19mm/px · 2 of 32 slices shown]
[im 1/32]
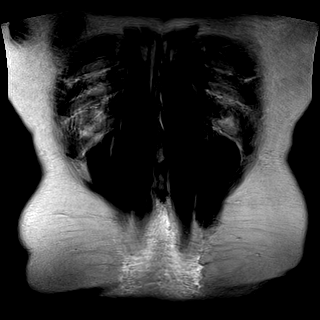
[im 32/32]
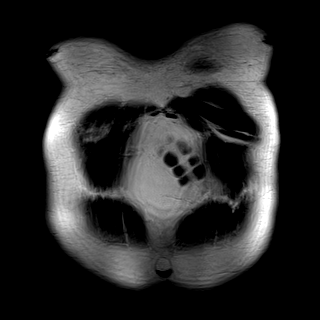

[Series 6: T2 fat-sat · axial · 6.0mm · 1.19mm/px · z∈[-91,+132]mm · 2 of 32 slices shown]
[im 1/32]
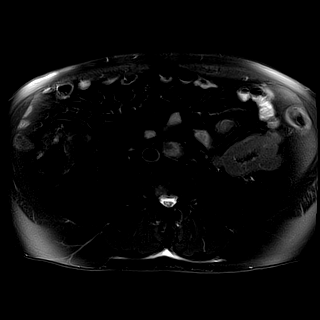
[im 32/32]
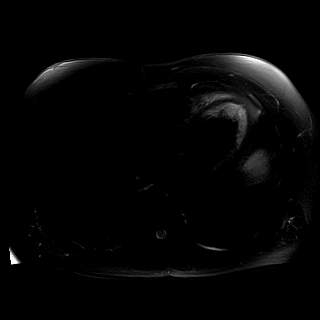

[Series 9: DWI · axial · 6.0mm · 1.42mm/px · z∈[-91,+132]mm · 5 of 96 slices shown (1 of 2)]
[im 1/96]
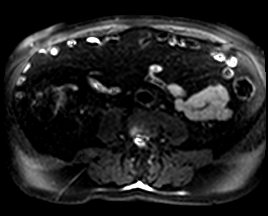
[im 24/96]
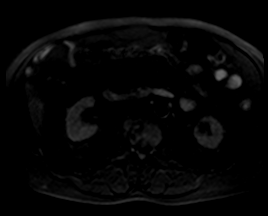
[im 48/96]
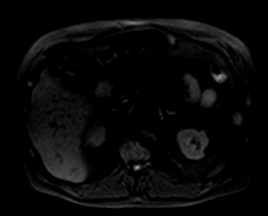
[im 72/96]
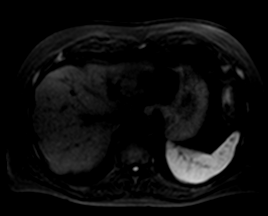
[im 96/96]
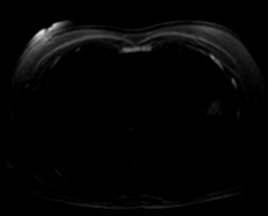

[Series 10: DWI · axial · 6.0mm · 1.42mm/px · 1 of 32 slices shown (2 of 2)]
[im 1/32]
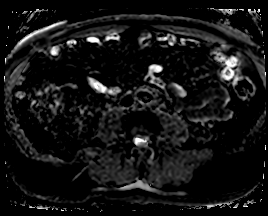

[Series 11: bSSFP · axial · 6.0mm · 0.74mm/px · 1 of 34 slices shown]
[im 1/34]
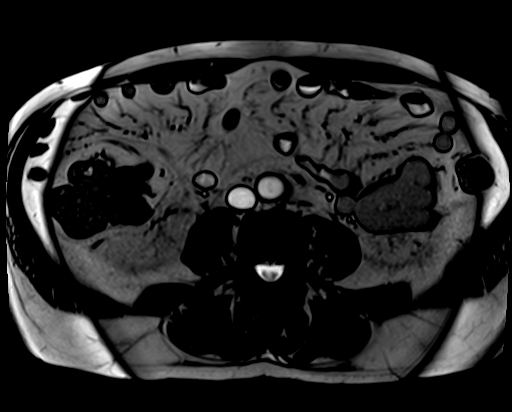

[Series 12: in & out · axial · 3.0mm · 1.19mm/px · z∈[-105,+132]mm · 3 of 80 slices shown (1 of 2)]
[im 1/80]
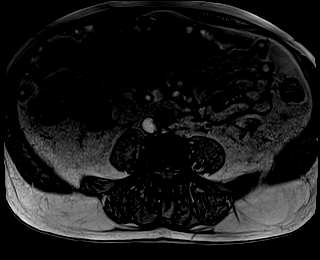
[im 40/80]
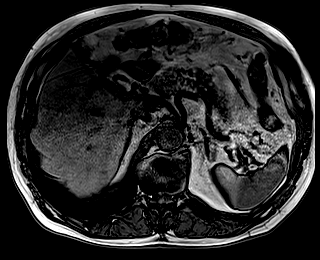
[im 80/80]
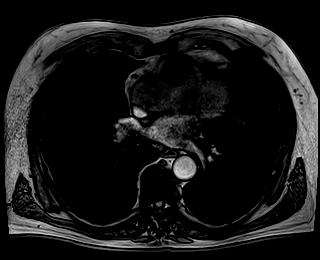

[Series 13: in & out · axial · 3.0mm · 1.19mm/px · z∈[-105,+132]mm · 3 of 80 slices shown (2 of 2)]
[im 1/80]
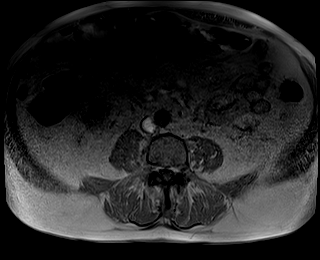
[im 40/80]
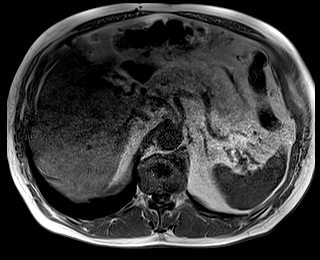
[im 80/80]
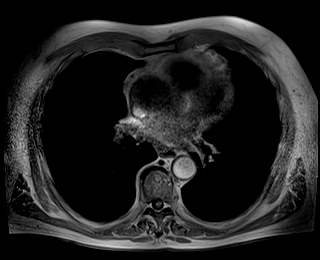

[Series 14: T2 · axial · 6.0mm · 1.19mm/px · 1 of 32 slices shown]
[im 1/32]
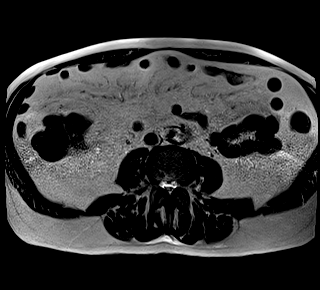

[Series 15: T1 dynamic · axial · non-contrast · 3.0mm · 1.19mm/px · z∈[-95,+142]mm · 3 of 80 slices shown (1 of 8)]
[im 1/80]
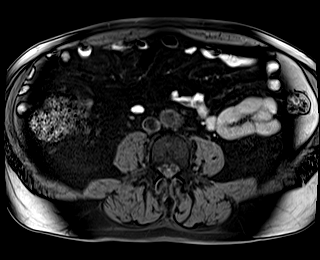
[im 40/80]
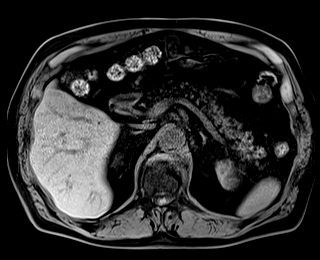
[im 80/80]
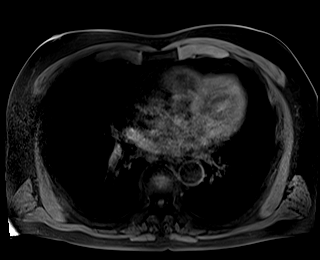

[Series 16: T1 dynamic · axial · 3.0mm · 1.19mm/px · z∈[-95,+142]mm · 3 of 80 slices shown (2 of 8)]
[im 1/80]
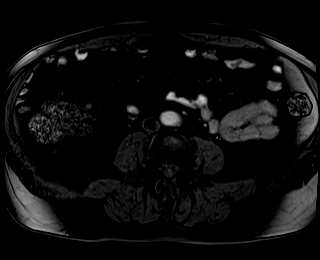
[im 40/80]
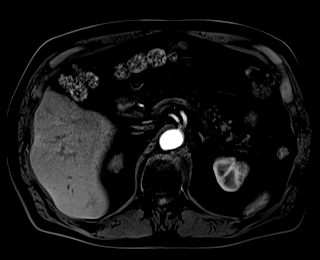
[im 80/80]
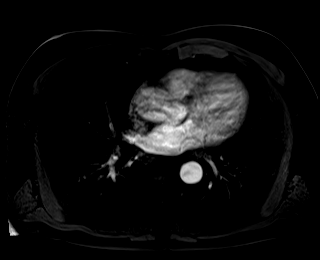

[Series 17: T1 dynamic · axial · 3.0mm · 1.19mm/px · z∈[-95,+142]mm · 3 of 80 slices shown (3 of 8)]
[im 1/80]
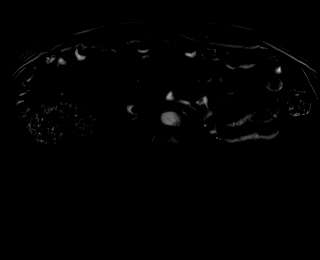
[im 40/80]
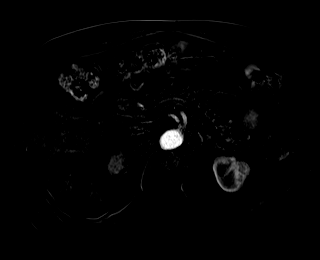
[im 80/80]
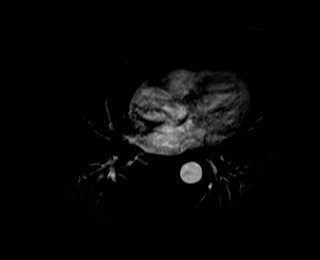

[Series 18: T1 dynamic · axial · 3.0mm · 1.19mm/px · z∈[-95,+142]mm · 3 of 80 slices shown (4 of 8)]
[im 1/80]
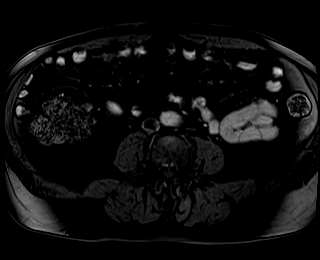
[im 40/80]
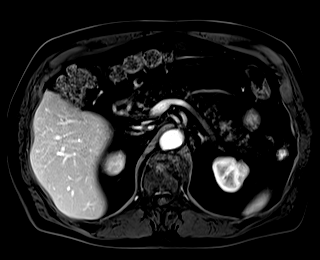
[im 80/80]
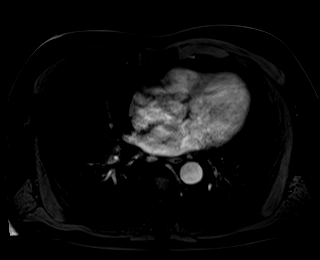

[Series 19: T1 dynamic · axial · 3.0mm · 1.19mm/px · z∈[-95,+142]mm · 3 of 80 slices shown (5 of 8)]
[im 1/80]
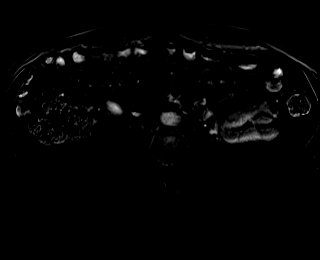
[im 40/80]
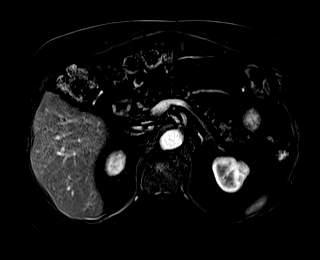
[im 80/80]
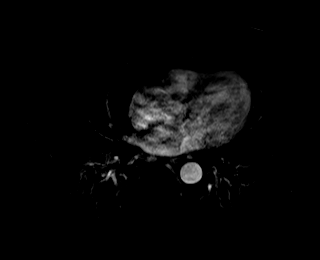

[Series 20: T1 dynamic · axial · 3.0mm · 1.19mm/px · z∈[-95,+142]mm · 3 of 80 slices shown (6 of 8)]
[im 1/80]
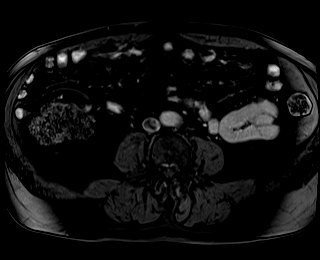
[im 40/80]
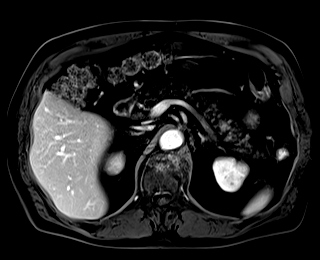
[im 80/80]
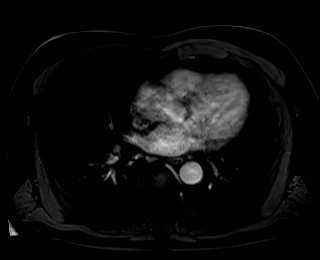

[Series 21: T1 dynamic · axial · 3.0mm · 1.19mm/px · z∈[-95,+142]mm · 3 of 80 slices shown (7 of 8)]
[im 1/80]
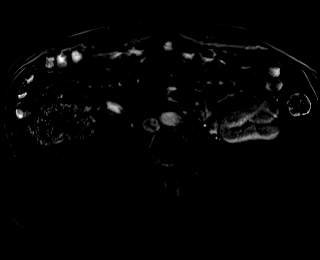
[im 40/80]
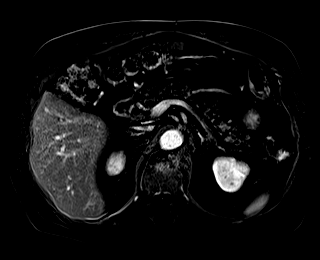
[im 80/80]
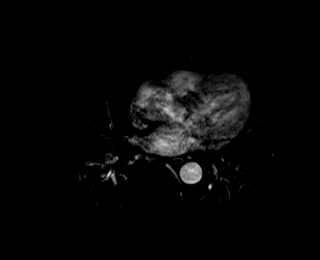

[Series 22: T1 dynamic post-contrast · coronal · 3.0mm · 1.31mm/px · 3 of 80 slices shown]
[im 1/80]
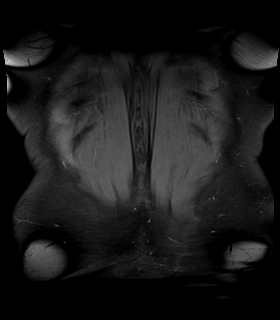
[im 40/80]
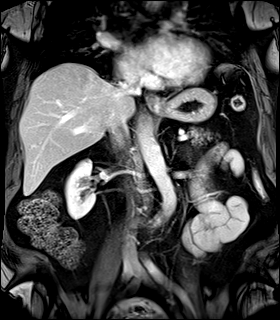
[im 80/80]
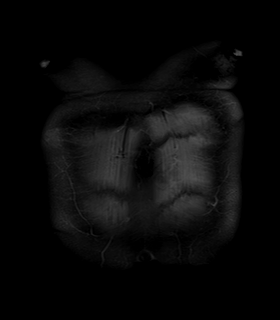

[Series 23: T1 dynamic · axial · 3.0mm · 1.19mm/px · 1 of 80 slices shown (8 of 8)]
[im 1/80]
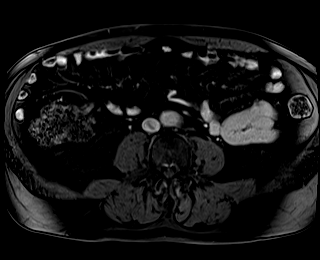

[43 of 48 positions shown; findings below may reference images not displayed]

FINDINGS: Lower chest: No acute findings.

Hepatobiliary: No mass or other parenchymal abnormality identified.
Status post cholecystectomy. No biliary ductal dilatation.

Pancreas: Fatty atrophy of the pancreatic parenchyma. Within the
pancreatic head and uncinate, there are multiple small, thinly
septated fluid signal lesions, largest in the uncinate measuring
cm (series 3, image 19), additional subcentimeter lesions in the
pancreatic head (series 3, image 20). No solid component or
suspicious contrast enhancement. No inflammatory findings. No
pancreatic ductal dilatation.

Spleen:  Within normal limits in size and appearance.

Adrenals/Urinary Tract: No masses identified. Partial superior pole
left nephrectomy or cortical scarring (series 3, image 10). Contrast
enhancing, partially exophytic lesion of the posterior midportion of
the left kidney adjacent to presumed nephrectomy margin measuring
1.2 cm (series 24, image 45). Contrast enhancing, partially
exophytic lesion of the posterior inferior pole of the left kidney
measuring 1.0 cm (series 21, image 68). No evidence of
hydronephrosis.

Stomach/Bowel: Visualized portions within the abdomen are
unremarkable.

Vascular/Lymphatic: No pathologically enlarged lymph nodes
identified. No abdominal aortic aneurysm demonstrated.

Other:  None.

Musculoskeletal: No suspicious bone lesions identified.
IMPRESSION: 1. Multiple small, thinly septated fluid signal lesions in the
pancreatic head and uncinate measuring up to 1.6 cm. These findings
are most consistent with small side branch intraductal papillary
mucinous neoplasms or pseudocysts. As there is no observed increased
risk of malignancy for such lesions smaller than 2 cm, no further
follow-up or characterization is specific required for these
lesions.
2. Partial superior pole left nephrectomy or cortical scarring.
3. Contrast enhancing, partially exophytic lesion of the posterior
midportion of the left kidney adjacent to presumed nephrectomy
margin measuring 1.2 cm and suspicious for a small renal cell
carcinoma.
4. Contrast enhancing, partially exophytic lesion of the posterior
inferior pole of the left kidney measuring 1.0 cm, as above
suspicious for a small renal cell carcinoma.

These results will be called to the ordering clinician or
representative by the Radiologist Assistant, and communication
documented in the PACS or [REDACTED].

ADDENDUM:
Addendum is made for comparison to a prior outside CT dated
[DATE], which is now available for review and comparison but was
not available at the time of initial interpretation.

1. In addition to the two contrast enhancing left renal masses
described in the original report, there is an additional smaller
subcentimeter mass of the medial inferior pole of the right kidney
measuring 0.8 cm (series 18, image 70). This is highly suspicious
for an additional small renal cell carcinoma.

2. By report, the patient has had no prior resection of the left
kidney, and therefore differential considerations to explain the
sizable cortical defect remain as originally reported, cortical
scarring related to prior ischemic, infectious, or obstructive
insult.

*** End of Addendum ***
FINDINGS: Lower chest: No acute findings.

Hepatobiliary: No mass or other parenchymal abnormality identified.
Status post cholecystectomy. No biliary ductal dilatation.

Pancreas: Fatty atrophy of the pancreatic parenchyma. Within the
pancreatic head and uncinate, there are multiple small, thinly
septated fluid signal lesions, largest in the uncinate measuring
cm (series 3, image 19), additional subcentimeter lesions in the
pancreatic head (series 3, image 20). No solid component or
suspicious contrast enhancement. No inflammatory findings. No
pancreatic ductal dilatation.

Spleen:  Within normal limits in size and appearance.

Adrenals/Urinary Tract: No masses identified. Partial superior pole
left nephrectomy or cortical scarring (series 3, image 10). Contrast
enhancing, partially exophytic lesion of the posterior midportion of
the left kidney adjacent to presumed nephrectomy margin measuring
1.2 cm (series 24, image 45). Contrast enhancing, partially
exophytic lesion of the posterior inferior pole of the left kidney
measuring 1.0 cm (series 21, image 68). No evidence of
hydronephrosis.

Stomach/Bowel: Visualized portions within the abdomen are
unremarkable.

Vascular/Lymphatic: No pathologically enlarged lymph nodes
identified. No abdominal aortic aneurysm demonstrated.

Other:  None.

Musculoskeletal: No suspicious bone lesions identified.
IMPRESSION: 1. Multiple small, thinly septated fluid signal lesions in the
pancreatic head and uncinate measuring up to 1.6 cm. These findings
are most consistent with small side branch intraductal papillary
mucinous neoplasms or pseudocysts. As there is no observed increased
risk of malignancy for such lesions smaller than 2 cm, no further
follow-up or characterization is specific required for these
lesions.
2. Partial superior pole left nephrectomy or cortical scarring.
3. Contrast enhancing, partially exophytic lesion of the posterior
midportion of the left kidney adjacent to presumed nephrectomy
margin measuring 1.2 cm and suspicious for a small renal cell
carcinoma.
4. Contrast enhancing, partially exophytic lesion of the posterior
inferior pole of the left kidney measuring 1.0 cm, as above
suspicious for a small renal cell carcinoma.

These results will be called to the ordering clinician or
representative by the Radiologist Assistant, and communication
documented in the PACS or [REDACTED].

## 2021-02-04 MED ORDER — GADOBUTROL 1 MMOL/ML IV SOLN
9.0000 mL | Freq: Once | INTRAVENOUS | Status: AC | PRN
  Administered 2021-02-04: 10:00:00 9 mL via INTRAVENOUS

## 2021-05-10 ENCOUNTER — Other Ambulatory Visit: Payer: Self-pay | Admitting: Urology

## 2021-05-10 ENCOUNTER — Other Ambulatory Visit: Payer: Self-pay | Admitting: Orthopedic Surgery

## 2021-05-10 DIAGNOSIS — N281 Cyst of kidney, acquired: Secondary | ICD-10-CM

## 2021-05-31 ENCOUNTER — Ambulatory Visit
Admission: RE | Admit: 2021-05-31 | Discharge: 2021-05-31 | Disposition: A | Discharge status: Home / Self Care | Payer: Medicare HMO | Source: Ambulatory Visit | Attending: Urology | Admitting: Urology

## 2021-05-31 DIAGNOSIS — N281 Cyst of kidney, acquired: Secondary | ICD-10-CM | POA: Diagnosis not present

## 2021-05-31 IMAGING — MR MR ABDOMEN WO/W CM
18 series · 48 of 48 positions shown · IV contrast (gadavist)
Comparison: MRI abdomen dated [DATE]

CLINICAL DATA: Follow-up renal lesions.

EXAM:
MRI ABDOMEN WITHOUT AND WITH CONTRAST
TECHNIQUE: Multiplanar multisequence MR imaging of the abdomen was performed
both before and after the administration of intravenous contrast.
CONTRAST:  9mL GADAVIST GADOBUTROL 1 MMOL/ML IV SOLN

[Series 3: cor haste · coronal · 6.0mm · 1.19mm/px · 2 of 32 slices shown]
[im 1/32]
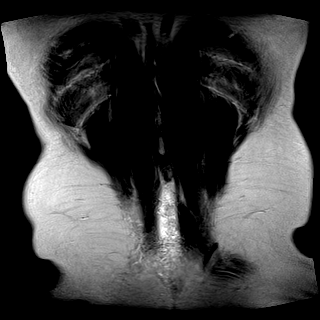
[im 32/32]
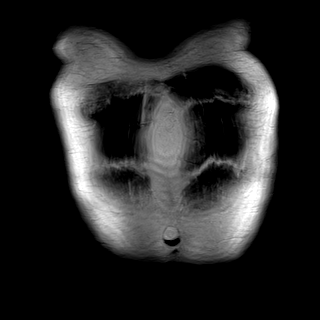

[Series 6: T2 fat-sat · axial · 6.0mm · 1.19mm/px · z∈[-79,+144]mm · 2 of 29 slices shown]
[im 1/29]
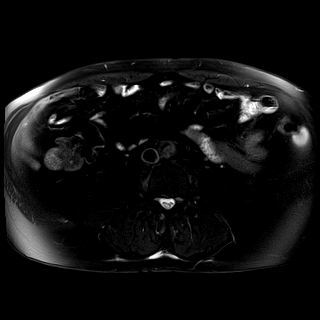
[im 29/29]
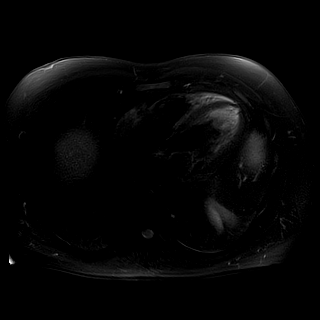

[Series 7: DWI · axial · 6.0mm · 1.42mm/px · z∈[-79,+144]mm · 5 of 96 slices shown (1 of 2)]
[im 1/96]
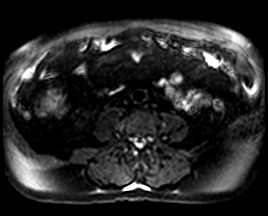
[im 24/96]
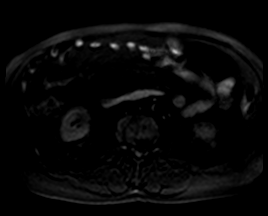
[im 48/96]
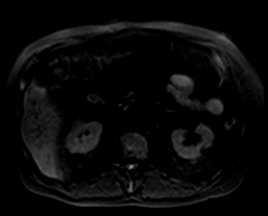
[im 72/96]
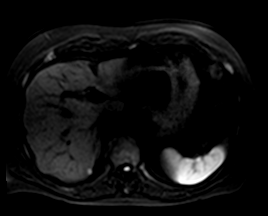
[im 96/96]
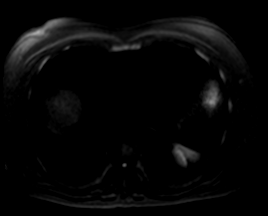

[Series 8: DWI · axial · 6.0mm · 1.42mm/px · 1 of 32 slices shown (2 of 2)]
[im 1/32]
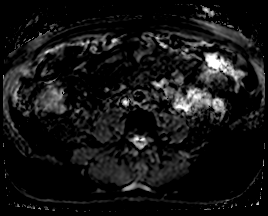

[Series 9: in & out · axial · 3.0mm · 1.19mm/px · z∈[-92,+121]mm · 3 of 72 slices shown (1 of 2)]
[im 1/72]
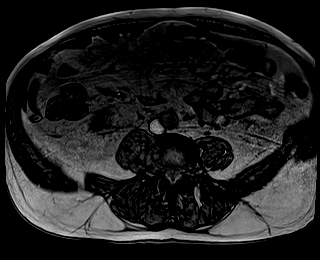
[im 36/72]
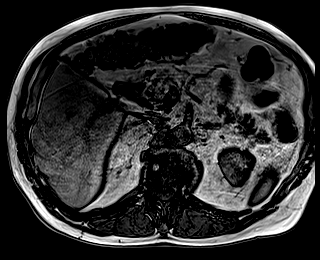
[im 72/72]
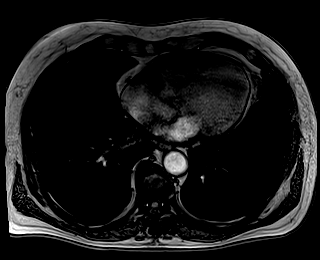

[Series 10: in & out · axial · 3.0mm · 1.19mm/px · z∈[-92,+121]mm · 3 of 72 slices shown (2 of 2)]
[im 1/72]
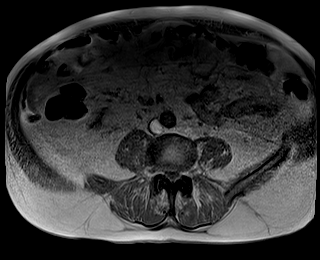
[im 36/72]
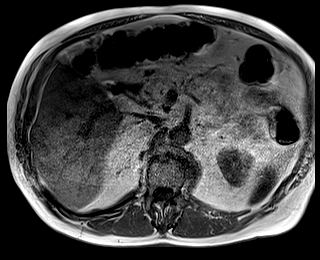
[im 72/72]
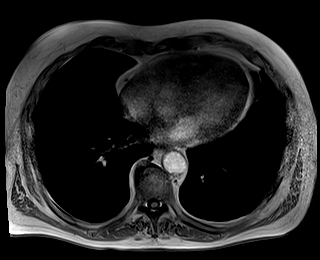

[Series 11: bSSFP · axial · 6.0mm · 0.74mm/px · 1 of 32 slices shown]
[im 1/32]
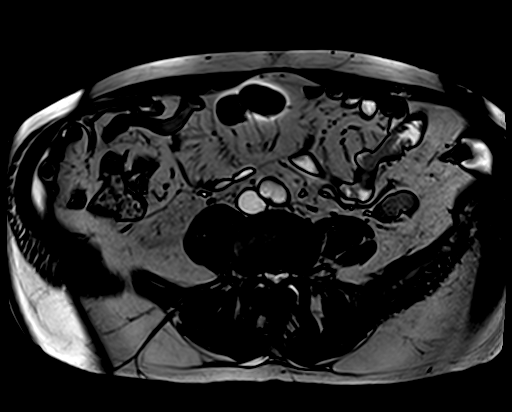

[Series 12: T1 dynamic · axial · non-contrast · 3.0mm · 1.19mm/px · z∈[-92,+121]mm · 3 of 72 slices shown (1 of 9)]
[im 1/72]
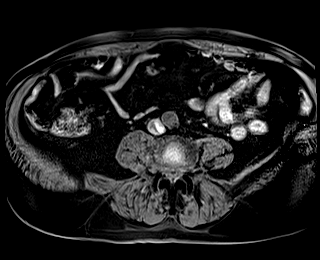
[im 36/72]
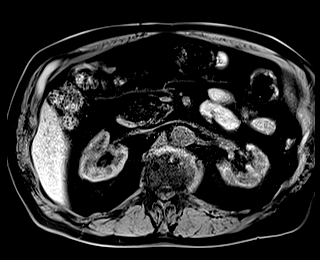
[im 72/72]
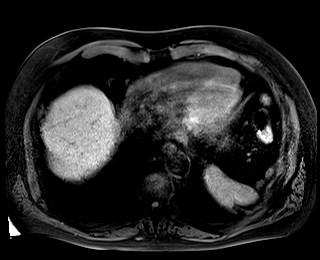

[Series 13: T1 dynamic · axial · 3.0mm · 1.19mm/px · z∈[-92,+121]mm · 3 of 72 slices shown (2 of 9)]
[im 1/72]
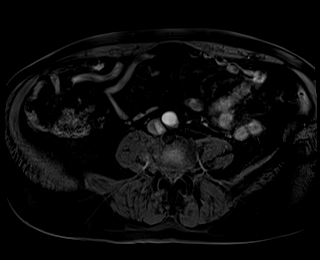
[im 36/72]
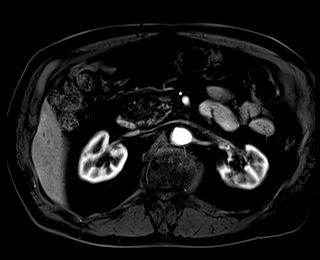
[im 72/72]
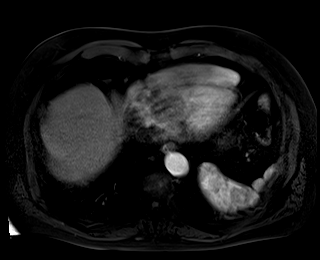

[Series 14: T1 dynamic · axial · 3.0mm · 1.19mm/px · z∈[-92,+121]mm · 3 of 72 slices shown (3 of 9)]
[im 1/72]
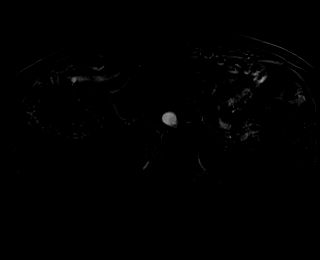
[im 36/72]
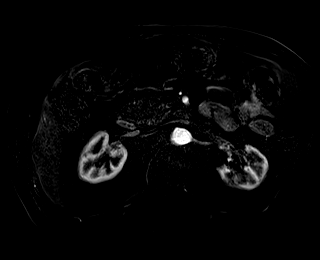
[im 72/72]
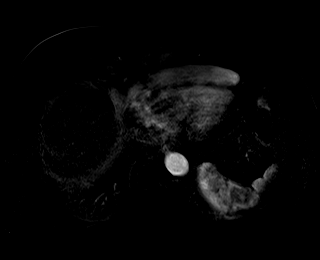

[Series 15: T1 dynamic · axial · 3.0mm · 1.19mm/px · z∈[-92,+121]mm · 3 of 72 slices shown (4 of 9)]
[im 1/72]
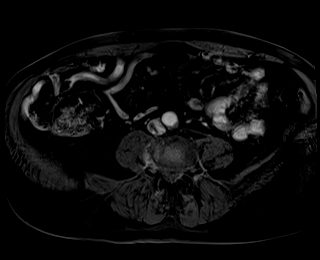
[im 36/72]
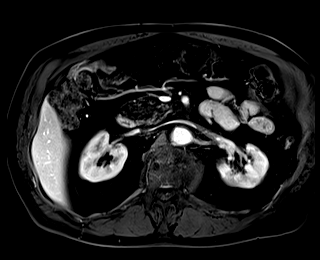
[im 72/72]
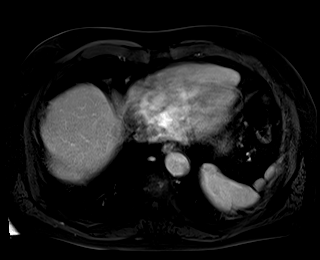

[Series 16: T1 dynamic · axial · 3.0mm · 1.19mm/px · z∈[-92,+121]mm · 3 of 72 slices shown (5 of 9)]
[im 1/72]
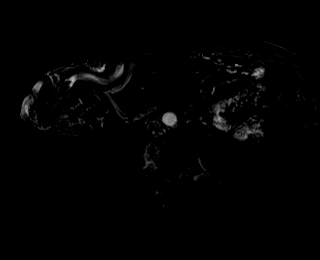
[im 36/72]
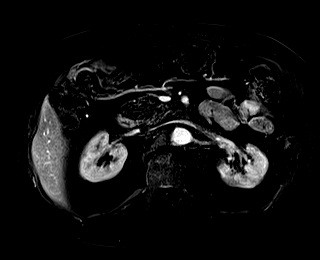
[im 72/72]
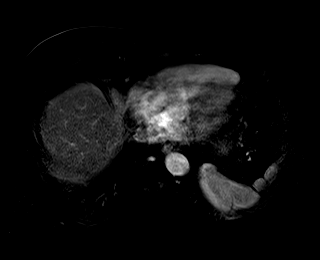

[Series 17: T1 dynamic · axial · 3.0mm · 1.19mm/px · z∈[-92,+121]mm · 3 of 71 slices shown (6 of 9)]
[im 1/71]
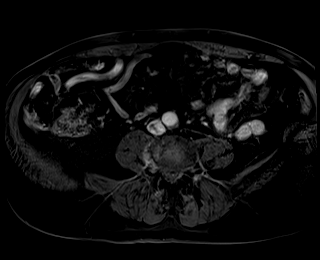
[im 36/71]
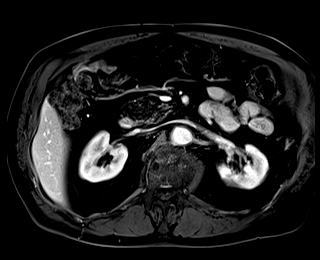
[im 71/71]
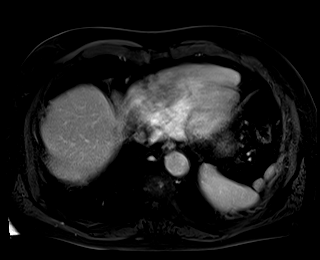

[Series 18: T1 dynamic · axial · 3.0mm · 1.19mm/px · z∈[-92,+121]mm · 3 of 72 slices shown (7 of 9)]
[im 1/72]
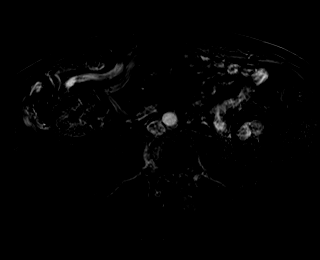
[im 36/72]
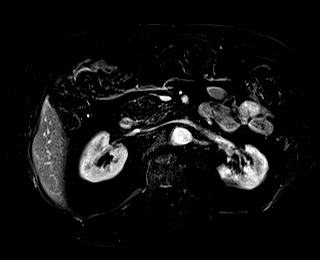
[im 72/72]
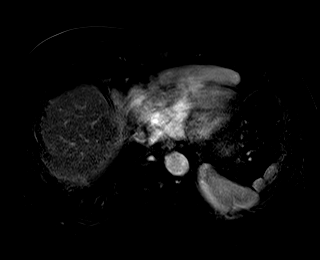

[Series 19: T1 dynamic post-contrast · coronal · 3.0mm · 1.31mm/px · 3 of 80 slices shown]
[im 1/80]
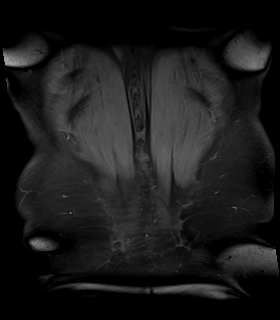
[im 40/80]
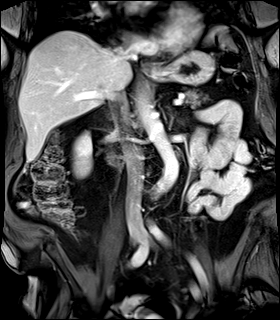
[im 80/80]
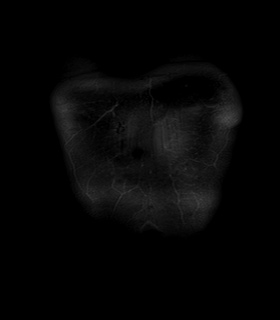

[Series 20: T2 · axial · 6.0mm · 1.19mm/px · 1 of 32 slices shown]
[im 1/32]
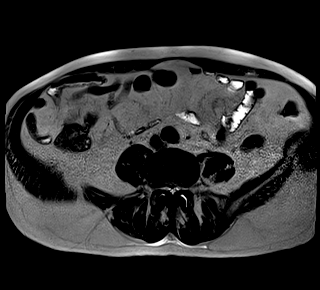

[Series 21: T1 dynamic · axial · 3.0mm · 1.19mm/px · z∈[-86,+121]mm · 3 of 70 slices shown (8 of 9)]
[im 1/70]
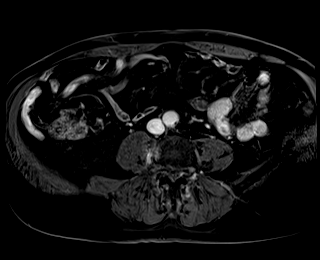
[im 35/70]
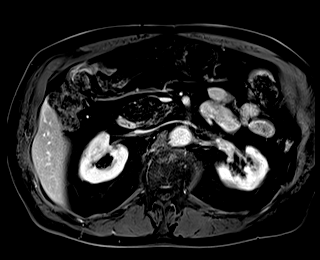
[im 70/70]
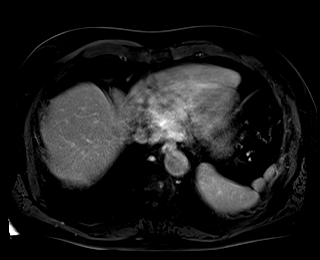

[Series 22: T1 dynamic · axial · 3.0mm · 1.19mm/px · z∈[-92,+121]mm · 3 of 72 slices shown (9 of 9)]
[im 1/72]
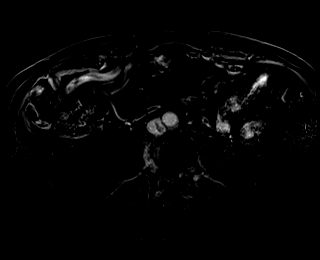
[im 36/72]
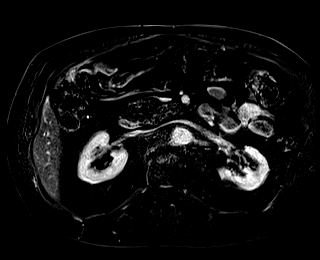
[im 72/72]
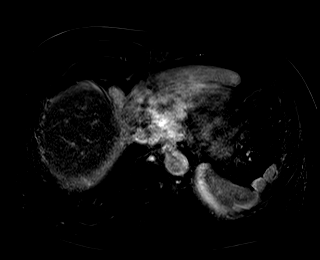

[48 of 48 positions shown; findings below may reference images not displayed]

FINDINGS: Lower chest: No acute findings.

Hepatobiliary: Small scattered T2 hyperintense lesions with no
evidence of enhancement, compatible with simple cysts. No suspicious
liver lesions. Gallbladder is surgically absent. No biliary ductal
dilation.

Pancreas: Fatty atrophy of the pancreas. Stable small septated
cystic lesions are seen within the pancreatic head and uncinate
process. Largest in the uncinate process measures 1.6 cm on series
20, image 20.

Spleen:  Within normal limits in size and appearance.

Adrenals/Urinary Tract: Bilateral adrenal glands are unremarkable.
No hydronephrosis. Partial superior pole left nephrectomy/cortical
scarring. Small enhancing solid lesion of the lower pole of the
right kidney measuring 8 mm on series 13, image 56, unchanged in
size when compared with prior exam. Small enhancing lesion of the
lower pole of the left kidney measuring 1.0 cm on series 13, image
50, unchanged in size when compared to prior exam. Enhancing lesion
of the midportion of the left kidney located adjacent to received
nephrectomy margin measuring 1.2 cm on series 13, image 30,
unchanged when compared with prior exam.

Stomach/Bowel: Visualized portions within the abdomen are
unremarkable.

Vascular/Lymphatic: No pathologically enlarged lymph nodes
identified. No abdominal aortic aneurysm demonstrated.

Other:  None.

Musculoskeletal: No suspicious bone lesions identified.
IMPRESSION: 1. Stable bilateral enhancing renal lesions, largest is located in
the midportion of the left kidney and measures up to 0.2 cm, likely
small renal cell carcinomas.
2. Stable small cystic lesions of the pancreatic head and uncinate
process, largest measures up to 1.6 cm, likely small intraductal
papillary mucinous neoplasms.

## 2021-05-31 MED ORDER — GADOBUTROL 1 MMOL/ML IV SOLN
9.0000 mL | Freq: Once | INTRAVENOUS | Status: AC | PRN
  Administered 2021-05-31: 9 mL via INTRAVENOUS

## 2021-07-13 ENCOUNTER — Ambulatory Visit: Payer: Medicare HMO | Admitting: Dermatology

## 2021-07-18 ENCOUNTER — Ambulatory Visit: Payer: Medicare HMO | Admitting: Dermatology

## 2021-07-18 DIAGNOSIS — B353 Tinea pedis: Secondary | ICD-10-CM

## 2021-07-18 DIAGNOSIS — L821 Other seborrheic keratosis: Secondary | ICD-10-CM

## 2021-07-18 DIAGNOSIS — L820 Inflamed seborrheic keratosis: Secondary | ICD-10-CM | POA: Diagnosis not present

## 2021-07-18 DIAGNOSIS — L578 Other skin changes due to chronic exposure to nonionizing radiation: Secondary | ICD-10-CM

## 2021-07-18 DIAGNOSIS — B351 Tinea unguium: Secondary | ICD-10-CM

## 2021-07-18 DIAGNOSIS — L570 Actinic keratosis: Secondary | ICD-10-CM

## 2021-07-18 MED ORDER — FLUOROURACIL 5 % EX CREA
TOPICAL_CREAM | Freq: Two times a day (BID) | CUTANEOUS | 1 refills | Status: DC
Start: 2021-07-18 — End: 2022-01-17

## 2021-07-18 NOTE — Patient Instructions (Signed)
In one month, start Fluorouracil 5%/Calcipotriene cream twice daily to forehead x 1 week   Cryotherapy Aftercare  Wash gently with soap and water everyday.   Apply Vaseline and Band-Aid daily until healed.     Due to recent changes in healthcare laws, you may see results of your pathology and/or laboratory studies on MyChart before the doctors have had a chance to review them. We understand that in some cases there may be results that are confusing or concerning to you. Please understand that not all results are received at the same time and often the doctors may need to interpret multiple results in order to provide you with the best plan of care or course of treatment. Therefore, we ask that you please give [place] 2 business days to thoroughly review all your results before contacting the office for clarification. Should we see a critical lab result, you will be contacted sooner.   If You Need Anything After Your Visit  If you have any questions or concerns for your doctor, please call our main line at [phone] and press option 4 to reach your doctor's medical assistant. If no one answers, please leave a voicemail as directed and we will return your call as soon as possible. Messages left after 4 pm will be answered the following business day.   You may also send [place] a message via [place]. We typically respond to MyChart messages within 1-2 business days.  For prescription refills, please ask your pharmacy to contact our office. Our fax number is [phone].  If you have an urgent issue when the clinic is closed that cannot wait until the next business day, you can page your doctor at the number below.    Please note that while we do our best to be available for urgent issues outside of office hours, we are not available 24/7.   If you have an urgent issue and are unable to reach [place], you may choose to seek medical care at your doctor's office, retail clinic, urgent care center, or  emergency room.  If you have a medical emergency, please immediately call 911 or go to the emergency department.  Pager Numbers  - Dr. Nehemiah Massed: [phone]  - Dr. Laurence Ferrari: [phone]  - Dr. Nicole Kindred: [phone]  In the event of inclement weather, please call our main line at [phone] for an update on the status of any delays or closures.  Dermatology Medication Tips: Please keep the boxes that topical medications come in in order to help keep track of the instructions about where and how to use these. Pharmacies typically print the medication instructions only on the boxes and not directly on the medication tubes.   If your medication is too expensive, please contact our office at [phone] option 4 or send [place] a message through [place].   We are unable to tell what your co-pay for medications will be in advance as this is different depending on your insurance coverage. However, we may be able to find a substitute medication at lower cost or fill out paperwork to get insurance to cover a needed medication.   If a prior authorization is required to get your medication covered by your insurance company, please allow [place] 1-2 business days to complete this process.  Drug prices often vary depending on where the prescription is filled and some pharmacies may offer cheaper prices.  The website www.goodrx.com contains coupons for medications through different pharmacies. The prices here do not account for what the cost may  be with help from insurance (it may be cheaper with your insurance), but the website can give you the price if you did not use any insurance.  - You can print the associated coupon and take it with your prescription to the pharmacy.  - You may also stop by our office during regular business hours and pick up a GoodRx coupon card.  - If you need your prescription sent electronically to a different pharmacy, notify our office through [facility] or by phone at  [phone] option 4.     Si Usted Necesita Algo Despus de Su Visita  Tambin puede enviarnos un mensaje a travs de Pharmacist, community. Por lo general respondemos a los mensajes de MyChart en el transcurso de 1 a 2 das hbiles.  Para renovar recetas, por favor pida a su farmacia que se ponga en contacto con nuestra oficina. Harland Dingwall de fax es [place] [phone].  Si tiene un asunto urgente cuando la clnica est cerrada y que no puede esperar hasta el siguiente da hbil, puede llamar/localizar a su doctor(a) al nmero que aparece a continuacin.   Por favor, tenga en cuenta que aunque hacemos todo lo posible para estar disponibles para asuntos urgentes fuera del horario de [place], no estamos disponibles las 24 horas del da, los 7 das de la [place].   Si tiene un problema urgente y no puede comunicarse con nosotros, puede optar por buscar atencin mdica  en el consultorio de su doctor(a), en una clnica privada, en un centro de atencin urgente o en una sala de emergencias.  Si tiene Engineering geologist, por favor llame inmediatamente al 911 o vaya a la sala de emergencias.  Nmeros de bper  - Dr. Nehemiah Massed: [phone]  - Dra. Moye: [phone]  - Dra. Nicole Kindred: [phone]  En caso de inclemencias del [place], por favor llame a Johnsie Kindred principal al [phone] para una actualizacin sobre el [place] de cualquier retraso o cierre.  Consejos para la medicacin en dermatologa: Por favor, guarde las cajas en las que vienen los medicamentos de uso tpico para ayudarle a seguir las instrucciones sobre dnde y cmo usarlos. Las farmacias generalmente imprimen las instrucciones del medicamento slo en las cajas y no directamente en los tubos del [place].   Si su medicamento es muy caro, por favor, pngase en contacto con Zigmund Daniel llamando al [phone] y presione la opcin 4 o envenos un mensaje a travs de Pharmacist, community.   No podemos decirle cul ser su copago por los  medicamentos por adelantado ya que esto es diferente dependiendo de la cobertura de su seguro. Sin embargo, es posible que podamos encontrar un medicamento sustituto a Electrical engineer un formulario para que el seguro cubra el medicamento que se considera necesario.   Si se requiere una autorizacin previa para que su compaa de seguros [place] su medicamento, por favor permtanos de 1 a 2 das hbiles para completar este proceso.  Los precios de los medicamentos varan con frecuencia dependiendo del Environmental consultant de dnde se surte la receta y alguna farmacias pueden ofrecer precios ms baratos.  El sitio web www.goodrx.com tiene cupones para medicamentos de Airline pilot. Los precios aqu no tienen en cuenta lo que podra costar con la ayuda del seguro (puede ser ms barato con su seguro), pero el sitio web puede darle el precio si no utiliz Research scientist (physical sciences).  - Puede imprimir el cupn correspondiente y llevarlo con su receta a la farmacia.  - Tambin puede pasar por Zigmund Daniel  durante el horario de atencin regular y Charity fundraiser una tarjeta de cupones de GoodRx.  - Si necesita que su receta se enve electrnicamente a una farmacia diferente, informe a nuestra oficina a travs de MyChart de [place] o por telfono llamando al [phone] y presione la opcin 4.

## 2021-07-18 NOTE — Progress Notes (Signed)
Follow-Up Visit   Subjective  Daniel Mason is a 76 y.o. male who presents for the following: Follow-up (Tinea follow up - Lamisil 250 mg 1 po x 4 months/ISK follow up left temple, chest treated with LN2) and Actinic Keratosis (6 month follow up - face, ears treated with LN2).  The following portions of the chart were reviewed this encounter and updated as appropriate:   Tobacco  Allergies  Meds  Problems  Med Hx  Surg Hx  Fam Hx     Review of Systems:  No other skin or systemic complaints except as noted in HPI or Assessment and Plan.  Objective  Well appearing patient in no apparent distress; mood and affect are within normal limits.  All skin waist up examined.and feet.  Bilateral feet Toenails clear today  Left Temple Erythematous stuck-on, waxy papule or plaque  Forehead x 3, scalp x 1 (4) Erythematous thin papules/macules with gritty scale.    Assessment & Plan   Seborrheic Keratoses - Stuck-on, waxy, tan-brown papules and/or plaques  - Benign-appearing - Discussed benign etiology and prognosis. - Observe - Call for any changes  Tinea unguium Bilateral feet Tinea pedis and unguium - resolved May continue Ciclopirox cream qhs 2-3 times per week. Discussed risk of recurrence  Related Medications terbinafine (LAMISIL) 250 MG tablet Take 1 tablet (250 mg total) by mouth daily.  Inflamed seborrheic keratosis Left Temple  Destruction of lesion - Left Temple Complexity: simple   Destruction method: cryotherapy   Informed consent: discussed and consent obtained   Timeout:  patient name, date of birth, surgical site, and procedure verified Lesion destroyed using liquid nitrogen: Yes   Region frozen until ice ball extended beyond lesion: Yes   Outcome: patient tolerated procedure well with no complications   Post-procedure details: wound care instructions given    AK (actinic keratosis) (4) Forehead x 3, scalp x 1  Actinic Damage - Severe, confluent  actinic changes with pre-cancerous actinic keratoses  - Severe, chronic, not at goal, secondary to cumulative UV radiation exposure over time - diffuse scaly erythematous macules and papules with underlying dyspigmentation - Discussed Prescription "Field Treatment" for Severe, Chronic Confluent Actinic Changes with Pre-Cancerous Actinic Keratoses Field treatment involves treatment of an entire area of skin that has confluent Actinic Changes (Sun/ Ultraviolet light damage) and PreCancerous Actinic Keratoses by method of PhotoDynamic Therapy (PDT) and/or prescription Topical Chemotherapy agents such as 5-fluorouracil, 5-fluorouracil/calcipotriene, and/or imiquimod.  The purpose is to decrease the number of clinically evident and subclinical PreCancerous lesions to prevent progression to development of skin cancer by chemically destroying early precancer changes that may or may not be visible.  It has been shown to reduce the risk of developing skin cancer in the treated area. As a result of treatment, redness, scaling, crusting, and open sores may occur during treatment course. One or more than one of these methods may be used and may have to be used several times to control, suppress and eliminate the PreCancerous changes. Discussed treatment course, expected reaction, and possible side effects. - Recommend daily broad spectrum sunscreen SPF 30+ to sun-exposed areas, reapply every 2 hours as needed.  - Staying in the shade or wearing long sleeves, sun glasses (UVA+UVB protection) and wide brim hats (4-inch brim around the entire circumference of the hat) are also recommended. - Call for new or changing lesions.  In one month, start Fluorouracil 5%/Calcipotriene cream twice daily to forehead x 1 week  Destruction of lesion - Forehead x  3, scalp x 1 Complexity: simple   Destruction method: cryotherapy   Informed consent: discussed and consent obtained   Timeout:  patient name, date of birth, surgical  site, and procedure verified Lesion destroyed using liquid nitrogen: Yes   Region frozen until ice ball extended beyond lesion: Yes   Outcome: patient tolerated procedure well with no complications   Post-procedure details: wound care instructions given    Return in about 6 months (around 01/17/2022) for AK follow up.  I, Ashok Cordia, CMA, am acting as scribe for Sarina Ser, MD . Documentation: I have reviewed the above documentation for accuracy and completeness, and I agree with the above.  Sarina Ser, MD

## 2021-07-19 ENCOUNTER — Encounter: Payer: Self-pay | Admitting: Dermatology

## 2021-12-23 ENCOUNTER — Other Ambulatory Visit: Payer: Self-pay | Admitting: Urology

## 2021-12-23 DIAGNOSIS — R972 Elevated prostate specific antigen [PSA]: Secondary | ICD-10-CM

## 2021-12-23 DIAGNOSIS — C649 Malignant neoplasm of unspecified kidney, except renal pelvis: Secondary | ICD-10-CM

## 2022-01-03 ENCOUNTER — Ambulatory Visit
Admission: RE | Admit: 2022-01-03 | Discharge: 2022-01-03 | Disposition: A | Discharge status: Home / Self Care | Payer: Medicare HMO | Source: Ambulatory Visit | Attending: Urology | Admitting: Urology

## 2022-01-03 ENCOUNTER — Ambulatory Visit: Payer: Medicare HMO

## 2022-01-03 DIAGNOSIS — R972 Elevated prostate specific antigen [PSA]: Secondary | ICD-10-CM | POA: Insufficient documentation

## 2022-01-03 DIAGNOSIS — C649 Malignant neoplasm of unspecified kidney, except renal pelvis: Secondary | ICD-10-CM

## 2022-01-03 MED ORDER — GADOBUTROL 1 MMOL/ML IV SOLN
9.0000 mL | Freq: Once | INTRAVENOUS | Status: AC | PRN
  Administered 2022-01-03: 9 mL via INTRAVENOUS

## 2022-01-07 ENCOUNTER — Ambulatory Visit
Admission: RE | Admit: 2022-01-07 | Discharge: 2022-01-07 | Disposition: A | Discharge status: Home / Self Care | Payer: Medicare HMO | Source: Ambulatory Visit | Attending: Urology | Admitting: Urology

## 2022-01-07 DIAGNOSIS — R972 Elevated prostate specific antigen [PSA]: Secondary | ICD-10-CM | POA: Diagnosis not present

## 2022-01-07 MED ORDER — GADOBUTROL 1 MMOL/ML IV SOLN
9.0000 mL | Freq: Once | INTRAVENOUS | Status: AC | PRN
  Administered 2022-01-07: 10 mL via INTRAVENOUS

## 2022-01-17 ENCOUNTER — Encounter: Payer: Self-pay | Admitting: Dermatology

## 2022-01-17 ENCOUNTER — Ambulatory Visit: Payer: Medicare HMO | Admitting: Dermatology

## 2022-01-17 DIAGNOSIS — L578 Other skin changes due to chronic exposure to nonionizing radiation: Secondary | ICD-10-CM | POA: Diagnosis not present

## 2022-01-17 DIAGNOSIS — D1801 Hemangioma of skin and subcutaneous tissue: Secondary | ICD-10-CM

## 2022-01-17 DIAGNOSIS — L570 Actinic keratosis: Secondary | ICD-10-CM | POA: Diagnosis not present

## 2022-01-17 DIAGNOSIS — L821 Other seborrheic keratosis: Secondary | ICD-10-CM | POA: Diagnosis not present

## 2022-01-17 DIAGNOSIS — Z86018 Personal history of other benign neoplasm: Secondary | ICD-10-CM

## 2022-01-17 DIAGNOSIS — D692 Other nonthrombocytopenic purpura: Secondary | ICD-10-CM

## 2022-01-17 NOTE — Progress Notes (Signed)
   Follow-Up Visit   Subjective  Daniel Mason is a 76 y.o. male who presents for the following: Actinic Keratosis (Face, 44mf/u, pt used 5FU/Calcipotriene cr bid x 1 wk to forehead). The patient has spots, moles and lesions to be evaluated, some may be new or changing and the patient has concerns that these could be cancer.  The following portions of the chart were reviewed this encounter and updated as appropriate:   Tobacco  Allergies  Meds  Problems  Med Hx  Surg Hx  Fam Hx     Review of Systems:  No other skin or systemic complaints except as noted in HPI or Assessment and Plan.  Objective  Well appearing patient in no apparent distress; mood and affect are within normal limits.  All skin waist up examined.  face, scalp, ears x 16 (16) Pink scaly macules   Assessment & Plan   Seborrheic Keratoses - Stuck-on, waxy, tan-brown papules and/or plaques  - Benign-appearing - Discussed benign etiology and prognosis. - Observe - Call for any changes  Hemangiomas - Red papules - Discussed benign nature - Observe - Call for any changes   Actinic Damage - chronic, secondary to cumulative UV radiation exposure/sun exposure over time - diffuse scaly erythematous macules with underlying dyspigmentation - Recommend daily broad spectrum sunscreen SPF 30+ to sun-exposed areas, reapply every 2 hours as needed.  - Recommend staying in the shade or wearing long sleeves, sun glasses (UVA+UVB protection) and wide brim hats (4-inch brim around the entire circumference of the hat). - Call for new or changing lesions.   Purpura - Chronic; persistent and recurrent.  Treatable, but not curable. 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  - Call if any new or changing lesions are noted between office visits  - L lat abdomen  AK (actinic keratosis) (16) face, scalp, ears x 16  Good results with 5FU/Calcipotriene cream, may consider using again in the future  Destruction of lesion - face, scalp, ears x 16 Complexity: simple   Destruction method: cryotherapy   Informed consent: discussed and consent obtained   Timeout:  patient name, date of birth, surgical site, and procedure verified Lesion destroyed using liquid nitrogen: Yes   Region frozen until ice ball extended beyond lesion: Yes   Outcome: patient tolerated procedure well with no complications   Post-procedure details: wound care instructions given     Return in about 6 months (around 07/19/2022) for AK f/u.  I, SOthelia Pulling RMA, am acting as scribe for DSarina Ser MD . Documentation: I have reviewed the above documentation for accuracy and completeness, and I agree with the above.  DSarina Ser MD

## 2022-01-17 NOTE — Patient Instructions (Addendum)
Cryotherapy Aftercare  Wash gently with soap and water everyday.   Apply Vaseline and Band-Aid daily until healed.     Due to recent changes in healthcare laws, you may see results of your pathology and/or laboratory studies on MyChart before the doctors have had a chance to review them. We understand that in some cases there may be results that are confusing or concerning to you. Please understand that not all results are received at the same time and often the doctors may need to interpret multiple results in order to provide you with the best plan of care or course of treatment. Therefore, we ask that you please give us 2 business days to thoroughly review all your results before contacting the office for clarification. Should we see a critical lab result, you will be contacted sooner.   If You Need Anything After Your Visit  If you have any questions or concerns for your doctor, please call our main line at [phone] and press option 4 to reach your doctor's medical assistant. If no one answers, please leave a voicemail as directed and we will return your call as soon as possible. Messages left after 4 pm will be answered the following business day.   You may also send us a message via MyChart. We typically respond to MyChart messages within 1-2 business days.  For prescription refills, please ask your pharmacy to contact our office. Our fax number is [phone].  If you have an urgent issue when the clinic is closed that cannot wait until the next business day, you can page your doctor at the number below.    Please note that while we do our best to be available for urgent issues outside of office hours, we are not available 24/7.   If you have an urgent issue and are unable to reach us, you may choose to seek medical care at your doctor's office, retail clinic, urgent care center, or emergency room.  If you have a medical emergency, please immediately call 911 or go to the  emergency department.  Pager Numbers  - Dr. Kowalski: [phone]  - Dr. Moye: [phone]  - Dr. Stewart: [phone]  In the event of inclement weather, please call our main line at [phone] for an update on the status of any delays or closures.  Dermatology Medication Tips: Please keep the boxes that topical medications come in in order to help keep track of the instructions about where and how to use these. Pharmacies typically print the medication instructions only on the boxes and not directly on the medication tubes.   If your medication is too expensive, please contact our office at [phone] option 4 or send us a message through MyChart.   We are unable to tell what your co-pay for medications will be in advance as this is different depending on your insurance coverage. However, we may be able to find a substitute medication at lower cost or fill out paperwork to get insurance to cover a needed medication.   If a prior authorization is required to get your medication covered by your insurance company, please allow us 1-2 business days to complete this process.  Drug prices often vary depending on where the prescription is filled and some pharmacies may offer cheaper prices.  The website www.goodrx.com contains coupons for medications through different pharmacies. The prices here do not account for what the cost may be with help from insurance (it may be cheaper with your insurance), but the website can   give you the price if you did not use any insurance.  - You can print the associated coupon and take it with your prescription to the pharmacy.  - You may also stop by our office during regular business hours and pick up a GoodRx coupon card.  - If you need your prescription sent electronically to a different pharmacy, notify our office through [facility] MyChart or by phone at [phone] option 4.     Si Usted Necesita Algo Despus de Su Visita  Tambin puede  enviarnos un mensaje a travs de MyChart. Por lo general respondemos a los mensajes de MyChart en el transcurso de 1 a 2 das hbiles.  Para renovar recetas, por favor pida a su farmacia que se ponga en contacto con nuestra oficina. Nuestro nmero de fax es el [phone].  Si tiene un asunto urgente cuando la clnica est cerrada y que no puede esperar hasta el siguiente da hbil, puede llamar/localizar a su doctor(a) al nmero que aparece a continuacin.   Por favor, tenga en cuenta que aunque hacemos todo lo posible para estar disponibles para asuntos urgentes fuera del horario de oficina, no estamos disponibles las 24 horas del da, los 7 das de la semana.   Si tiene un problema urgente y no puede comunicarse con nosotros, puede optar por buscar atencin mdica  en el consultorio de su doctor(a), en una clnica privada, en un centro de atencin urgente o en una sala de emergencias.  Si tiene una emergencia mdica, por favor llame inmediatamente al 911 o vaya a la sala de emergencias.  Nmeros de bper  - Dr. Kowalski: [phone]  - Dra. Moye: [phone]  - Dra. Stewart: [phone]  En caso de inclemencias del tiempo, por favor llame a nuestra lnea principal al [phone] para una actualizacin sobre el estado de cualquier retraso o cierre.  Consejos para la medicacin en dermatologa: Por favor, guarde las cajas en las que vienen los medicamentos de uso tpico para ayudarle a seguir las instrucciones sobre dnde y cmo usarlos. Las farmacias generalmente imprimen las instrucciones del medicamento slo en las cajas y no directamente en los tubos del medicamento.   Si su medicamento es muy caro, por favor, pngase en contacto con nuestra oficina llamando al [phone] y presione la opcin 4 o envenos un mensaje a travs de MyChart.   No podemos decirle cul ser su copago por los medicamentos por adelantado ya que esto es diferente dependiendo de la cobertura de su seguro.  Sin embargo, es posible que podamos encontrar un medicamento sustituto a menor costo o llenar un formulario para que el seguro cubra el medicamento que se considera necesario.   Si se requiere una autorizacin previa para que su compaa de seguros cubra su medicamento, por favor permtanos de 1 a 2 das hbiles para completar este proceso.  Los precios de los medicamentos varan con frecuencia dependiendo del lugar de dnde se surte la receta y alguna farmacias pueden ofrecer precios ms baratos.  El sitio web www.goodrx.com tiene cupones para medicamentos de diferentes farmacias. Los precios aqu no tienen en cuenta lo que podra costar con la ayuda del seguro (puede ser ms barato con su seguro), pero el sitio web puede darle el precio si no utiliz ningn seguro.  - Puede imprimir el cupn correspondiente y llevarlo con su receta a la farmacia.  - Tambin puede pasar por nuestra oficina durante el horario de atencin regular y recoger una tarjeta de cupones de GoodRx.  -   Si necesita que su receta se enve electrnicamente a una farmacia diferente, informe a nuestra oficina a travs de MyChart de [place] o por telfono llamando al [phone] y presione la opcin 4.  

## 2022-01-27 ENCOUNTER — Encounter: Payer: Self-pay | Admitting: Dermatology

## 2022-07-26 ENCOUNTER — Ambulatory Visit: Payer: Medicare HMO | Admitting: Urology

## 2022-07-27 ENCOUNTER — Ambulatory Visit: Payer: Medicare HMO | Admitting: Dermatology

## 2022-08-02 ENCOUNTER — Ambulatory Visit: Payer: Medicare HMO | Admitting: Urology

## 2022-08-02 ENCOUNTER — Other Ambulatory Visit: Payer: Self-pay | Admitting: Urology

## 2022-08-02 ENCOUNTER — Encounter: Payer: Self-pay | Admitting: Urology

## 2022-08-02 VITALS — BP 126/77 | HR 80 | Ht 70.0 in | Wt 202.0 lb

## 2022-08-02 DIAGNOSIS — R972 Elevated prostate specific antigen [PSA]: Secondary | ICD-10-CM

## 2022-08-02 DIAGNOSIS — N401 Enlarged prostate with lower urinary tract symptoms: Secondary | ICD-10-CM | POA: Diagnosis not present

## 2022-08-02 DIAGNOSIS — N2889 Other specified disorders of kidney and ureter: Secondary | ICD-10-CM

## 2022-08-02 LAB — URINALYSIS, COMPLETE
Bilirubin, UA: NEGATIVE
Glucose, UA: NEGATIVE
Ketones, UA: NEGATIVE
Nitrite, UA: NEGATIVE
Specific Gravity, UA: >1.03 — ABNORMAL HIGH (ref 1.005–1.030)
Urobilinogen, Ur: 0.2 mg/dL (ref 0.2–1.0)
pH, UA: 5.5 (ref 5.0–7.5)

## 2022-08-02 LAB — MICROSCOPIC EXAMINATION: RBC, Urine: >30 /hpf — AB (ref 0–2)

## 2022-08-02 MED ORDER — CEFUROXIME AXETIL 500 MG PO TABS: 500.0000 mg | ORAL_TABLET | Freq: Two times a day (BID) | ORAL | 0 refills | Status: AC

## 2022-08-02 NOTE — Progress Notes (Signed)
I, [name], acting as a scribe for Daniel Altes, MD., have documented all relevant documentation on the behalf of Daniel Altes, MD, as directed by  Daniel Altes, MD while in the presence of Daniel Altes, MD.   08/02/2022 3:58 PM   Daniel Mason Jun 05, 1945 [id number]  Referring provider: Dorothey Baseman, MD [phone] [address] [place],  [place] [address]  Chief Complaint  Patient presents with   New Patient (Initial Visit)    HPI: Daniel Mason is a 77 y.o. male presenting for transfer of urologic care.  Previously followed by Dr. Evelene Croon who recently retired. Seen May 2023 for a PSA of 4.14 October 2020. Prostate MRI showed 2 PI-RADS 3 lesions and he elected observation. History of BPH status post GreenLight PVP and cystolitholapaxy of a bladder stone 11/2018. History of enhancing left renal lesions on active surveillance. On PDE-5 inhibitor for erectile dysfunction. Last visit with Dr. Evelene Croon was November 2023. PSA at that visit had increased to 6.8 and prostate MRI was repeated, which showed a volume of 84 cc and no evidence of high grade lesions. Last abd MRI November 2023 showed a stable 14 mm left renal lesion, which was unchanged and a 9 mm left lower pole enhancing renal lesion, which was unchanged. PSA drawn by PCP 07/10/22 was 5.51. He was started on Tamsulosin at his last visit with Dr. Evelene Croon for nocturia, which helped these symptoms, although over the last 2 weeks he has had worsening nocturia and urinary hesitancy. Prostate volume with his last MRI was 84 g.  ED-the PDE-5 inhibitor which works best is vardenafil He has tried intracavernosal injections in the past, but with frequency of use the medicine was typically beyond its shelf life.    PMH: Past Medical History:  Diagnosis Date   Allergy    Arthritis    Asthma    d/t a cough that he had for 30 years   Chronic bilateral low back pain with bilateral sciatica    DVT (deep venous thrombosis) (HCC)  04/2017   was on blood thinners until hematuria.  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   L lat abdomen, excised in [place] 04/30/19   Gallbladder disease    GERD (gastroesophageal reflux disease)    History of BPH    History of cataract    History of kidney stones    bladder stones and 2 small kidney stones   History of pulmonary embolus (PE)    Hypertension    Tremor     Surgical History: Past Surgical History:  Procedure Laterality Date   CHOLECYSTECTOMY  2010   COLONOSCOPY WITH PROPOFOL N/A 12/02/2020   Procedure: COLONOSCOPY WITH PROPOFOL;  Surgeon: Jaynie Collins, DO;  Location: [facility] ENDOSCOPY;  Service: Gastroenterology;  Laterality: N/A;   CYSTOSCOPY WITH LITHOLAPAXY N/A 11/21/2018   Procedure: CYSTOSCOPY WITH LITHOLAPAXY;  Surgeon: Orson Ape, MD;  Location: [facility] ORS;  Service: Urology;  Laterality: N/A;   ESOPHAGOGASTRODUODENOSCOPY (EGD) WITH PROPOFOL N/A 12/02/2020   Procedure: ESOPHAGOGASTRODUODENOSCOPY (EGD) WITH PROPOFOL;  Surgeon: Jaynie Collins, DO;  Location: [facility] ENDOSCOPY;  Service: Gastroenterology;  Laterality: N/A;   EYE SURGERY Bilateral 2001   cataract extractions   GREEN LIGHT LASER TURP (TRANSURETHRAL RESECTION OF PROSTATE N/A 11/21/2018   Procedure: GREEN LIGHT LASER TURP (TRANSURETHRAL RESECTION OF PROSTATE;  Surgeon: Orson Ape, MD;  Location: [facility] ORS;  Service: Urology;  Laterality: N/A;   HERNIA REPAIR Left 2010   inguinal  NASAL SINUS SURGERY  1997, 2015   x 2   PROSTATE SURGERY  2001, 2011   .  2nd surgery was a turp   PROSTATOTOMY  11/21/2018   TOTAL HIP ARTHROPLASTY Left 2016    Home Medications:  Allergies as of 08/02/2022       Reactions   Iodine Shortness Of Breath, Cough   Intravenous iodine.  No problem with betadine   Sulfa Antibiotics Hives, Swelling        Medication List        Accurate as of August 02, 2022  3:58 PM. If you have any questions, ask your nurse or doctor.           STOP taking these medications    beclomethasone 40 MCG/ACT inhaler Commonly known as: QVAR Stopped by: Daniel Altes, MD   ciclopirox 0.77 % cream Commonly known as: LOPROX Stopped by: Daniel Altes, MD   ciprofloxacin 500 MG tablet Commonly known as: CIPRO Stopped by: Daniel Altes, MD   docusate sodium 100 MG capsule Commonly known as: COLACE Stopped by: Daniel Altes, MD   FINASTERIDE PO Stopped by: Daniel Altes, MD   ketoconazole 2 % cream Commonly known as: NIZORAL Stopped by: Daniel Altes, MD   NAPROXEN PO Stopped by: Daniel Altes, MD   Stendra 200 MG Tabs Generic drug: Avanafil Stopped by: Daniel Altes, MD   terbinafine 250 MG tablet Commonly known as: LamISIL Stopped by: Daniel Altes, MD   Uribel 118 MG Caps Stopped by: Daniel Altes, MD       TAKE these medications    albuterol 108 (90 Base) MCG/ACT inhaler Commonly known as: VENTOLIN HFA Inhale 2 puffs into the lungs every 6 (six) hours as needed for wheezing or shortness of breath.   aspirin EC 81 MG tablet Take 81 mg by mouth daily. Swallow whole.   bifidobacterium infantis capsule Take 1 capsule by mouth daily. What changed: Another medication with the same name was removed. Continue taking this medication, and follow the directions you see here. Changed by: Daniel Altes, MD   levocetirizine 5 MG tablet Commonly known as: XYZAL Take 5 mg by mouth daily.   mepolizumab 100 MG injection Commonly known as: NUCALA Inject 100 mg into the skin every 30 (thirty) days.   montelukast 10 MG tablet Commonly known as: SINGULAIR Take 10 mg by mouth daily.   multivitamin with minerals Tabs tablet Take 1 tablet by mouth daily.   omeprazole 40 MG capsule Commonly known as: PRILOSEC Take 40 mg by mouth at bedtime.   OVER THE COUNTER MEDICATION 1 tablet at bedtime as needed. Sleep medicine from [facility]   tamsulosin 0.4 MG Caps capsule Commonly known as: FLOMAX Take  0.4 mg by mouth at bedtime.   valsartan 40 MG tablet Commonly known as: DIOVAN Take 40 mg by mouth daily.        Allergies:  Allergies  Allergen Reactions   Iodine Shortness Of Breath and Cough    Intravenous iodine.  No problem with betadine   Sulfa Antibiotics Hives and Swelling    Social History:  reports that he has never smoked. He has never used smokeless tobacco. He reports current alcohol use. He reports that he does not use drugs.   Physical Exam: BP 126/77   Pulse 80   Ht 5\' 10"  (1.778 m)   Wt 202 lb (91.6 kg)   BMI 28.98 kg/m   Constitutional:  Alert and oriented, No acute distress. HEENT: [place] AT, moist mucus membranes.  Trachea midline, no masses. Cardiovascular: No clubbing, cyanosis, or edema. Respiratory: Normal respiratory effort, no increased work of breathing. GU: Prostate 50 g smooth without nodules Psychiatric: Normal mood and affect.   Assessment & Plan:    1. BPH with LUTS Previously stable on Tamsulosin, however, recently with increased symptoms. Check UA. Continue Tamsulosin.  2. Elevated PSA Prostate MRI performed December 2023 for PSA at 6.8 showed no significant abnormalities. Most recent PSA was 5.51.  3.  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 They have remained stable on serial scans and will schedule a 9 months follow up MRI August 2024.   1 year follow up and instructed to call earlier for any significant changes.  I have reviewed the above documentation for accuracy and completeness, and I agree with the above.   Daniel Altes, MD  [facility] [address], [address] [place], [place] [address] [phone]

## 2022-08-08 ENCOUNTER — Other Ambulatory Visit: Payer: Self-pay | Admitting: Urology

## 2022-08-08 DIAGNOSIS — N390 Urinary tract infection, site not specified: Secondary | ICD-10-CM

## 2022-08-08 LAB — CULTURE, URINE COMPREHENSIVE

## 2022-08-18 ENCOUNTER — Telehealth: Payer: Self-pay

## 2022-08-18 ENCOUNTER — Other Ambulatory Visit: Payer: Self-pay | Admitting: *Deleted

## 2022-08-18 DIAGNOSIS — N390 Urinary tract infection, site not specified: Secondary | ICD-10-CM

## 2022-08-18 NOTE — Telephone Encounter (Signed)
Patient called stating he was seen on 08/02/22 and was treated for UTI and finished antibiotic. Patient states he is still having issues with frequency and urgency to urinate at night and having to strain to start urination, sometimes urine dribbles instead of having a stream. This has been an issue for a month. Wonders if the infection was not the cause of the symptoms? Asked if he can drop another UA off or any other suggestions. He is still taking Tamsulosin but does not feel like that is helping anymore as he mentioned during the visit.

## 2022-08-18 NOTE — Telephone Encounter (Signed)
Okay to do a follow-up UA.  Recommend cystoscopy if ua nml

## 2022-08-18 NOTE — Telephone Encounter (Signed)
Scheduled lab ua drop off

## 2022-08-22 ENCOUNTER — Other Ambulatory Visit: Payer: Medicare HMO

## 2022-08-22 DIAGNOSIS — N390 Urinary tract infection, site not specified: Secondary | ICD-10-CM

## 2022-08-22 LAB — MICROSCOPIC EXAMINATION: WBC, UA: >30 /hpf — AB (ref 0–5)

## 2022-08-22 LAB — URINALYSIS, COMPLETE
Bilirubin, UA: NEGATIVE
Glucose, UA: NEGATIVE
Ketones, UA: NEGATIVE
Nitrite, UA: NEGATIVE
Specific Gravity, UA: >1.03 — ABNORMAL HIGH (ref 1.005–1.030)
Urobilinogen, Ur: 0.2 mg/dL (ref 0.2–1.0)
pH, UA: 5.5 (ref 5.0–7.5)

## 2022-08-23 ENCOUNTER — Other Ambulatory Visit: Payer: Self-pay | Admitting: *Deleted

## 2022-08-24 MED ORDER — AMOXICILLIN 500 MG PO TABS: 500.0000 mg | ORAL_TABLET | Freq: Two times a day (BID) | ORAL | 0 refills | Status: AC

## 2022-08-24 NOTE — Progress Notes (Signed)
Repeat urinalysis looks like persistent infection.  Looks like urine culture was ordered.  Please send Rx amoxicillin 875 mg twice daily x 14 days.    Notified patient as instructed, patient pleased.

## 2022-08-24 NOTE — Addendum Note (Signed)
Addended by: Ples Specter L on: 08/24/2022 10:09 AM   Modules accepted: Orders

## 2022-08-25 LAB — CULTURE, URINE COMPREHENSIVE

## 2022-08-26 LAB — CULTURE, URINE COMPREHENSIVE

## 2022-08-31 ENCOUNTER — Ambulatory Visit: Payer: Medicare HMO | Admitting: Dermatology

## 2022-08-31 VITALS — BP 111/66 | HR 78

## 2022-08-31 DIAGNOSIS — L814 Other melanin hyperpigmentation: Secondary | ICD-10-CM | POA: Diagnosis not present

## 2022-08-31 DIAGNOSIS — L821 Other seborrheic keratosis: Secondary | ICD-10-CM | POA: Diagnosis not present

## 2022-08-31 DIAGNOSIS — W908XXA Exposure to other nonionizing radiation, initial encounter: Secondary | ICD-10-CM

## 2022-08-31 DIAGNOSIS — D485 Neoplasm of uncertain behavior of skin: Secondary | ICD-10-CM

## 2022-08-31 DIAGNOSIS — Z79899 Other long term (current) drug therapy: Secondary | ICD-10-CM

## 2022-08-31 DIAGNOSIS — Z5111 Encounter for antineoplastic chemotherapy: Secondary | ICD-10-CM

## 2022-08-31 DIAGNOSIS — D229 Melanocytic nevi, unspecified: Secondary | ICD-10-CM

## 2022-08-31 DIAGNOSIS — L570 Actinic keratosis: Secondary | ICD-10-CM

## 2022-08-31 DIAGNOSIS — Z7189 Other specified counseling: Secondary | ICD-10-CM

## 2022-08-31 DIAGNOSIS — L820 Inflamed seborrheic keratosis: Secondary | ICD-10-CM | POA: Diagnosis not present

## 2022-08-31 DIAGNOSIS — L578 Other skin changes due to chronic exposure to nonionizing radiation: Secondary | ICD-10-CM | POA: Diagnosis not present

## 2022-08-31 DIAGNOSIS — Z872 Personal history of diseases of the skin and subcutaneous tissue: Secondary | ICD-10-CM

## 2022-08-31 HISTORY — DX: Personal history of diseases of the skin and subcutaneous tissue: Z87.2

## 2022-08-31 MED ORDER — FLUOROURACIL 5 % EX CREA: TOPICAL_CREAM | CUTANEOUS | 0 refills | Status: DC

## 2022-08-31 NOTE — Progress Notes (Signed)
Follow-Up Visit   Subjective  Daniel Mason is a 77 y.o. male who presents for the following: Ak follow up, previously treated with LN2, patient here today for recheck and treatment if needed. The patient has spots, moles and lesions to be evaluated, some may be new or changing.  The following portions of the chart were reviewed this encounter and updated as appropriate: medications, allergies, medical history  Review of Systems:  No other skin or systemic complaints except as noted in HPI or Assessment and Plan.  Objective  Well appearing patient in no apparent distress; mood and affect are within normal limits. A focused examination was performed of the following areas: the face, scalp, ears, arms, and hands Relevant exam findings are noted in the Assessment and Plan.  Scalp x 2, R cheek x 1, L face x 10 (13) Pink scaly macules  scalp x 1 Erythematous keratotic or waxy stuck-on papule or plaque.   L ear and preauricular 1.7 cm hyperkeratotic patch.   Assessment & Plan   ACTINIC DAMAGE WITH PRECANCEROUS ACTINIC KERATOSES Counseling for Topical Chemotherapy Management: Patient exhibits: - Severe, confluent actinic changes with pre-cancerous actinic keratoses that is secondary to cumulative UV radiation exposure over time - Condition that is severe; chronic, not at goal. - diffuse scaly erythematous macules and papules with underlying dyspigmentation - Discussed Prescription "Field Treatment" topical Chemotherapy for Severe, Chronic Confluent Actinic Changes with Pre-Cancerous Actinic Keratoses Field treatment involves treatment of an entire area of skin that has confluent Actinic Changes (Sun/ Ultraviolet light damage) and PreCancerous Actinic Keratoses by method of PhotoDynamic Therapy (PDT) and/or prescription Topical Chemotherapy agents such as 5-fluorouracil, 5-fluorouracil/calcipotriene, and/or imiquimod.  The purpose is to decrease the number of clinically evident and  subclinical PreCancerous lesions to prevent progression to development of skin cancer by chemically destroying early precancer changes that may or may not be visible.  It has been shown to reduce the risk of developing skin cancer in the treated area. As a result of treatment, redness, scaling, crusting, and open sores may occur during treatment course. One or more than one of these methods may be used and may have to be used several times to control, suppress and eliminate the PreCancerous changes. Discussed treatment course, expected reaction, and possible side effects. - Recommend daily broad spectrum sunscreen SPF 30+ to sun-exposed areas, reapply every 2 hours as needed.  - Staying in the shade or wearing long sleeves, sun glasses (UVA+UVB protection) and wide brim hats (4-inch brim around the entire circumference of the hat) are also recommended. - Call for new or changing lesions. - In one month start 5FU/Calcipotriene mix BID x 7 days to the scalp.   SEBORRHEIC KERATOSIS - Stuck-on, waxy, tan-brown papules and/or plaques  - Benign-appearing - Discussed benign etiology and prognosis. - Observe - Call for any changes  LENTIGINES Exam: scattered tan macules Due to sun exposure Treatment Plan: Benign-appearing, observe. Recommend daily broad spectrum sunscreen SPF 30+ to sun-exposed areas, reapply every 2 hours as needed.  Call for any changes  MELANOCYTIC NEVI Exam: Tan-brown and/or pink-flesh-colored symmetric macules and papules  Treatment Plan: Benign appearing on exam today. Recommend observation. Call clinic for new or changing moles. Recommend daily use of broad spectrum spf 30+ sunscreen to sun-exposed areas.   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 They are chronic with  expected duration over 1 year. A portion of actinic keratoses  will progress to squamous cell carcinoma of the skin. It is not possible to reliably predict which spots will progress to skin cancer and so treatment is recommended to prevent development of skin cancer.  Recommend daily broad spectrum sunscreen SPF 30+ to sun-exposed areas, reapply every 2 hours as needed.  Recommend staying in the shade or wearing long sleeves, sun glasses (UVA+UVB protection) and wide brim hats (4-inch brim around the entire circumference of the hat). Call for new or changing lesions.   Destruction of lesion - Scalp x 2, R cheek x 1, L face x 10 (13) Complexity: simple   Destruction method: cryotherapy   Informed consent: discussed and consent obtained   Timeout:  patient name, date of birth, surgical site, and procedure verified Lesion destroyed using liquid nitrogen: Yes   Region frozen until ice ball extended beyond lesion: Yes   Outcome: patient tolerated procedure well with no complications   Post-procedure details: wound care instructions given    Inflamed seborrheic keratosis scalp x 1  Symptomatic, irritating, patient would like treated.   Destruction of lesion - scalp x 1 Complexity: simple   Destruction method: cryotherapy   Informed consent: discussed and consent obtained   Timeout:  patient name, date of birth, surgical site, and procedure verified Lesion destroyed using liquid nitrogen: Yes   Region frozen until ice ball extended beyond lesion: Yes   Outcome: patient tolerated procedure well with no complications   Post-procedure details: wound care instructions given    Neoplasm of uncertain behavior of skin L ear and preauricular  Epidermal / dermal shaving  Lesion diameter (cm):  1.7 Informed consent: discussed and consent obtained   Timeout: patient name, date of birth, surgical site, and procedure verified   Procedure prep:  Patient was prepped and draped in usual sterile fashion Prep type:  Isopropyl alcohol Anesthesia: the lesion was  anesthetized in a standard fashion   Anesthetic:  1% lidocaine w/ epinephrine 1-100,000 buffered w/ 8.4% NaHCO3 Instrument used: flexible razor blade   Hemostasis achieved with: pressure, aluminum chloride and electrodesiccation   Outcome: patient tolerated procedure well   Post-procedure details: sterile dressing applied and wound care instructions given   Dressing type: bandage and petrolatum    Destruction of lesion Complexity: extensive   Destruction method: electrodesiccation and curettage   Informed consent: discussed and consent obtained   Timeout:  patient name, date of birth, surgical site, and procedure verified Procedure prep:  Patient was prepped and draped in usual sterile fashion Prep type:  Isopropyl alcohol Anesthesia: the lesion was anesthetized in a standard fashion   Anesthetic:  1% lidocaine w/ epinephrine 1-100,000 buffered w/ 8.4% NaHCO3 Curettage performed in three different directions: Yes   Electrodesiccation performed over the curetted area: Yes   Lesion length (cm):  1.7 Lesion width (cm):  1.7 Margin per side (cm):  0.2 Final wound size (cm):  2.1 Hemostasis achieved with:  pressure, aluminum chloride and electrodesiccation Outcome: patient tolerated procedure well with no complications   Post-procedure details: sterile dressing applied and wound care instructions given   Dressing type: bandage and petrolatum    Specimen 1 - Surgical pathology Differential Diagnosis: D48.5 r/o hypertrophic AK vs SCC IS vs other ED&C today Check Margins: No  Actinic skin damage  Chemotherapy management, encounter for  Counseling and coordination of care  Medication management  Seborrheic keratosis  Lentigo  Melanocytic nevus, unspecified location   Return in  about 6 months (around 03/03/2023) for TBSE.  Maylene Roes, CMA, am acting as scribe for Armida Sans, MD .  Documentation: I have reviewed the above documentation for accuracy and completeness,  and I agree with the above.  Armida Sans, MD

## 2022-08-31 NOTE — Patient Instructions (Addendum)
5-Fluorouracil/Calcipotriene Patient Education   Actinic keratoses are the dry, red scaly spots on the skin caused by sun damage. A portion of these spots can turn into skin cancer with time, and treating them can help prevent development of skin cancer.   Treatment of these spots requires removal of the defective skin cells. There are various ways to remove actinic keratoses, including freezing with liquid nitrogen, treatment with creams, or treatment with a blue light procedure in the office.   5-fluorouracil cream is a topical cream used to treat actinic keratoses. It works by interfering with the growth of abnormal fast-growing skin cells, such as actinic keratoses. These cells peel off and are replaced by healthy ones. THIS CREAM SHOULD BE KEPT OUT OF REACH OF CHILDREN AND PETS AND SHOULD NOT BE USED BY PREGNANT WOMEN.  5-fluorouracil/calcipotriene is a combination of the 5-fluorouracil cream with a vitamin D analog cream called calcipotriene. The calcipotriene alone does not treat actinic keratoses. However, when it is combined with 5-fluorouracil, it helps the 5-fluorouracil treat the actinic keratoses much faster so that the same results can be achieved with a much shorter treatment time.  INSTRUCTIONS FOR 5-FLUOROURACIL/CALCIPOTRIENE CREAM:   5-fluorouracil/calcipotriene cream typically only needs to be used for 4-7 days. A thin layer should be applied twice a day to the treatment areas recommended by your physician.   If your physician prescribed you separate tubes of 5-fluourouracil and calcipotriene, apply a thin layer of 5-fluorouracil followed by a thin layer of calcipotriene.   Avoid contact with your eyes or nostrils. Avoid applying the cream to your eyelids or lips unless directed to apply there by your physician. Do not use 5-fluorouracil/calcipotriene cream on infected or open wounds.   You will develop redness, irritation and some crusting at areas where you have pre-cancer  damage/actinic keratoses. IF YOU DEVELOP PAIN, BLEEDING, OR SIGNIFICANT CRUSTING, STOP THE TREATMENT EARLY - you have already gotten a good response and the actinic keratoses should clear up well.  Wash your hands after applying 5-fluorouracil 5% cream on your skin.   A moisturizer or sunscreen with a minimum SPF 30 should be applied each morning.   Once you have finished the treatment, you can apply a thin layer of Vaseline twice a day to irritated areas to soothe and calm the areas more quickly. If you experience significant discomfort, contact your physician.  For some patients it is necessary to repeat the treatment for best results.  SIDE EFFECTS: When using 5-fluorouracil/calcipotriene cream, you may have mild irritation, such as redness, dryness, swelling, or a mild burning sensation. This usually resolves within 2 weeks. The more actinic keratoses you have, the more redness and inflammation you can expect during treatment. Eye irritation has been reported rarely. If this occurs, please let us know.   If you have any trouble using this cream, please send [place] a MyChart message or call the office. If you have any other questions about this information, please do not hesitate to ask me before you leave the office or contact me on MyChart or by phone.   Electrodesiccation and Curettage ("Scrape and Burn") Wound Care Instructions  Leave the original bandage on for 24 hours if possible.  If the bandage becomes soaked or soiled before that time, it is OK to remove it and examine the wound.  A small amount of post-operative bleeding is normal.  If excessive bleeding occurs, remove the bandage, place gauze over the site and apply continuous pressure (no peeking) over the area  for 30 minutes. If this does not work, please call our clinic as soon as possible or page your doctor if it is after hours.   Once a day, cleanse the wound with soap and water. It is fine to shower. If a thick crust develops you  may use a Q-tip dipped into dilute hydrogen peroxide (mix 1:1 with water) to dissolve it.  Hydrogen peroxide can slow the healing process, so use it only as needed.    After washing, apply petroleum jelly (Vaseline) or an antibiotic ointment if your doctor prescribed one for you, followed by a bandage.    For best healing, the wound should be covered with a layer of ointment at all times. If you are not able to keep the area covered with a bandage to hold the ointment in place, this may mean re-applying the ointment several times a day.  Continue this wound care until the wound has healed and is no longer open. It may take several weeks for the wound to heal and close.  Itching and mild discomfort is normal during the healing process.  If you have any discomfort, you can take Tylenol (acetaminophen) or ibuprofen as directed on the bottle. (Please do not take these if you have an allergy to them or cannot take them for another reason).  Some redness, tenderness and white or yellow material in the wound is normal healing.  If the area becomes very sore and red, or develops a thick yellow-green material (pus), it may be infected; please notify us.    Wound healing continues for up to one year following surgery. It is not unusual to experience pain in the scar from time to time during the interval.  If the pain becomes severe or the scar thickens, you should notify the office.    A slight amount of redness in a scar is expected for the first six months.  After six months, the redness will fade and the scar will soften and fade.  The color difference becomes less noticeable with time.  If there are any problems, return for a post-op surgery check at your earliest convenience.  To improve the appearance of the scar, you can use silicone scar gel, cream, or sheets (such as Mederma or Serica) every night for up to one year. These are available over the counter (without a prescription).  Please call our  office at [phone] for any questions or concerns.  Due to recent changes in healthcare laws, you may see results of your pathology and/or laboratory studies on MyChart before the doctors have had a chance to review them. We understand that in some cases there may be results that are confusing or concerning to you. Please understand that not all results are received at the same time and often the doctors may need to interpret multiple results in order to provide you with the best plan of care or course of treatment. Therefore, we ask that you please give [place] 2 business days to thoroughly review all your results before contacting the office for clarification. Should we see a critical lab result, you will be contacted sooner.   If You Need Anything After Your Visit  If you have any questions or concerns for your doctor, please call our main line at [phone] and press option 4 to reach your doctor's medical assistant. If no one answers, please leave a voicemail as directed and we will return your call as soon as possible. Messages left after 4 pm will  be answered the following business day.   You may also send [place] a message via MyChart. We typically respond to MyChart messages within 1-2 business days.  For prescription refills, please ask your pharmacy to contact our office. Our fax number is [phone].  If you have an urgent issue when the clinic is closed that cannot wait until the next business day, you can page your doctor at the number below.    Please note that while we do our best to be available for urgent issues outside of office hours, we are not available 24/7.   If you have an urgent issue and are unable to reach [place], you may choose to seek medical care at your doctor's office, retail clinic, urgent care center, or emergency room.  If you have a medical emergency, please immediately call 911 or go to the emergency department.  Pager Numbers  - Dr. Gwen Pounds: [phone]  -  Dr. Neale Burly: [phone]  - Dr. Roseanne Reno: [phone]  In the event of inclement weather, please call our main line at [phone] for an update on the status of any delays or closures.  Dermatology Medication Tips: Please keep the boxes that topical medications come in in order to help keep track of the instructions about where and how to use these. Pharmacies typically print the medication instructions only on the boxes and not directly on the medication tubes.   If your medication is too expensive, please contact our office at [phone] option 4 or send [place] a message through MyChart.   We are unable to tell what your co-pay for medications will be in advance as this is different depending on your insurance coverage. However, we may be able to find a substitute medication at lower cost or fill out paperwork to get insurance to cover a needed medication.   If a prior authorization is required to get your medication covered by your insurance company, please allow [place] 1-2 business days to complete this process.  Drug prices often vary depending on where the prescription is filled and some pharmacies may offer cheaper prices.  The website www.goodrx.com contains coupons for medications through different pharmacies. The prices here do not account for what the cost may be with help from insurance (it may be cheaper with your insurance), but the website can give you the price if you did not use any insurance.  - You can print the associated coupon and take it with your prescription to the pharmacy.  - You may also stop by our office during regular business hours and pick up a GoodRx coupon card.  - If you need your prescription sent electronically to a different pharmacy, notify our office through [facility] or by phone at [phone] option 4.     Si Usted Necesita Algo Despus de Su Visita  Tambin puede enviarnos un mensaje a travs de Clinical cytogeneticist. Por lo general respondemos a los  mensajes de MyChart en el transcurso de 1 a 2 das hbiles.  Para renovar recetas, por favor pida a su farmacia que se ponga en contacto con nuestra oficina. Annie Sable de fax es [place] [phone].  Si tiene un asunto urgente cuando la clnica est cerrada y que no puede esperar hasta el siguiente da hbil, puede llamar/localizar a su doctor(a) al nmero que aparece a continuacin.   Por favor, tenga en cuenta que aunque hacemos todo lo posible para estar disponibles para asuntos urgentes fuera del horario de oficina, no estamos disponibles las 24 horas del  da, los 7 das de la [place].   Si tiene un problema urgente y no puede comunicarse con nosotros, puede optar por buscar atencin mdica  en el consultorio de su doctor(a), en una clnica privada, en un centro de atencin urgente o en una sala de emergencias.  Si tiene Engineer, drilling, por favor llame inmediatamente al 911 o vaya a la sala de emergencias.  Nmeros de bper  - Dr. Gwen Pounds: [phone]  - Dra. Moye: [phone]  - Dra. Roseanne Reno: [phone]  En caso de inclemencias del [place], por favor llame a Lacy Duverney principal al [phone] para una actualizacin sobre el [place] de cualquier retraso o cierre.  Consejos para la medicacin en dermatologa: Por favor, guarde las cajas en las que vienen los medicamentos de uso tpico para ayudarle a seguir las instrucciones sobre dnde y cmo usarlos. Las farmacias generalmente imprimen las instrucciones del medicamento slo en las cajas y no directamente en los tubos del [place].   Si su medicamento es muy caro, por favor, pngase en contacto con Rolm Gala llamando al [phone] y presione la opcin 4 o envenos un mensaje a travs de Clinical cytogeneticist.   No podemos decirle cul ser su copago por los medicamentos por adelantado ya que esto es diferente dependiendo de la cobertura de su seguro. Sin embargo, es posible que podamos encontrar un medicamento sustituto a  Audiological scientist un formulario para que el seguro cubra el medicamento que se considera necesario.   Si se requiere una autorizacin previa para que su compaa de seguros [place] su medicamento, por favor permtanos de 1 a 2 das hbiles para completar [address]  Los precios de los medicamentos varan con frecuencia dependiendo del Environmental consultant de dnde se surte la receta y alguna farmacias pueden ofrecer precios ms baratos.  El sitio web www.goodrx.com tiene cupones para medicamentos de Health and safety inspector. Los precios aqu no tienen en cuenta lo que podra costar con la ayuda del seguro (puede ser ms barato con su seguro), pero el sitio web puede darle el precio si no utiliz Tourist information centre manager.  - Puede imprimir el cupn correspondiente y llevarlo con su receta a la farmacia.  - Tambin puede pasar por nuestra oficina durante el horario de atencin regular y Education officer, museum una tarjeta de cupones de GoodRx.  - Si necesita que su receta se enve electrnicamente a una farmacia diferente, informe a nuestra oficina a travs de MyChart de [place] o por telfono llamando al [phone] y presione la opcin 4.

## 2022-09-04 ENCOUNTER — Ambulatory Visit
Admission: RE | Admit: 2022-09-04 | Discharge: 2022-09-04 | Disposition: A | Discharge status: Home / Self Care | Payer: Medicare HMO | Source: Ambulatory Visit | Attending: Urology | Admitting: Urology

## 2022-09-04 ENCOUNTER — Encounter: Payer: Self-pay | Admitting: Dermatology

## 2022-09-04 DIAGNOSIS — N401 Enlarged prostate with lower urinary tract symptoms: Secondary | ICD-10-CM | POA: Diagnosis present

## 2022-09-04 MED ORDER — GADOBUTROL 1 MMOL/ML IV SOLN
9.0000 mL | Freq: Once | INTRAVENOUS | Status: AC | PRN
  Administered 2022-09-04: 9 mL via INTRAVENOUS

## 2022-09-08 ENCOUNTER — Encounter: Payer: Self-pay | Admitting: Dermatology

## 2022-09-12 ENCOUNTER — Telehealth: Payer: Self-pay

## 2022-09-12 NOTE — Telephone Encounter (Addendum)
Patient called regarding results. He verbalized understanding and denied further questions. 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

## 2022-10-11 ENCOUNTER — Encounter: Payer: Self-pay | Admitting: Urology

## 2022-10-11 ENCOUNTER — Ambulatory Visit: Payer: Medicare HMO | Admitting: Urology

## 2022-10-11 VITALS — BP 142/83 | HR 90 | Ht 70.0 in | Wt 209.0 lb

## 2022-10-11 DIAGNOSIS — N390 Urinary tract infection, site not specified: Secondary | ICD-10-CM

## 2022-10-11 DIAGNOSIS — N3289 Other specified disorders of bladder: Secondary | ICD-10-CM | POA: Diagnosis not present

## 2022-10-11 DIAGNOSIS — N401 Enlarged prostate with lower urinary tract symptoms: Secondary | ICD-10-CM | POA: Diagnosis not present

## 2022-10-11 NOTE — Progress Notes (Signed)
   10/11/22  CC:  Chief Complaint  Patient presents with   Cysto    HPI: Refer to prior office note 08/02/2022.  He has had persistent pyuria with negative urine cultures and cystoscopy was recommended.  He complains of nocturia anywhere from 6-15 times per night with complaints of urgency and small-volume voids.  Blood pressure (!) 142/83, pulse 90, height 5\' 10"  (1.778 m), weight 209 lb (94.8 kg). NED. A&Ox3.   No respiratory distress   Abd soft, NT, ND Normal phallus with bilateral descended testicles  Cystoscopy Procedure Note  Patient identification was confirmed, informed consent was obtained, and patient was prepped using Betadine solution.  Lidocaine jelly was administered per urethral meatus.     Pre-Procedure: - Inspection reveals a normal caliber urethral meatus.  Procedure: The flexible cystoscope was introduced without difficulty - No urethral strictures/lesions are present. -Prominent lateral lobe enlargement prostate with inflammatory changes prostatic urethral mucosa proximally. - Elevated bladder neck - Bilateral ureteral orifices identified - Bladder mucosa  reveals papillary tumor right anterior wall with surrounding bullous edema/calcification  Retroflexion shows suboptimal visualization secondary to backbleeding prostate   Post-Procedure: - Patient tolerated the procedure well  Assessment/ Plan: Abnormal cystoscopy with papillary lesion and bullous edema anterior bladder wall BPH with inflammatory change Urine was aspirated from the cystoscope and sent for cytology Findings were discussed in detail and recommend scheduling cystoscopy under anesthesia with TURBT/biopsy The procedure was discussed.   Riki Altes, MD

## 2022-10-11 NOTE — Progress Notes (Signed)
10/11/2022 4:34 PM   Daniel Mason February 21, 1945 [id number]  Referring provider: Dorothey Baseman, MD [phone] [place],  [place] [address]  Chief Complaint  Patient presents with   Cysto    HPI: Daniel Mason is a 77 y.o. seen today for cystoscopy with abnormal findings.  Refer to my previous note 08/02/2022 for review of past urologic history Cystoscopy is recommended for persistent sterile pyuria with findings of bladder tumor and bullous edema anterior bladder wall Urine cytology was collected and cystoscopy under anesthesia with TURBT/biopsy was recommended   PMH: Past Medical History:  Diagnosis Date   Allergy    Arthritis    Asthma    d/t a cough that he had for 30 years   Chronic bilateral low back pain with bilateral sciatica    DVT (deep venous thrombosis) (HCC) 04/2017   was on blood thinners until hematuria.  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   L lat abdomen, excised in [place] 04/30/19   Gallbladder disease    GERD (gastroesophageal reflux disease)    History of actinic keratoses 08/31/2022   left ear and preauricular bx proven treated already   History of BPH    History of cataract    History of kidney stones    bladder stones and 2 small kidney stones   History of pulmonary embolus (PE)    Hypertension    Tremor     Surgical History: Past Surgical History:  Procedure Laterality Date   CHOLECYSTECTOMY  2010   COLONOSCOPY WITH PROPOFOL N/A 12/02/2020   Procedure: COLONOSCOPY WITH PROPOFOL;  Surgeon: Jaynie Collins, DO;  Location: [facility] ENDOSCOPY;  Service: Gastroenterology;  Laterality: N/A;   CYSTOSCOPY WITH LITHOLAPAXY N/A 11/21/2018   Procedure: CYSTOSCOPY WITH LITHOLAPAXY;  Surgeon: Orson Ape, MD;  Location: [facility] ORS;  Service: Urology;  Laterality: N/A;   ESOPHAGOGASTRODUODENOSCOPY (EGD) WITH PROPOFOL N/A 12/02/2020   Procedure: ESOPHAGOGASTRODUODENOSCOPY (EGD) WITH PROPOFOL;  Surgeon: Jaynie Collins, DO;   Location: [facility] ENDOSCOPY;  Service: Gastroenterology;  Laterality: N/A;   EYE SURGERY Bilateral 2001   cataract extractions   GREEN LIGHT LASER TURP (TRANSURETHRAL RESECTION OF PROSTATE N/A 11/21/2018   Procedure: GREEN LIGHT LASER TURP (TRANSURETHRAL RESECTION OF PROSTATE;  Surgeon: Orson Ape, MD;  Location: [facility] ORS;  Service: Urology;  Laterality: N/A;   HERNIA REPAIR Left 2010   inguinal   NASAL SINUS SURGERY  1997, 2015   x 2   PROSTATE SURGERY  2001, 2011   .  2nd surgery was a turp   PROSTATOTOMY  11/21/2018   TOTAL HIP ARTHROPLASTY Left 2016    Home Medications:  Allergies as of 10/11/2022       Reactions   Iodine Shortness Of Breath, Cough   Intravenous iodine.  No problem with betadine   Sulfa Antibiotics Hives, Swelling        Medication List        Accurate as of October 11, 2022  4:34 PM. If you have any questions, ask your nurse or doctor.          STOP taking these medications    fluorouracil 5 % cream Commonly known as: EFUDEX Stopped by: Riki Altes       TAKE these medications    albuterol 108 (90 Base) MCG/ACT inhaler Commonly known as: VENTOLIN HFA Inhale 2 puffs into the lungs every 6 (six) hours as needed for wheezing or shortness of breath.   aspirin EC 81 MG  tablet Take 81 mg by mouth daily. Swallow whole.   bifidobacterium infantis capsule Take 1 capsule by mouth daily.   levocetirizine 5 MG tablet Commonly known as: XYZAL Take 5 mg by mouth daily.   mepolizumab 100 MG injection Commonly known as: NUCALA Inject 100 mg into the skin every 30 (thirty) days.   montelukast 10 MG tablet Commonly known as: SINGULAIR Take 10 mg by mouth daily.   multivitamin with minerals Tabs tablet Take 1 tablet by mouth daily.   omeprazole 40 MG capsule Commonly known as: PRILOSEC Take 40 mg by mouth at bedtime.   OVER THE COUNTER MEDICATION 1 tablet at bedtime as needed. Sleep medicine from [facility]   tamsulosin 0.4 MG Caps  capsule Commonly known as: FLOMAX Take 0.4 mg by mouth at bedtime.   valsartan 40 MG tablet Commonly known as: DIOVAN Take 40 mg by mouth daily.        Allergies:  Allergies  Allergen Reactions   Iodine Shortness Of Breath and Cough    Intravenous iodine.  No problem with betadine   Sulfa Antibiotics Hives and Swelling    Family History: History reviewed. No pertinent family history.  Social History:  reports that he has never smoked. He has never used smokeless tobacco. He reports current alcohol use. He reports that he does not use drugs.   Physical Exam: BP (!) 142/83   Pulse 90   Ht 5\' 10"  (1.778 m)   Wt 209 lb (94.8 kg)   BMI 29.99 kg/m   Constitutional:  Alert and oriented, No acute distress. HEENT: [place] AT Respiratory: Normal respiratory effort, no increased work of breathing. Psychiatric: Normal mood and affect.   Assessment & Plan:    1.  Bladder tumor/lesion TURBT/bladder biopsy is recommended.  The procedure has been discussed in detail including potential complications of bleeding, infection and rarely bladder injury. He will be contacted for scheduling   Riki Altes, MD  [facility] [address] [place], [place] [address] [phone]

## 2022-10-11 NOTE — H&P (View-Only) (Signed)
   10/11/22  CC:  Chief Complaint  Patient presents with   Cysto    HPI: Refer to prior office note 08/02/2022.  He has had persistent pyuria with negative urine cultures and cystoscopy was recommended.  He complains of nocturia anywhere from 6-15 times per night with complaints of urgency and small-volume voids.  Blood pressure (!) 142/83, pulse 90, height 5\' 10"  (1.778 m), weight 209 lb (94.8 kg). NED. A&Ox3.   No respiratory distress   Abd soft, NT, ND Normal phallus with bilateral descended testicles  Cystoscopy Procedure Note  Patient identification was confirmed, informed consent was obtained, and patient was prepped using Betadine solution.  Lidocaine jelly was administered per urethral meatus.     Pre-Procedure: - Inspection reveals a normal caliber urethral meatus.  Procedure: The flexible cystoscope was introduced without difficulty - No urethral strictures/lesions are present. -Prominent lateral lobe enlargement prostate with inflammatory changes prostatic urethral mucosa proximally. - Elevated bladder neck - Bilateral ureteral orifices identified - Bladder mucosa  reveals papillary tumor right anterior wall with surrounding bullous edema/calcification  Retroflexion shows suboptimal visualization secondary to backbleeding prostate   Post-Procedure: - Patient tolerated the procedure well  Assessment/ Plan: Abnormal cystoscopy with papillary lesion and bullous edema anterior bladder wall BPH with inflammatory change Urine was aspirated from the cystoscope and sent for cytology Findings were discussed in detail and recommend scheduling cystoscopy under anesthesia with TURBT/biopsy The procedure was discussed.   Riki Altes, MD

## 2022-10-12 LAB — MICROSCOPIC EXAMINATION: Epithelial Cells (non renal): >10 /HPF — AB (ref 0–10)

## 2022-10-12 LAB — URINALYSIS, COMPLETE
Bilirubin, UA: NEGATIVE
Glucose, UA: NEGATIVE
Ketones, UA: NEGATIVE
Leukocytes,UA: NEGATIVE
Nitrite, UA: NEGATIVE
RBC, UA: NEGATIVE
Specific Gravity, UA: >1.03 — ABNORMAL HIGH (ref 1.005–1.030)
Urobilinogen, Ur: 0.2 mg/dL (ref 0.2–1.0)
pH, UA: 5.5 (ref 5.0–7.5)

## 2022-10-16 ENCOUNTER — Telehealth: Payer: Self-pay

## 2022-10-16 NOTE — Telephone Encounter (Signed)
Pt LM on triage line stating that he was unsure if he needs an antibiotic based on his UA. Advised pt no antibiotic needed at this time. Will call with urine cytology results. Pt voiced understanding.

## 2022-10-22 ENCOUNTER — Other Ambulatory Visit: Payer: Self-pay | Admitting: Urology

## 2022-10-22 DIAGNOSIS — D494 Neoplasm of unspecified behavior of bladder: Secondary | ICD-10-CM

## 2022-10-23 ENCOUNTER — Other Ambulatory Visit: Payer: Self-pay

## 2022-10-23 ENCOUNTER — Telehealth: Payer: Self-pay

## 2022-10-23 DIAGNOSIS — D494 Neoplasm of unspecified behavior of bladder: Secondary | ICD-10-CM

## 2022-10-23 NOTE — Progress Notes (Signed)
Surgical Physician Order Form [facility]  Dr. Irineo Axon, MD  * Scheduling expectation : Next Available  *Length of Case: 60 min  *Clearance needed: no  *Anticoagulation Instructions: N/A  *Aspirin Instructions: Hold Aspirin  *Post-op visit Date/Instructions:  1-2 week with pathology review  *Diagnosis: Bladder Tumor  *Procedure:  TURBT 2-5cm (16109)   Additional orders: Gemcitabine 2000mg  bladder instillation  -Admit type: OUTpatient  -Anesthesia: Choice  -VTE Prophylaxis Standing Order SCD's       Other:   -Standing Lab Orders Per Anesthesia    Lab other: UA&Urine Culture  -Standing Test orders EKG/Chest x-ray per Anesthesia       Test other:   - Medications:  Ancef 2gm IV  -Other orders:  N/A

## 2022-10-23 NOTE — Telephone Encounter (Signed)
Per Dr. Lonna Cobb, Patient is to be scheduled for Transurethral Resection of Bladder Tumor with Intravesical Instillation of Gemcitabine   Mr. Howren was contacted and possible surgical dates were discussed, Tuesday October 1st, 2024 was agreed upon for surgery.   Patient was instructed that Dr. Lonna Cobb will require them to provide a pre-op UA & CX prior to surgery. This was ordered and scheduled drop off appointment was made for 10/25/2022.    Patient was directed to call [phone] between 1-3pm the day before surgery to find out surgical arrival time.  Instructions were given not to eat or drink from midnight on the night before surgery and have a driver for the day of surgery. On the surgery day patient was instructed to enter through the [facility] entrance of [facility] report the Same Day Surgery desk.   Pre-Admit Testing will be in contact via phone to set up an interview with the anesthesia team to review your history and medications prior to surgery.   Reminder of this information was sent via MyChart to the patient.

## 2022-10-23 NOTE — Progress Notes (Signed)
   [facility] Posting Form  Surgery Date: Date: 11/07/2022  Surgeon: Dr. Irineo Axon, MD  Inpt ( No  )   Outpt (Yes)   Obs ( No  )   Diagnosis: D49.4 Bladder Tumor  -CPT: 16109, [phone]  Surgery: Transurethral Resection of Bladder Tumor with Intravesical Instillation of Gemcitabine  Stop Anticoagulations: Yes, will need to hold ASA  Cardiac/Medical/Pulmonary Clearance needed: no  *Orders entered into EPIC  Date: 10/23/22   *Case booked in [place]  Date: 10/23/22  *Notified pt of Surgery: Date: 10/23/22  PRE-OP UA & CX: yes, will obtain in clinic on 10/25/2022  *Placed into Prior Authorization Work Angela Nevin Date: 10/23/22  Assistant/laser/rep:No

## 2022-10-25 ENCOUNTER — Other Ambulatory Visit: Payer: Medicare HMO

## 2022-10-25 DIAGNOSIS — D494 Neoplasm of unspecified behavior of bladder: Secondary | ICD-10-CM

## 2022-10-25 LAB — MICROSCOPIC EXAMINATION

## 2022-10-25 LAB — URINALYSIS, COMPLETE
Bilirubin, UA: NEGATIVE
Glucose, UA: NEGATIVE
Ketones, UA: NEGATIVE
Nitrite, UA: NEGATIVE
Specific Gravity, UA: 1.015 (ref 1.005–1.030)
Urobilinogen, Ur: 0.2 mg/dL (ref 0.2–1.0)
pH, UA: 5.5 (ref 5.0–7.5)

## 2022-10-26 ENCOUNTER — Telehealth: Payer: Self-pay | Admitting: *Deleted

## 2022-10-26 NOTE — Telephone Encounter (Signed)
Pt calling asking if he will be getting a abx for this pre-op urine cx and UA?   Left message on his VM that we are waiting on the final urine culture and this will take a few days. We will call when we get the final results.

## 2022-10-27 NOTE — Telephone Encounter (Signed)
Patient left message stating that he is leaving for [place] today and when culture is back and if he needs medication to let him know and he will let us know what pharmacy to use.

## 2022-10-28 LAB — CULTURE, URINE COMPREHENSIVE

## 2022-10-28 NOTE — Telephone Encounter (Signed)
Culture grew mixed flora and no antibiotic needed.  He will receive preoperative IV antibiotics

## 2022-10-30 ENCOUNTER — Encounter
Admission: RE | Admit: 2022-10-30 | Discharge: 2022-10-30 | Disposition: A | Discharge status: Home / Self Care | Payer: Medicare HMO | Source: Ambulatory Visit | Attending: Urology | Admitting: Urology

## 2022-10-30 VITALS — Ht 70.0 in | Wt 218.3 lb

## 2022-10-30 DIAGNOSIS — I10 Essential (primary) hypertension: Secondary | ICD-10-CM

## 2022-10-30 DIAGNOSIS — Z01810 Encounter for preprocedural cardiovascular examination: Secondary | ICD-10-CM

## 2022-10-30 DIAGNOSIS — Z01812 Encounter for preprocedural laboratory examination: Secondary | ICD-10-CM

## 2022-10-30 DIAGNOSIS — D696 Thrombocytopenia, unspecified: Secondary | ICD-10-CM

## 2022-10-30 HISTORY — DX: Thrombocytopenia, unspecified: D69.6

## 2022-10-30 HISTORY — DX: Personal history of other diseases of the digestive system: Z87.19

## 2022-10-30 HISTORY — DX: Eosinophilic asthma: J82.83

## 2022-10-30 HISTORY — DX: Neoplasm of unspecified behavior of bladder: D49.4

## 2022-10-30 HISTORY — DX: Chronic cough: R05.3

## 2022-10-30 NOTE — Patient Instructions (Addendum)
Your procedure is scheduled on:11-07-22 Tuesday Report to the Registration Desk on the 1st floor of the Medical Mall.Then proceed to the 2nd floor Surgery Desk To find out your arrival time, please call [phone] between 1PM - 3PM on:11-06-22 Monday If your arrival time is 6:00 am, do not arrive before that time as the Medical Mall entrance doors do not open until 6:00 am.  REMEMBER: Instructions that are not followed completely may result in serious medical risk, up to and including death; or upon the discretion of your surgeon and anesthesiologist your surgery may need to be rescheduled.  Do not eat food OR drink any liquids after midnight the night before surgery.  No gum chewing or hard candies.  One week prior to surgery: Stop Anti-inflammatories (NSAIDS) such as Advil, Aleve, Ibuprofen, Motrin, Naproxen, Naprosyn and Aspirin based products such as Excedrin, Goody's Powder, BC Powder.You may however, take Tylenol if needed for pain up until the day of surgery. Stop ANY OVER THE COUNTER supplements/vitamins NOW (10-30-22) until after surgery (multivitamin, Align Probiotic)   Continue taking all prescribed medications with the exception of the following: -Aspirin-Stop 5 days prior to surgery as instructed by Dr Heywood Footman office-Last dose will be on 11-01-22 Wednesday  TAKE ONLY THESE MEDICATIONS THE MORNING OF SURGERY WITH A SIP OF WATER: -omeprazole (PRILOSEC)  Use your Albuterol Inhaler the day of surgery and bring your Inhaler to the hospital  No Alcohol for 24 hours before or after surgery.  No Smoking including e-cigarettes for 24 hours before surgery.  No chewable tobacco products for at least 6 hours before surgery.  No nicotine patches on the day of surgery.  Do not use any "recreational" drugs for at least a week (preferably 2 weeks) before your surgery.  Please be advised that the combination of cocaine and anesthesia may have negative outcomes, up to and including  death. If you test positive for cocaine, your surgery will be cancelled.  On the morning of surgery brush your teeth with toothpaste and water, you may rinse your mouth with mouthwash if you wish. Do not swallow any toothpaste or mouthwash.  Do not wear jewelry, make-up, hairpins, clips or nail polish.  For welded (permanent) jewelry: bracelets, anklets, waist bands, etc.  Please have this removed prior to surgery.  If it is not removed, there is a chance that hospital personnel will need to cut it off on the day of surgery.  Do not wear lotions, powders, or perfumes.   Do not shave body hair from the neck down 48 hours before surgery.  Contact lenses, hearing aids and dentures may not be worn into surgery.  Do not bring valuables to the hospital. [facility] is not responsible for any missing/lost belongings or valuables.   Notify your doctor if there is any change in your medical condition (cold, fever, infection).  Wear comfortable clothing (specific to your surgery type) to the hospital.  After surgery, you can help prevent lung complications by doing breathing exercises.  Take deep breaths and cough every 1-2 hours. Your doctor may order a device called an Incentive Spirometer to help you take deep breaths. When coughing or sneezing, hold a pillow firmly against your incision with both hands. This is called "splinting." Doing this helps protect your incision. It also decreases belly discomfort.  If you are being admitted to the hospital overnight, leave your suitcase in the car. After surgery it may be brought to your room.  In case of increased patient census,  it may be necessary for you, the patient, to continue your postoperative care in the Same Day Surgery department.  If you are being discharged the day of surgery, you will not be allowed to drive home. You will need a responsible individual to drive you home and stay with you for 24 hours after surgery.   If you are  taking public transportation, you will need to have a responsible individual with you.  Please call the Pre-admissions Testing Dept. at [phone] if you have any questions about these instructions.  Surgery Visitation Policy:  Patients having surgery or a procedure may have two visitors.  Children under the age of 74 must have an adult with them who is not the patient.

## 2022-10-30 NOTE — Pre-Procedure Instructions (Addendum)
Pt needing to come in for labs and EKG prior to upcoming 11-07-22 surgery with Stoioff. Pt states he is out of town and wont be coming back in to town until Friday afternoon on 11-03-22. Pt states he can come in for labs and EKG Friday afternoon. Appt made 11-03-22 @ 1430

## 2022-11-03 ENCOUNTER — Encounter
Admission: RE | Admit: 2022-11-03 | Discharge: 2022-11-03 | Disposition: A | Discharge status: Home / Self Care | Payer: Medicare HMO | Source: Ambulatory Visit | Attending: Urology | Admitting: Urology

## 2022-11-03 ENCOUNTER — Encounter: Payer: Self-pay | Admitting: Urgent Care

## 2022-11-03 DIAGNOSIS — Z01810 Encounter for preprocedural cardiovascular examination: Secondary | ICD-10-CM | POA: Insufficient documentation

## 2022-11-03 DIAGNOSIS — D696 Thrombocytopenia, unspecified: Secondary | ICD-10-CM | POA: Insufficient documentation

## 2022-11-03 DIAGNOSIS — Z01812 Encounter for preprocedural laboratory examination: Secondary | ICD-10-CM | POA: Insufficient documentation

## 2022-11-03 DIAGNOSIS — R9431 Abnormal electrocardiogram [ECG] [EKG]: Secondary | ICD-10-CM | POA: Insufficient documentation

## 2022-11-03 DIAGNOSIS — I10 Essential (primary) hypertension: Secondary | ICD-10-CM | POA: Diagnosis not present

## 2022-11-03 DIAGNOSIS — Z01818 Encounter for other preprocedural examination: Secondary | ICD-10-CM | POA: Diagnosis present

## 2022-11-03 LAB — BASIC METABOLIC PANEL
Anion gap: 8 (ref 5–15)
BUN: 17 mg/dL (ref 8–23)
CO2: 22 mmol/L (ref 22–32)
Calcium: 8.1 mg/dL — ABNORMAL LOW (ref 8.9–10.3)
Chloride: 109 mmol/L (ref 98–111)
Creatinine, Ser: 1.18 mg/dL (ref 0.61–1.24)
GFR, Estimated: >60 mL/min (ref >60)
Glucose, Bld: 127 mg/dL — ABNORMAL HIGH (ref 70–99)
Potassium: 3.9 mmol/L (ref 3.5–5.1)
Sodium: 139 mmol/L (ref 135–145)

## 2022-11-03 LAB — CBC
HCT: 43.9 % (ref 39.0–52.0)
Hemoglobin: 15.4 g/dL (ref 13.0–17.0)
MCH: 30.1 pg (ref 26.0–34.0)
MCHC: 35.1 g/dL (ref 30.0–36.0)
MCV: 85.7 fL (ref 80.0–100.0)
Platelets: 208 10*3/uL (ref 150–400)
RBC: 5.12 MIL/uL (ref 4.22–5.81)
RDW: 12.9 % (ref 11.5–15.5)
WBC: 5.6 10*3/uL (ref 4.0–10.5)
nRBC: 0 % (ref 0.0–0.2)

## 2022-11-07 ENCOUNTER — Encounter
Admission: RE | Disposition: A | Discharge status: Home / Self Care | Payer: Self-pay | Source: Home / Self Care | Attending: Urology

## 2022-11-07 ENCOUNTER — Ambulatory Visit
Admission: RE | Admit: 2022-11-07 | Discharge: 2022-11-07 | Disposition: A | Discharge status: Home / Self Care | Payer: Medicare HMO | Attending: Urology | Admitting: Urology

## 2022-11-07 ENCOUNTER — Encounter: Payer: Self-pay | Admitting: Urology

## 2022-11-07 ENCOUNTER — Other Ambulatory Visit: Payer: Self-pay

## 2022-11-07 ENCOUNTER — Ambulatory Visit: Payer: Self-pay

## 2022-11-07 ENCOUNTER — Ambulatory Visit: Payer: Medicare HMO | Admitting: Urgent Care

## 2022-11-07 DIAGNOSIS — N400 Enlarged prostate without lower urinary tract symptoms: Secondary | ICD-10-CM | POA: Diagnosis not present

## 2022-11-07 DIAGNOSIS — R399 Unspecified symptoms and signs involving the genitourinary system: Secondary | ICD-10-CM

## 2022-11-07 DIAGNOSIS — M1990 Unspecified osteoarthritis, unspecified site: Secondary | ICD-10-CM | POA: Insufficient documentation

## 2022-11-07 DIAGNOSIS — K219 Gastro-esophageal reflux disease without esophagitis: Secondary | ICD-10-CM | POA: Insufficient documentation

## 2022-11-07 DIAGNOSIS — N419 Inflammatory disease of prostate, unspecified: Secondary | ICD-10-CM | POA: Diagnosis present

## 2022-11-07 DIAGNOSIS — I10 Essential (primary) hypertension: Secondary | ICD-10-CM | POA: Diagnosis not present

## 2022-11-07 DIAGNOSIS — D494 Neoplasm of unspecified behavior of bladder: Secondary | ICD-10-CM

## 2022-11-07 DIAGNOSIS — Z86711 Personal history of pulmonary embolism: Secondary | ICD-10-CM | POA: Insufficient documentation

## 2022-11-07 DIAGNOSIS — N4289 Other specified disorders of prostate: Secondary | ICD-10-CM

## 2022-11-07 DIAGNOSIS — J8283 Eosinophilic asthma: Secondary | ICD-10-CM | POA: Diagnosis not present

## 2022-11-07 DIAGNOSIS — Z86718 Personal history of other venous thrombosis and embolism: Secondary | ICD-10-CM | POA: Insufficient documentation

## 2022-11-07 DIAGNOSIS — Z87442 Personal history of urinary calculi: Secondary | ICD-10-CM | POA: Insufficient documentation

## 2022-11-07 HISTORY — PX: TRANSURETHRAL RESECTION OF PROSTATE: SHX73

## 2022-11-07 SURGERY — TURP (TRANSURETHRAL RESECTION OF PROSTATE)
Anesthesia: General

## 2022-11-07 MED ORDER — LIDOCAINE HCL (PF) 2 % IJ SOLN
INTRAMUSCULAR | Status: AC
  Filled 2022-11-07: qty 5

## 2022-11-07 MED ORDER — FENTANYL CITRATE (PF) 100 MCG/2ML IJ SOLN
INTRAMUSCULAR | Status: AC
  Filled 2022-11-07: qty 2

## 2022-11-07 MED ORDER — EPHEDRINE 5 MG/ML INJ
INTRAVENOUS | Status: AC
  Filled 2022-11-07: qty 5

## 2022-11-07 MED ORDER — CHLORHEXIDINE GLUCONATE 0.12 % MT SOLN
OROMUCOSAL | Status: AC
  Filled 2022-11-07: qty 15

## 2022-11-07 MED ORDER — PROPOFOL 10 MG/ML IV BOLUS
INTRAVENOUS | Status: DC | PRN
  Administered 2022-11-07: 200 mg via INTRAVENOUS

## 2022-11-07 MED ORDER — EPHEDRINE SULFATE (PRESSORS) 50 MG/ML IJ SOLN
INTRAMUSCULAR | Status: DC | PRN
Start: 2022-11-07 — End: 2022-11-07
  Administered 2022-11-07: 5 mg via INTRAVENOUS

## 2022-11-07 MED ORDER — CEFAZOLIN SODIUM-DEXTROSE 2-4 GM/100ML-% IV SOLN
INTRAVENOUS | Status: AC
  Filled 2022-11-07: qty 100

## 2022-11-07 MED ORDER — CHLORHEXIDINE GLUCONATE 0.12 % MT SOLN
15.0000 mL | Freq: Once | OROMUCOSAL | Status: AC
  Administered 2022-11-07: 15 mL via OROMUCOSAL

## 2022-11-07 MED ORDER — LIDOCAINE HCL (CARDIAC) PF 100 MG/5ML IV SOSY
PREFILLED_SYRINGE | INTRAVENOUS | Status: DC | PRN
  Administered 2022-11-07: 100 mg via INTRATRACHEAL

## 2022-11-07 MED ORDER — PROPOFOL 10 MG/ML IV BOLUS
INTRAVENOUS | Status: AC
  Filled 2022-11-07: qty 40

## 2022-11-07 MED ORDER — SODIUM CHLORIDE 0.9 % IR SOLN
Status: DC | PRN
  Administered 2022-11-07: 3000 mL

## 2022-11-07 MED ORDER — ONDANSETRON HCL 4 MG/2ML IJ SOLN
INTRAMUSCULAR | Status: DC | PRN
  Administered 2022-11-07: 4 mg via INTRAVENOUS

## 2022-11-07 MED ORDER — GEMCITABINE CHEMO FOR BLADDER INSTILLATION 2000 MG
2000.0000 mg | Freq: Once | INTRAVENOUS | Status: DC
  Filled 2022-11-07: qty 52.6

## 2022-11-07 MED ORDER — DEXAMETHASONE SODIUM PHOSPHATE 10 MG/ML IJ SOLN
INTRAMUSCULAR | Status: AC
  Filled 2022-11-07: qty 1

## 2022-11-07 MED ORDER — CEFAZOLIN SODIUM-DEXTROSE 2-4 GM/100ML-% IV SOLN
2.0000 g | INTRAVENOUS | Status: AC
  Administered 2022-11-07: 2 g via INTRAVENOUS

## 2022-11-07 MED ORDER — FENTANYL CITRATE (PF) 100 MCG/2ML IJ SOLN: 25.0000 ug | INTRAMUSCULAR | Status: DC | PRN

## 2022-11-07 MED ORDER — FENTANYL CITRATE (PF) 100 MCG/2ML IJ SOLN
INTRAMUSCULAR | Status: DC | PRN
  Administered 2022-11-07: 50 ug via INTRAVENOUS

## 2022-11-07 MED ORDER — ONDANSETRON HCL 4 MG/2ML IJ SOLN
INTRAMUSCULAR | Status: AC
  Filled 2022-11-07: qty 2

## 2022-11-07 MED ORDER — ORAL CARE MOUTH RINSE: 15.0000 mL | Freq: Once | OROMUCOSAL | Status: AC

## 2022-11-07 MED ORDER — DEXAMETHASONE SODIUM PHOSPHATE 10 MG/ML IJ SOLN
INTRAMUSCULAR | Status: DC | PRN
Start: 2022-11-07 — End: 2022-11-07
  Administered 2022-11-07: 5 mg via INTRAVENOUS

## 2022-11-07 MED ORDER — LACTATED RINGERS IV SOLN: INTRAVENOUS | Status: DC

## 2022-11-07 MED ORDER — TRAMADOL HCL 50 MG PO TABS
50.0000 mg | ORAL_TABLET | Freq: Four times a day (QID) | ORAL | 0 refills | Status: DC | PRN
Start: 2022-11-07 — End: 2023-05-16

## 2022-11-07 MED ORDER — GLYCOPYRROLATE 0.2 MG/ML IJ SOLN
INTRAMUSCULAR | Status: DC | PRN
  Administered 2022-11-07: .2 mg via INTRAVENOUS

## 2022-11-07 MED ORDER — DROPERIDOL 2.5 MG/ML IJ SOLN: 0.6250 mg | Freq: Once | INTRAMUSCULAR | Status: DC | PRN

## 2022-11-07 SURGICAL SUPPLY — 25 items
BAG DRAIN SIEMENS DORNER NS (MISCELLANEOUS) ×2 IMPLANT
BAG DRN NS LF (MISCELLANEOUS) ×2
BAG DRN RND TRDRP ANRFLXCHMBR (UROLOGICAL SUPPLIES) ×2
BAG URINE DRAIN 2000ML AR STRL (UROLOGICAL SUPPLIES) ×2 IMPLANT
CATH FOLEY 2WAY 18X30 (CATHETERS) IMPLANT
CATH FOLEY 2WAY SIL 18X30 (CATHETERS)
DRAPE UTILITY 15X26 TOWEL STRL (DRAPES) ×2 IMPLANT
DRSG TELFA 3X4 N-ADH STERILE (GAUZE/BANDAGES/DRESSINGS) ×2 IMPLANT
ELECT LOOP 22F BIPOLAR SML (ELECTROSURGICAL)
ELECT REM PT RETURN 9FT ADLT (ELECTROSURGICAL)
ELECTRODE LOOP 22F BIPOLAR SML (ELECTROSURGICAL) IMPLANT
ELECTRODE REM PT RTRN 9FT ADLT (ELECTROSURGICAL) IMPLANT
GLOVE BIOGEL PI IND STRL 7.5 (GLOVE) ×2 IMPLANT
GOWN STRL REUS W/ TWL LRG LVL3 (GOWN DISPOSABLE) ×2 IMPLANT
GOWN STRL REUS W/TWL LRG LVL3 (GOWN DISPOSABLE) ×2
GOWN STRL REUS W/TWL XL LVL4 (GOWN DISPOSABLE) ×2 IMPLANT
IV NS IRRIG 3000ML ARTHROMATIC (IV SOLUTION) ×4 IMPLANT
KIT TURNOVER CYSTO (KITS) ×2 IMPLANT
LOOP CUT BIPOLAR 24F LRG (ELECTROSURGICAL) ×1 IMPLANT
PACK CYSTO AR (MISCELLANEOUS) ×2 IMPLANT
SET IRRIG Y TYPE TUR BLADDER L (SET/KITS/TRAYS/PACK) ×2 IMPLANT
SURGILUBE 2OZ TUBE FLIPTOP (MISCELLANEOUS) ×2 IMPLANT
SYR TOOMEY IRRIG 70ML (MISCELLANEOUS) ×2
SYRINGE TOOMEY IRRIG 70ML (MISCELLANEOUS) ×2 IMPLANT
WATER STERILE IRR 500ML POUR (IV SOLUTION) ×2 IMPLANT

## 2022-11-07 NOTE — Transfer of Care (Signed)
Immediate Anesthesia Transfer of Care Note  Patient: Daniel Mason  Procedure(s) Performed: TRANSURETHRAL RESECTION OF THE PROSTATE (TURP)  Patient Location: PACU  Anesthesia Type:General  Level of Consciousness: sedated  Airway & Oxygen Therapy: Patient Spontanous Breathing and Patient connected to face mask oxygen  Post-op Assessment: Report given to RN and Post -op Vital signs reviewed and stable  Post vital signs: Reviewed and stable  Last Vitals:  Vitals Value Taken Time  BP 144/84 11/07/22 0903  Temp    Pulse 92 11/07/22 0903  Resp 14 11/07/22 0903  SpO2 100 % 11/07/22 0903    Last Pain:  Vitals:   11/07/22 0738  TempSrc: Temporal         Complications: No notable events documented.

## 2022-11-07 NOTE — Op Note (Signed)
   Preoperative diagnosis:  Abnormal prostate tissue; possible bladder tumor  Postoperative diagnosis:  Abnormal prostate tissue  Procedure: Transurethral resection of abnormal appearing prostate tissue  Surgeon: Riki Altes, MD  Anesthesia: General  Complications: None  Intraoperative findings:  Cystoscopy: Urethra normal appearing without stricture; erythematous median lobe with inflammatory changes.  Inflammatory changes prostatic urethra at bladder neck; mild-moderate bladder trabeculation, no bladder mucosal abnormalities including erythema, solid or papillary lesions  EBL: Minimal  Specimens: Prostate tissue  Indication: Daniel Mason is a 77 y.o. found on recent cystoscopy to have possible bladder tumor and inflammatory changes prostate.  Suboptimal visualization on office cystoscopy and he presents for cystoscopy under anesthesia with possible TURBT.  After reviewing the management options for treatment, he elected to proceed with the above surgical procedure(s). We have discussed the potential benefits and risks of the procedure, side effects of the proposed treatment, the likelihood of the patient achieving the goals of the procedure, and any potential problems that might occur during the procedure or recuperation. Informed consent has been obtained.  Description of procedure:  The patient was taken to the operating room and general anesthesia was induced.  The patient was placed in the dorsal lithotomy position, prepped and draped in the usual sterile fashion, and preoperative antibiotics were administered. A preoperative time-out was performed.   A 21 French cystoscope was lubricated, inserted per urethra and advanced proximally into the bladder under direct vision with findings as described above.  The cystoscope was then removed and replaced with a 26 French continuous-flow resectoscope sheath with obturator.  The obturator was removed and an Iglesias resectoscope with  loop was placed into the sheath.  The median lobe tissue was resected down to bladder neck hemostasis was obtained with cautery.  Additional inflammatory tissue at the right bladder neck was resected as well as inflammatory tissue anterior aspect of the proximal prostatic urethra.  The purpose of the resection was removal of the abnormal appearing prostate tissue and not for obstructive voiding symptoms.  Hemostasis was obtained with cautery.  All specimen was removed by irrigation  Resection sites were reinspected and hemostasis was excellent.  The resectoscope was removed and an 65 French Foley catheter was placed with return of clear effluent upon irrigation.  Plan: Follow-up visit 11/09/2022 for catheter removal   Riki Altes, M.D.

## 2022-11-07 NOTE — Interval H&P Note (Signed)
History and Physical Interval Note:  11/07/2022 7:59 AM  [name]  has presented today for surgery, with the diagnosis of Bladder Tumor.  The various methods of treatment have been discussed with the patient and family. After consideration of risks, benefits and other options for treatment, the patient has consented to  Procedure(s): TRANSURETHRAL RESECTION OF BLADDER TUMOR (TURBT) (N/A) BLADDER INSTILLATION OF GEMCITABINE (N/A) as a surgical intervention.  The patient's history has been reviewed, patient examined, no change in status, stable for surgery.  I have reviewed the patient's chart and labs.  Questions were answered to the patient's satisfaction.    CV:RRR Lungs:clear  Riki Altes

## 2022-11-07 NOTE — Discharge Instructions (Addendum)

## 2022-11-07 NOTE — Anesthesia Preprocedure Evaluation (Signed)
Anesthesia Evaluation  Patient identified by MRN, date of birth, ID band Patient awake    History of Anesthesia Complications Negative for: history of anesthetic complications  Airway Mallampati: III       Dental  (+) Teeth Intact, Dental Advidsory Given   Pulmonary neg shortness of breath, asthma , neg recent URI    + decreased breath sounds      Cardiovascular Exercise Tolerance: Good hypertension, Pt. on medications (-) angina (-) Past MI and (-) Cardiac Stents (-) dysrhythmias (-) Valvular Problems/Murmurs Rhythm:Regular Rate:Normal     Neuro/Psych negative neurological ROS  negative psych ROS   GI/Hepatic Neg liver ROS,GERD  ,,  Endo/Other  negative endocrine ROS    Renal/GU negative Renal ROS  negative genitourinary   Musculoskeletal  (+) Arthritis ,    Abdominal Normal abdominal exam  (+)   Peds negative pediatric ROS (+)  Hematology negative hematology ROS (+)   Anesthesia Other Findings Past Medical History: No date: Allergy No date: Arthritis No date: Bladder tumor No date: Chronic bilateral low back pain with bilateral sciatica No date: Chronic cough 04/2017: DVT (deep venous thrombosis) (HCC)     Comment:  was on blood thinners until hematuria.  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 No date: Gallbladder disease No date: GERD (gastroesophageal reflux disease) 08/31/2022: History of actinic keratoses     Comment:  left ear and preauricular bx proven treated already No date: History of Barrett's esophagus No date: History of BPH No date: History of cataract No date: History of kidney stones     Comment:  bladder stones and 2 small kidney stones No date: History of pulmonary embolus (PE) No date: Hypertension No date: Thrombocytopenia (HCC) No date: Tremor    Reproductive/Obstetrics negative OB ROS                             Anesthesia Physical Anesthesia Plan  ASA: 3  Anesthesia Plan: General   Post-op Pain Management:    Induction: Intravenous  PONV Risk Score and Plan: 2 and Ondansetron, Dexamethasone and Treatment may vary due to age or medical condition  Airway Management Planned: LMA  Additional Equipment:   Intra-op Plan:   Post-operative Plan: Extubation in OR  Informed Consent: I have reviewed the patients History and Physical, chart, labs and discussed the procedure including the risks, benefits and alternatives for the proposed anesthesia with the patient or authorized representative who has indicated his/her understanding and acceptance.     Dental Advisory Given  Plan Discussed with: CRNA and Surgeon  Anesthesia Plan Comments: (Patient consented for risks of anesthesia including but not limited to:  - adverse reactions to medications - risk of airway placement if required - damage to eyes, teeth, lips or other oral mucosa - nerve damage due to positioning  - sore throat or hoarseness - Damage to heart, brain, nerves, lungs, other parts of body or loss of life  Patient voiced understanding.)        Anesthesia Quick Evaluation

## 2022-11-07 NOTE — Anesthesia Procedure Notes (Signed)
Procedure Name: LMA Insertion Date/Time: 11/07/2022 8:21 AM  Performed by: Carolynne Edouard, RNPre-anesthesia Checklist: Patient identified, Emergency Drugs available, Suction available, Patient being monitored and Timeout performed Patient Re-evaluated:Patient Re-evaluated prior to induction Oxygen Delivery Method: Circle system utilized Preoxygenation: Pre-oxygenation with 100% oxygen Induction Type: IV induction LMA: LMA inserted LMA Size: 5.0 Number of attempts: 1 Placement Confirmation: ETT inserted through vocal cords under direct vision, positive ETCO2 and breath sounds checked- equal and bilateral Tube secured with: Tape Dental Injury: Teeth and Oropharynx as per pre-operative assessment

## 2022-11-08 ENCOUNTER — Encounter: Payer: Self-pay | Admitting: Urology

## 2022-11-09 ENCOUNTER — Ambulatory Visit (INDEPENDENT_AMBULATORY_CARE_PROVIDER_SITE_OTHER): Payer: Medicare HMO | Admitting: Urology

## 2022-11-09 DIAGNOSIS — Z466 Encounter for fitting and adjustment of urinary device: Secondary | ICD-10-CM

## 2022-11-09 DIAGNOSIS — Z9889 Other specified postprocedural states: Secondary | ICD-10-CM

## 2022-11-09 LAB — SURGICAL PATHOLOGY

## 2022-11-09 NOTE — Progress Notes (Signed)
Catheter Removal  Patient is present today for a catheter removal.  10ml of water was drained from the balloon. A 18FR foley cath was removed from the bladder, no complications were noted. Patient tolerated well.  Performed by: Ples Specter CMA   Follow up/ Additional notes: As scheduled

## 2022-11-15 ENCOUNTER — Ambulatory Visit: Payer: Medicare HMO | Admitting: Urology

## 2022-11-15 VITALS — BP 113/73 | HR 73 | Ht 70.0 in | Wt 200.0 lb

## 2022-11-15 DIAGNOSIS — Z9079 Acquired absence of other genital organ(s): Secondary | ICD-10-CM

## 2022-11-15 DIAGNOSIS — R972 Elevated prostate specific antigen [PSA]: Secondary | ICD-10-CM

## 2022-11-15 DIAGNOSIS — N2889 Other specified disorders of kidney and ureter: Secondary | ICD-10-CM

## 2022-11-15 NOTE — Progress Notes (Signed)
I, Maysun Anabel Bene, acting as a scribe for Riki Altes, MD., have documented all relevant documentation on the behalf of Riki Altes, MD, as directed by Riki Altes, MD while in the presence of Riki Altes, MD.  11/15/2022 2:50 PM   Daniel Mason Nov 30, 1945 [id number]  Referring provider: Dorothey Baseman, MD [phone] [address] [place],  [place] [address]  Chief Complaint  Patient presents with   Follow-up   Urologic history: 1.  Bladder tumor/lesion  HPI: Daniel Mason is a 77 y.o. male presents for post-op follow-up.  Office cystoscopy recently performed for sterile pyuria remarkable for inflammatory chain dysprosthetic urethra and questionable bladder tumor.  Cystoscopy under anesthesia performed 11/07/2022 and there was no evidence of bladder tumor. There was an erythematous median lobe with inflammatory changes and inflammatory changes at the prostatic urethra/bladder neck region. He underwent transurethrasection of this abnormal appearing tissue and has no post-operative complaints.  Pathology: there was urethralia mucosa with ulceration and squamous metaplasia and focal keratinization. Focal atypical prostatic glands were also noted.   PMH: Past Medical History:  Diagnosis Date   Allergy    Arthritis    Bladder tumor    Chronic bilateral low back pain with bilateral sciatica    Chronic cough    DVT (deep venous thrombosis) (HCC) 04/2017   was on blood thinners until hematuria.  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   L lat abdomen, excised in [place] 04/30/19   Eosinophilic asthma    Gallbladder disease    GERD (gastroesophageal reflux disease)    History of actinic keratoses 08/31/2022   left ear and preauricular bx proven treated already   History of Barrett's esophagus    History of BPH    History of cataract    History of kidney stones    bladder stones and 2 small kidney stones   History of pulmonary embolus (PE)    Hypertension     Thrombocytopenia (HCC)    Tremor     Surgical History: Past Surgical History:  Procedure Laterality Date   CHOLECYSTECTOMY  2010   COLONOSCOPY WITH PROPOFOL N/A 12/02/2020   Procedure: COLONOSCOPY WITH PROPOFOL;  Surgeon: Jaynie Collins, DO;  Location: [facility] ENDOSCOPY;  Service: Gastroenterology;  Laterality: N/A;   CYSTOSCOPY WITH LITHOLAPAXY N/A 11/21/2018   Procedure: CYSTOSCOPY WITH LITHOLAPAXY;  Surgeon: Orson Ape, MD;  Location: [facility] ORS;  Service: Urology;  Laterality: N/A;   ESOPHAGOGASTRODUODENOSCOPY (EGD) WITH PROPOFOL N/A 12/02/2020   Procedure: ESOPHAGOGASTRODUODENOSCOPY (EGD) WITH PROPOFOL;  Surgeon: Jaynie Collins, DO;  Location: [facility] ENDOSCOPY;  Service: Gastroenterology;  Laterality: N/A;   EYE SURGERY Bilateral 2001   cataract extractions   GREEN LIGHT LASER TURP (TRANSURETHRAL RESECTION OF PROSTATE N/A 11/21/2018   Procedure: GREEN LIGHT LASER TURP (TRANSURETHRAL RESECTION OF PROSTATE;  Surgeon: Orson Ape, MD;  Location: [facility] ORS;  Service: Urology;  Laterality: N/A;   HERNIA REPAIR Left 2010   inguinal   NASAL SINUS SURGERY  1997, 2015   x 2   PROSTATE SURGERY  2001, 2011   .  2nd surgery was a turp   PROSTATOTOMY  11/21/2018   TOTAL HIP ARTHROPLASTY Left 2016   TRANSURETHRAL RESECTION OF PROSTATE  11/07/2022   Procedure: TRANSURETHRAL RESECTION OF THE PROSTATE (TURP);  Surgeon: Riki Altes, MD;  Location: [facility] ORS;  Service: Urology;;    Home Medications:  Allergies as of 11/15/2022       Reactions  Iodine Shortness Of Breath, Cough   Intravenous iodine.  No problem with betadine   Sulfa Antibiotics Hives, Swelling        Medication List        Accurate as of November 15, 2022  2:50 PM. If you have any questions, ask your nurse or doctor.          albuterol 108 (90 Base) MCG/ACT inhaler Commonly known as: VENTOLIN HFA Inhale 2 puffs into the lungs every 6 (six) hours as needed for wheezing or shortness of breath.    aspirin EC 81 MG tablet Take 81 mg by mouth daily. Swallow whole.   bifidobacterium infantis capsule Take 1 capsule by mouth daily.   doxylamine (Sleep) 25 MG tablet Commonly known as: UNISOM Take 25 mg by mouth at bedtime as needed.   levocetirizine 5 MG tablet Commonly known as: XYZAL Take 5 mg by mouth at bedtime.   mepolizumab 100 MG injection Commonly known as: NUCALA Inject 100 mg into the skin every 30 (thirty) days.   montelukast 10 MG tablet Commonly known as: SINGULAIR Take 10 mg by mouth at bedtime.   multivitamin with minerals Tabs tablet Take 1 tablet by mouth daily.   omeprazole 40 MG capsule Commonly known as: PRILOSEC Take 40 mg by mouth at bedtime.   tamsulosin 0.4 MG Caps capsule Commonly known as: FLOMAX Take 0.4 mg by mouth at bedtime.   traMADol 50 MG tablet Commonly known as: ULTRAM Take 1 tablet (50 mg total) by mouth every 6 (six) hours as needed for moderate pain.   valsartan 40 MG tablet Commonly known as: DIOVAN Take 40 mg by mouth every morning.        Allergies:  Allergies  Allergen Reactions   Iodine Shortness Of Breath and Cough    Intravenous iodine.  No problem with betadine   Sulfa Antibiotics Hives and Swelling    Social History:  reports that he has never smoked. He has never used smokeless tobacco. He reports current alcohol use. He reports that he does not use drugs.   Physical Exam: BP 113/73   Pulse 73   Ht 5\' 10"  (1.778 m)   Wt 200 lb (90.7 kg)   BMI 28.70 kg/m   Constitutional:  Alert and oriented, No acute distress. HEENT: [place] AT, moist mucus membranes.  Trachea midline, no masses. Cardiovascular: No clubbing, cyanosis, or edema. Respiratory: Normal respiratory effort, no increased work of breathing. GI: Abdomen is soft, nontender, nondistended, no abdominal masses Skin: No rashes, bruises or suspicious lesions. Neurologic: Grossly intact, no focal deficits, moving all 4 extremities. Psychiatric: Normal  mood and affect.   Assessment & Plan:    1. Status post TURP of avernolopurine prosthetic tissue Findings of inflammatory changes. Focal atypia was present. He is monitored for an elevated PSA and undergoes MRI without lesions suspicious for high-grade prostate cancer.  Have recommended a 6 month follow-up appointment with PSA.  2. Left renal masses. Follow-up MRI July 2024 showed unchanged bilateral renal lesions in the left upper and right lower poles, which were stable. 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 He desires to continue surveillance and will re-image July 2025.  [facility] [address] [place], [place] [address] [phone]

## 2022-11-16 ENCOUNTER — Encounter: Payer: Self-pay | Admitting: Urology

## 2022-11-18 NOTE — Anesthesia Postprocedure Evaluation (Signed)
Anesthesia Post Note  Patient: Daniel Mason  Procedure(s) Performed: TRANSURETHRAL RESECTION OF THE PROSTATE (TURP)  Patient location during evaluation: PACU Anesthesia Type: General Level of consciousness: awake and alert Pain management: pain level controlled Vital Signs Assessment: post-procedure vital signs reviewed and stable Respiratory status: spontaneous breathing, nonlabored ventilation, respiratory function stable and patient connected to nasal cannula oxygen Cardiovascular status: blood pressure returned to baseline and stable Postop Assessment: no apparent nausea or vomiting Anesthetic complications: no   No notable events documented.   Last Vitals:  Vitals:   11/07/22 0930 11/07/22 0945  BP: (!) 139/93 (!) 159/89  Pulse: 86 72  Resp: 16 16  Temp: (!) 36.1 C (!) 36.1 C  SpO2: 100% 97%    Last Pain:  Vitals:   11/07/22 0945  TempSrc: Temporal  PainSc: 0-No pain                 Lenard Simmer

## 2023-02-15 ENCOUNTER — Telehealth: Payer: Self-pay | Admitting: Urology

## 2023-02-15 MED ORDER — VARDENAFIL HCL 20 MG PO TABS: ORAL_TABLET | ORAL | 3 refills | Status: DC

## 2023-02-15 MED ORDER — TAMSULOSIN HCL 0.4 MG PO CAPS: 0.4000 mg | ORAL_CAPSULE | Freq: Every day | ORAL | 1 refills | Status: DC

## 2023-02-15 NOTE — Telephone Encounter (Signed)
 Pt stopped in to request refills for RX's Tamsulosin and Vardenafil. Pt needs these RX sent to [facility]. Please advice patient.

## 2023-03-07 ENCOUNTER — Ambulatory Visit: Payer: Medicare HMO | Admitting: Dermatology

## 2023-05-02 ENCOUNTER — Ambulatory Visit: Payer: Medicare HMO | Admitting: Dermatology

## 2023-05-09 ENCOUNTER — Other Ambulatory Visit: Payer: Self-pay

## 2023-05-09 DIAGNOSIS — R972 Elevated prostate specific antigen [PSA]: Secondary | ICD-10-CM

## 2023-05-10 LAB — PSA: Prostate Specific Ag, Serum: 6.1 ng/mL — ABNORMAL HIGH (ref 0.0–4.0)

## 2023-05-16 ENCOUNTER — Encounter: Payer: Self-pay | Admitting: Urology

## 2023-05-16 ENCOUNTER — Ambulatory Visit: Payer: Self-pay | Admitting: Urology

## 2023-05-16 VITALS — BP 133/82 | HR 87 | Ht 70.0 in | Wt 202.0 lb

## 2023-05-16 DIAGNOSIS — N401 Enlarged prostate with lower urinary tract symptoms: Secondary | ICD-10-CM

## 2023-05-16 DIAGNOSIS — N2889 Other specified disorders of kidney and ureter: Secondary | ICD-10-CM | POA: Diagnosis not present

## 2023-05-16 DIAGNOSIS — R972 Elevated prostate specific antigen [PSA]: Secondary | ICD-10-CM | POA: Diagnosis not present

## 2023-05-16 NOTE — Progress Notes (Signed)
 I, Daniel Mason, acting as a scribe for Daniel Knapp, MD., have documented all relevant documentation on Daniel behalf of Daniel Knapp, MD, as directed by Daniel Knapp, MD while in Daniel presence of Daniel Knapp, MD.  05/16/2023 9:59 PM   Carleton Cheek 1945-06-22 [id number]  Referring provider: Rory Collard, MD [phone] [place],  [place] [address]  Chief Complaint  Patient presents with   Elevated PSA   Urologic history. 1. BPH with LUTS Greenlight PVP/cystolitholapaxy Dr Fredrick Jenkins 11/2018.  Tamsulosin 0.4 mg daily  2. Elevated PSA PSA 2023 6.8; prostate MRI with 84 cc gland no high-grade lesions, with PIRADS 3 lesions, no high-grade lesions. Prior MRI showed PIRADS 3 lesions x2 for which he elected surveillance.   3. Renal mass Small enhancing bilateral renal lesions stable since 2022.   4. Erectile dysfunction On vardenafil Intracavernosal injections in Daniel past.  5. History sterile pyuria. 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 male presents for a 6 month follow-up.  No significant changes since last visit.  PSA 05/09/23 stable 6.1 No gross hematuria.    PMH: Past Medical History:  Diagnosis Date   Allergy    Arthritis    Bladder tumor    Chronic bilateral low back pain with bilateral sciatica    Chronic cough    DVT (deep venous thrombosis) (HCC) 04/2017   was on blood thinners until hematuria.  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   L lat abdomen, excised in [place]  04/30/19   Eosinophilic asthma    Gallbladder disease    GERD (gastroesophageal reflux disease)    History of actinic keratoses 08/31/2022   left ear and preauricular bx proven treated already   History of  Barrett's esophagus    History of BPH    History of cataract    History of kidney stones    bladder stones and 2 small kidney stones   History of pulmonary embolus (PE)    Hypertension    Thrombocytopenia (HCC)    Tremor     Surgical History: Past Surgical History:  Procedure Laterality Date   CHOLECYSTECTOMY  2010   COLONOSCOPY WITH PROPOFOL N/A 12/02/2020   Procedure: COLONOSCOPY WITH PROPOFOL;  Surgeon: Quintin Buckle, DO;  Location: [facility] ENDOSCOPY;  Service: Gastroenterology;  Laterality: N/A;   CYSTOSCOPY WITH LITHOLAPAXY N/A 11/21/2018   Procedure: CYSTOSCOPY WITH LITHOLAPAXY;  Surgeon: Rea Cambridge, MD;  Location: [facility] ORS;  Service: Urology;  Laterality: N/A;   ESOPHAGOGASTRODUODENOSCOPY (EGD) WITH PROPOFOL N/A 12/02/2020   Procedure: ESOPHAGOGASTRODUODENOSCOPY (EGD) WITH PROPOFOL;  Surgeon: Quintin Buckle, DO;  Location: [facility] ENDOSCOPY;  Service: Gastroenterology;  Laterality: N/A;   EYE SURGERY Bilateral 2001   cataract extractions   GREEN LIGHT LASER TURP (TRANSURETHRAL RESECTION OF PROSTATE N/A 11/21/2018   Procedure: GREEN LIGHT LASER TURP (TRANSURETHRAL RESECTION OF PROSTATE;  Surgeon: Rea Cambridge, MD;  Location: [facility] ORS;  Service: Urology;  Laterality: N/A;   HERNIA REPAIR Left 2010   inguinal   NASAL SINUS SURGERY  1997, 2015   x 2   PROSTATE SURGERY  2001, 2011   .  2nd surgery was a turp   PROSTATOTOMY  11/21/2018   TOTAL HIP ARTHROPLASTY Left 2016   TRANSURETHRAL RESECTION OF PROSTATE  11/07/2022   Procedure: TRANSURETHRAL RESECTION OF Daniel PROSTATE (TURP);  Surgeon: Daniel Knapp, MD;  Location: [facility] ORS;  Service: Urology;;    Home Medications:  Allergies as of 05/16/2023       Reactions   Iodine Shortness Of Breath, Cough   Intravenous iodine.  No problem with betadine   Sulfa Antibiotics Hives, Swelling        Medication List        Accurate as of May 16, 2023  9:59 PM. If you have any questions, ask your nurse or  doctor.          STOP taking these medications    traMADol 50 MG tablet Commonly known as: ULTRAM Stopped by: Daniel Mason       TAKE these medications    albuterol 108 (90 Base) MCG/ACT inhaler Commonly known as: VENTOLIN HFA Inhale 2 puffs into Daniel lungs every 6 (six) hours as needed for wheezing or shortness of breath.   aspirin EC 81 MG tablet Take 81 mg by mouth daily. Swallow whole.   bifidobacterium infantis capsule Take 1 capsule by mouth daily.   doxylamine (Sleep) 25 MG tablet Commonly known as: UNISOM Take 25 mg by mouth at bedtime as needed.   levocetirizine 5 MG tablet Commonly known as: XYZAL Take 5 mg by mouth at bedtime.   mepolizumab 100 MG injection Commonly known as: NUCALA Inject 100 mg into Daniel skin every 30 (thirty) days.   montelukast 10 MG tablet Commonly known as: SINGULAIR Take 10 mg by mouth at bedtime.   multivitamin with minerals Tabs tablet Take 1 tablet by mouth daily.   omeprazole 40 MG capsule Commonly known as: PRILOSEC Take 40 mg by mouth at bedtime.   tamsulosin 0.4 MG Caps capsule Commonly known as: FLOMAX Take 1 capsule (0.4 mg total) by mouth at bedtime.   valsartan 40 MG tablet Commonly known as: DIOVAN Take 40 mg by mouth every morning.   vardenafil 20 MG tablet Commonly known as: LEVITRA 1 tablet 1 hour prior to intercourse as needed        Allergies:  Allergies  Allergen Reactions   Iodine Shortness Of Breath and Cough    Intravenous iodine.  No problem with betadine   Sulfa Antibiotics Hives and Swelling    Social History:  reports that he has never smoked. He has never used smokeless tobacco. He reports current alcohol use. He reports that he does not use drugs.   Physical Exam: BP 133/82   Pulse 87   Ht 5\' 10"  (1.778 m)   Wt 202 lb (91.6 kg)   BMI 28.98 kg/m   Constitutional:  Alert and oriented, No acute distress. HEENT: [place] AT Respiratory: Normal respiratory effort, no increased  work of breathing. GI: Abdomen is soft, nontender, nondistended, no abdominal masses Psychiatric: Normal mood and affect.  Assessment & Plan:    1. Elevated PSA Stable, he desires to continue surveillance.  Follow-up 6 month PSA.   2. Renal masses Yearly MRI and will schedule in approximately 6 months.  3. BPH with LUTS Stable; tamsulosin refilled.  I have reviewed Daniel above documentation for accuracy and completeness, and I agree with Daniel above.   Daniel Knapp, MD  [facility] [address] [place], [place] [address] [phone]

## 2023-06-11 ENCOUNTER — Ambulatory Visit: Admitting: Dermatology

## 2023-06-11 DIAGNOSIS — L711 Rhinophyma: Secondary | ICD-10-CM

## 2023-06-11 DIAGNOSIS — L821 Other seborrheic keratosis: Secondary | ICD-10-CM

## 2023-06-11 DIAGNOSIS — L570 Actinic keratosis: Secondary | ICD-10-CM

## 2023-06-11 DIAGNOSIS — Z7189 Other specified counseling: Secondary | ICD-10-CM

## 2023-06-11 DIAGNOSIS — D1801 Hemangioma of skin and subcutaneous tissue: Secondary | ICD-10-CM

## 2023-06-11 DIAGNOSIS — D492 Neoplasm of unspecified behavior of bone, soft tissue, and skin: Secondary | ICD-10-CM | POA: Diagnosis not present

## 2023-06-11 DIAGNOSIS — L719 Rosacea, unspecified: Secondary | ICD-10-CM | POA: Diagnosis not present

## 2023-06-11 DIAGNOSIS — W908XXA Exposure to other nonionizing radiation, initial encounter: Secondary | ICD-10-CM

## 2023-06-11 DIAGNOSIS — D489 Neoplasm of uncertain behavior, unspecified: Secondary | ICD-10-CM

## 2023-06-11 DIAGNOSIS — L814 Other melanin hyperpigmentation: Secondary | ICD-10-CM

## 2023-06-11 DIAGNOSIS — Z1283 Encounter for screening for malignant neoplasm of skin: Secondary | ICD-10-CM

## 2023-06-11 DIAGNOSIS — D229 Melanocytic nevi, unspecified: Secondary | ICD-10-CM

## 2023-06-11 DIAGNOSIS — L578 Other skin changes due to chronic exposure to nonionizing radiation: Secondary | ICD-10-CM

## 2023-06-11 DIAGNOSIS — D692 Other nonthrombocytopenic purpura: Secondary | ICD-10-CM

## 2023-06-11 DIAGNOSIS — D225 Melanocytic nevi of trunk: Secondary | ICD-10-CM

## 2023-06-11 DIAGNOSIS — Z86018 Personal history of other benign neoplasm: Secondary | ICD-10-CM

## 2023-06-11 NOTE — Progress Notes (Unsigned)
 Follow-Up Visit   Subjective  Daniel Mason is a 78 y.o. male who presents for the following: Skin Cancer Screening and Full Body Skin Exam Hx of aks used 5 f/u cream to scalp in July and got very red and inflamed  Hx of dysplastic nevi  The patient presents for Total-Body Skin Exam (TBSE) for skin cancer screening and mole check. The patient has spots, moles and lesions to be evaluated, some may be new or changing and the patient may have concern these could be cancer.  The following portions of the chart were reviewed this encounter and updated as appropriate: medications, allergies, medical history  Review of Systems:  No other skin or systemic complaints except as noted in HPI or Assessment and Plan.  Objective  Well appearing patient in no apparent distress; mood and affect are within normal limits.  A full examination was performed including scalp, head, eyes, ears, nose, lips, neck, chest, axillae, abdomen, back, buttocks, bilateral upper extremities, bilateral lower extremities, hands, feet, fingers, toes, fingernails, and toenails. All findings within normal limits unless otherwise noted below.   Relevant physical exam findings are noted in the Assessment and Plan.  scalp x 3, right ear x 1 (4) Erythematous thin papules/macules with gritty scale.  mid to upper back spinal 0.6 cm dark brown macule   mid to low back spinal 0.6 cm dark brown macule    Assessment & Plan   SKIN CANCER SCREENING PERFORMED TODAY.  ACTINIC DAMAGE - Chronic condition, secondary to cumulative UV/sun exposure - diffuse scaly erythematous macules with underlying dyspigmentation - Recommend daily broad spectrum sunscreen SPF 30+ to sun-exposed areas, reapply every 2 hours as needed.  - Staying in the shade or wearing long sleeves, sun glasses (UVA+UVB protection) and wide brim hats (4-inch brim around the entire circumference of the hat) are also recommended for sun protection.  - Call for new or  changing lesions.  LENTIGINES, SEBORRHEIC KERATOSES, HEMANGIOMAS - Benign normal skin lesions - Benign-appearing - Call for any changes  MELANOCYTIC NEVI - Tan-brown and/or pink-flesh-colored symmetric macules and papules - Benign appearing on exam today - Observation - Call clinic for new or changing moles - Recommend daily use of broad spectrum spf 30+ sunscreen to sun-exposed areas.   Purpura - Chronic; persistent and recurrent.  Treatable, but not curable. 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 Condition is symptomatic/ bothersome to patient. Not currently at goal. Rosacea is a chronic progressive skin condition usually affecting the face of adults, causing redness and/or acne bumps. It is treatable but not curable. It sometimes affects the eyes (ocular rosacea) as well. It may respond to topical and/or systemic medication and can flare with stress, sun exposure, alcohol, exercise, topical steroids (including hydrocortisone/cortisone 10) and some foods.  Daily application of broad spectrum spf 30+ sunscreen to face is recommended to reduce flares.  Patient denies grittiness of the eyes   Treatment Plan Mild  Patient declined treatment   History of Dysplastic Nevi 03/18/2019 left lateral abdomen, excised in [place]  04/30/2019 - No evidence of recurrence today - Recommend regular full body  skin exams - Recommend daily broad spectrum sunscreen SPF 30+ to sun-exposed areas, reapply every 2 hours as needed.  - Call if any new or changing lesions are noted between office visits   ACTINIC KERATOSIS (4) scalp x 3, right ear x 1 (4) Actinic keratoses are precancerous spots that appear secondary to cumulative UV radiation exposure/sun exposure over time. They are chronic with expected duration over 1 year. A portion of actinic keratoses will progress to squamous cell carcinoma of the skin. It is not possible to reliably predict which spots will progress to skin cancer and so treatment is recommended to prevent development of skin cancer.  Recommend daily broad spectrum sunscreen SPF 30+ to sun-exposed areas, reapply every 2 hours as needed.  Recommend staying in the shade or wearing long sleeves, sun glasses (UVA+UVB protection) and wide brim hats (4-inch brim around the entire circumference of the hat). Call for new or changing lesions. Destruction of lesion - scalp x 3, right ear x 1 (4) Complexity: simple   Destruction method: cryotherapy   Informed consent: discussed and consent obtained   Timeout:  patient name, date of birth, surgical site, and procedure verified Lesion destroyed using liquid nitrogen: Yes   Region frozen until ice ball extended beyond lesion: Yes   Outcome: patient tolerated procedure well with no complications   Post-procedure details: wound care instructions given   NEOPLASM OF UNCERTAIN BEHAVIOR (2) mid to upper back spinal Epidermal / dermal shaving  Lesion diameter (cm):  0.6 Informed consent: discussed and consent obtained   Timeout: patient name, date of birth, surgical site, and procedure verified   Procedure prep:  Patient was prepped and draped in usual sterile fashion Prep type:  Isopropyl alcohol Anesthesia: the lesion was anesthetized in a standard fashion   Anesthetic:  1% lidocaine  w/ epinephrine 1-100,000 buffered w/ 8.4% NaHCO3 Instrument used: flexible razor blade   Hemostasis achieved with: pressure, aluminum chloride and electrodesiccation   Outcome: patient tolerated procedure well   Post-procedure details: sterile  dressing applied and wound care instructions given   Dressing type: bandage and petrolatum   Specimen 1 - Surgical pathology Differential Diagnosis: nevus r/o dysplasia   Check Margins: yes mid to low back spinal Epidermal / dermal shaving  Lesion diameter (cm):  0.6 Informed consent: discussed and consent obtained   Timeout: patient name, date of birth, surgical site, and procedure verified   Procedure prep:  Patient was prepped and draped in usual sterile fashion Prep type:  Isopropyl alcohol Anesthesia: the lesion was anesthetized in a standard fashion   Anesthetic:  1% lidocaine  w/ epinephrine 1-100,000 buffered w/ 8.4% NaHCO3 Instrument used: flexible razor blade   Hemostasis achieved with: pressure, aluminum chloride and electrodesiccation   Outcome: patient tolerated procedure well   Post-procedure details: sterile dressing applied and wound care instructions given   Dressing type: bandage and petrolatum   Specimen 2 - Surgical pathology Differential Diagnosis: nevus r/o dysplasia   Check Margins: yes Nevus r/o dysplasia  ROSACEA   COUNSELING AND COORDINATION OF CARE   LENTIGO   MELANOCYTIC NEVUS, UNSPECIFIED LOCATION   SKIN CANCER SCREENING   HISTORY OF DYSPLASTIC NEVUS   ACTINIC SKIN DAMAGE   PURPURA (HCC)   Return in about 1 year (around 06/10/2024) for TBSE.  IRandee Busing, CMA, am acting as scribe for Celine Collard, MD.   Documentation: I have reviewed the above documentation for accuracy and completeness, and I agree with the above.  Celine Collard, MD

## 2023-06-11 NOTE — Patient Instructions (Addendum)
Biopsy Wound Care Instructions  Leave the original bandage on for 24 hours if possible.  If the bandage becomes soaked or soiled before that time, it is OK to remove it and examine the wound.  A small amount of post-operative bleeding is normal.  If excessive bleeding occurs, remove the bandage, place gauze over the site and apply continuous pressure (no peeking) over the area for 30 minutes. If this does not work, please call our clinic as soon as possible or page your doctor if it is after hours.   Once a day, cleanse the wound with soap and water. It is fine to shower. If a thick crust develops you may use a Q-tip dipped into dilute hydrogen peroxide (mix 1:1 with water) to dissolve it.  Hydrogen peroxide can slow the healing process, so use it only as needed.    After washing, apply petroleum jelly (Vaseline) or an antibiotic ointment if your doctor prescribed one for you, followed by a bandage.    For best healing, the wound should be covered with a layer of ointment at all times. If you are not able to keep the area covered with a bandage to hold the ointment in place, this may mean re-applying the ointment several times a day.  Continue this wound care until the wound has healed and is no longer open.   Itching and mild discomfort is normal during the healing process. However, if you develop pain or severe itching, please call our office.   If you have any discomfort, you can take Tylenol (acetaminophen) or ibuprofen as directed on the bottle. (Please do not take these if you have an allergy to them or cannot take them for another reason).  Some redness, tenderness and white or yellow material in the wound is normal healing.  If the area becomes very sore and red, or develops a thick yellow-green material (pus), it may be infected; please notify us.    If you have stitches, return to clinic as directed to have the stitches removed. You will continue wound care for 2-3 days after the stitches  are removed.   Wound healing continues for up to one year following surgery. It is not unusual to experience pain in the scar from time to time during the interval.  If the pain becomes severe or the scar thickens, you should notify the office.    A slight amount of redness in a scar is expected for the first six months.  After six months, the redness will fade and the scar will soften and fade.  The color difference becomes less noticeable with time.  If there are any problems, return for a post-op surgery check at your earliest convenience.  To improve the appearance of the scar, you can use silicone scar gel, cream, or sheets (such as Mederma or Serica) every night for up to one year. These are available over the counter (without a prescription).  Please call our office at [phone] for any questions or concerns.       Actinic keratoses are precancerous spots that appear secondary to cumulative UV radiation exposure/sun exposure over time. They are chronic with expected duration over 1 year. A portion of actinic keratoses will progress to squamous cell carcinoma of the skin. It is not possible to reliably predict which spots will progress to skin cancer and so treatment is recommended to prevent development of skin cancer.  Recommend daily broad spectrum sunscreen SPF 30+ to sun-exposed areas, reapply every 2 hours  as needed.  Recommend staying in the shade or wearing long sleeves, sun glasses (UVA+UVB protection) and wide brim hats (4-inch brim around the entire circumference of the hat). Call for new or changing lesions.    Cryotherapy Aftercare  Wash gently with soap and water everyday.   Apply Vaseline and Band-Aid daily until healed.     Melanoma ABCDEs  Melanoma is the most dangerous type of skin cancer, and is the leading cause of death from skin disease.  You are more likely to develop melanoma if you: Have light-colored skin, light-colored eyes, or red or blond  hair Spend a lot of time in the sun Tan regularly, either outdoors or in a tanning bed Have had blistering sunburns, especially during childhood Have a close family member who has had a melanoma Have atypical moles or large birthmarks  Early detection of melanoma is key since treatment is typically straightforward and cure rates are extremely high if we catch it early.   The first sign of melanoma is often a change in a mole or a new dark spot.  The ABCDE system is a way of remembering the signs of melanoma.  A for asymmetry:  The two halves do not match. B for border:  The edges of the growth are irregular. C for color:  A mixture of colors are present instead of an even brown color. D for diameter:  Melanomas are usually (but not always) greater than 6mm - the size of a pencil eraser. E for evolution:  The spot keeps changing in size, shape, and color.  Please check your skin once per month between visits. You can use a small mirror in front and a large mirror behind you to keep an eye on the back side or your body.   If you see any new or changing lesions before your next follow-up, please call to schedule a visit.  Please continue daily skin protection including broad spectrum sunscreen SPF 30+ to sun-exposed areas, reapplying every 2 hours as needed when you're outdoors.   Staying in the shade or wearing long sleeves, sun glasses (UVA+UVB protection) and wide brim hats (4-inch brim around the entire circumference of the hat) are also recommended for sun protection.    Due to recent changes in healthcare laws, you may see results of your pathology and/or laboratory studies on MyChart before the doctors have had a chance to review them. We understand that in some cases there may be results that are confusing or concerning to you. Please understand that not all results are received at the same time and often the doctors may need to interpret multiple results in order to provide you with  the best plan of care or course of treatment. Therefore, we ask that you please give [place] 2 business days to thoroughly review all your results before contacting the office for clarification. Should we see a critical lab result, you will be contacted sooner.   If You Need Anything After Your Visit  If you have any questions or concerns for your doctor, please call our main line at [phone] and press option 4 to reach your doctor's medical assistant. If no one answers, please leave a voicemail as directed and we will return your call as soon as possible. Messages left after 4 pm will be answered the following business day.   You may also send [place] a message via MyChart. We typically respond to MyChart messages within 1-2 business days.  For prescription refills, please ask  your pharmacy to contact our office. Our fax number is [phone].  If you have an urgent issue when the clinic is closed that cannot wait until the next business day, you can page your doctor at the number below.    Please note that while we do our best to be available for urgent issues outside of office hours, we are not available 24/7.   If you have an urgent issue and are unable to reach [place], you may choose to seek medical care at your doctor's office, retail clinic, urgent care center, or emergency room.  If you have a medical emergency, please immediately call 911 or go to the emergency department.  Pager Numbers  - Dr. Gwen Pounds: [phone]  - Dr. Roseanne Reno: [phone]  - Dr. Katrinka Blazing: [phone]   In the event of inclement weather, please call our main line at [phone] for an update on the status of any delays or closures.  Dermatology Medication Tips: Please keep the boxes that topical medications come in in order to help keep track of the instructions about where and how to use these. Pharmacies typically print the medication instructions only on the boxes and not directly on the medication tubes.   If  your medication is too expensive, please contact our office at [phone] option 4 or send [place] a message through MyChart.   We are unable to tell what your co-pay for medications will be in advance as this is different depending on your insurance coverage. However, we may be able to find a substitute medication at lower cost or fill out paperwork to get insurance to cover a needed medication.   If a prior authorization is required to get your medication covered by your insurance company, please allow [place] 1-2 business days to complete this process.  Drug prices often vary depending on where the prescription is filled and some pharmacies may offer cheaper prices.  The website www.goodrx.com contains coupons for medications through different pharmacies. The prices here do not account for what the cost may be with help from insurance (it may be cheaper with your insurance), but the website can give you the price if you did not use any insurance.  - You can print the associated coupon and take it with your prescription to the pharmacy.  - You may also stop by our office during regular business hours and pick up a GoodRx coupon card.  - If you need your prescription sent electronically to a different pharmacy, notify our office through [facility] or by phone at [phone] option 4.     Si Usted Necesita Algo Despus de Su Visita  Tambin puede enviarnos un mensaje a travs de Clinical cytogeneticist. Por lo general respondemos a los mensajes de MyChart en el transcurso de 1 a 2 das hbiles.  Para renovar recetas, por favor pida a su farmacia que se ponga en contacto con nuestra oficina. Annie Sable de fax es [place] [phone].  Si tiene un asunto urgente cuando la clnica est cerrada y que no puede esperar hasta el siguiente da hbil, puede llamar/localizar a su doctor(a) al nmero que aparece a continuacin.   Por favor, tenga en cuenta que aunque hacemos todo lo posible para estar disponibles para  asuntos urgentes fuera del horario de [place], no estamos disponibles las 24 horas del da, los 7 [address]  [address] de la [place].   Si tiene un problema urgente y no puede comunicarse con nosotros, puede optar por buscar atencin Sports administrator de su  doctor(a), en una clnica privada, en un centro de atencin urgente o en una sala de emergencias.  Si tiene Engineer, drilling, por favor llame inmediatamente al 911 o vaya a la sala de emergencias.  Nmeros de bper  - Dr. Gwen Pounds: [phone]  - Dra. Roseanne Reno: [phone]  - Dr. Katrinka Blazing: [phone]   En caso de inclemencias del tiempo, por favor llame a Lacy Duverney principal al [phone] para una actualizacin sobre el [place] de cualquier retraso o cierre.  Consejos para la medicacin en dermatologa: Por favor, guarde las cajas en las que vienen los medicamentos de uso tpico para ayudarle a seguir las instrucciones sobre dnde y cmo usarlos. Las farmacias generalmente imprimen las instrucciones del medicamento slo en las cajas y no directamente en los tubos del [place].   Si su medicamento es muy caro, por favor, pngase en contacto con Rolm Gala llamando al [phone] y presione la opcin 4 o envenos un mensaje a travs de Clinical cytogeneticist.   No podemos decirle cul ser su copago por los medicamentos por adelantado ya que esto es diferente dependiendo de la cobertura de su seguro. Sin embargo, es posible que podamos encontrar un medicamento sustituto a Audiological scientist un formulario para que el seguro cubra el medicamento que se considera necesario.   Si se requiere una autorizacin previa para que su compaa de seguros [place] su medicamento, por favor permtanos de 1 a 2 das hbiles para completar [address]  Los precios de los medicamentos varan con frecuencia dependiendo del Environmental consultant de dnde se surte la receta y alguna farmacias pueden ofrecer precios ms baratos.  El sitio web www.goodrx.com tiene cupones para  medicamentos de Health and safety inspector. Los precios aqu no tienen en cuenta lo que podra costar con la ayuda del seguro (puede ser ms barato con su seguro), pero el sitio web puede darle el precio si no utiliz Tourist information centre manager.  - Puede imprimir el cupn correspondiente y llevarlo con su receta a la farmacia.  - Tambin puede pasar por nuestra oficina durante el horario de atencin regular y Education officer, museum una tarjeta de cupones de GoodRx.  - Si necesita que su receta se enve electrnicamente a una farmacia diferente, informe a nuestra oficina a travs de MyChart de [place] o por telfono llamando al [phone] y presione la opcin 4.

## 2023-06-14 ENCOUNTER — Encounter: Payer: Self-pay | Admitting: Dermatology

## 2023-06-15 LAB — SURGICAL PATHOLOGY

## 2023-06-18 ENCOUNTER — Encounter: Payer: Self-pay | Admitting: Dermatology

## 2023-06-19 ENCOUNTER — Ambulatory Visit: Payer: Self-pay

## 2023-06-19 NOTE — Telephone Encounter (Signed)
Patient informed of pathology results and appointment scheduled.  °

## 2023-06-19 NOTE — Telephone Encounter (Signed)
-----   Message from Celine Collard sent at 06/18/2023  5:14 PM EDT ----- FINAL DIAGNOSIS        1. Skin, mid to upper back spinal :       DYSPLASTIC JUNCTIONAL NEVUS WITH SEVERE ATYPIA, CLOSE TO MARGIN, SEE DESCRIPTION        2. Skin, mid to low back spinal :       DYSPLASTIC JUNCTIONAL NEVUS WITH MODERATE ATYPIA, CLOSE TO MARGIN   1- Severe dysplastic Margins clear, but "close to margin" May need additional procedure Recheck 6 mos.  MAKE PT APPT FOR 6 MOS 2- Moderate Dysplastic Recheck next visit

## 2023-07-25 DIAGNOSIS — J4540 Moderate persistent asthma, uncomplicated: Secondary | ICD-10-CM | POA: Diagnosis present

## 2023-08-01 ENCOUNTER — Ambulatory Visit: Payer: Self-pay | Admitting: Urology

## 2023-08-17 ENCOUNTER — Other Ambulatory Visit: Payer: Self-pay | Admitting: Urology

## 2023-08-31 ENCOUNTER — Encounter: Payer: Self-pay | Admitting: Urology

## 2023-10-20 ENCOUNTER — Emergency Department

## 2023-10-20 ENCOUNTER — Other Ambulatory Visit: Payer: Self-pay

## 2023-10-20 ENCOUNTER — Inpatient Hospital Stay
Admission: EM | Admit: 2023-10-20 | Discharge: 2023-10-25 | DRG: 164 | Disposition: A | Discharge status: Home / Self Care | Attending: Internal Medicine | Admitting: Internal Medicine

## 2023-10-20 DIAGNOSIS — Z86711 Personal history of pulmonary embolism: Secondary | ICD-10-CM

## 2023-10-20 DIAGNOSIS — J4540 Moderate persistent asthma, uncomplicated: Secondary | ICD-10-CM | POA: Diagnosis present

## 2023-10-20 DIAGNOSIS — N400 Enlarged prostate without lower urinary tract symptoms: Secondary | ICD-10-CM | POA: Diagnosis present

## 2023-10-20 DIAGNOSIS — Z79899 Other long term (current) drug therapy: Secondary | ICD-10-CM | POA: Diagnosis not present

## 2023-10-20 DIAGNOSIS — Z7982 Long term (current) use of aspirin: Secondary | ICD-10-CM

## 2023-10-20 DIAGNOSIS — Z8719 Personal history of other diseases of the digestive system: Secondary | ICD-10-CM

## 2023-10-20 DIAGNOSIS — Z9079 Acquired absence of other genital organ(s): Secondary | ICD-10-CM | POA: Diagnosis not present

## 2023-10-20 DIAGNOSIS — I82452 Acute embolism and thrombosis of left peroneal vein: Secondary | ICD-10-CM | POA: Diagnosis present

## 2023-10-20 DIAGNOSIS — Z91041 Radiographic dye allergy status: Secondary | ICD-10-CM

## 2023-10-20 DIAGNOSIS — Z9889 Other specified postprocedural states: Secondary | ICD-10-CM | POA: Diagnosis not present

## 2023-10-20 DIAGNOSIS — N401 Enlarged prostate with lower urinary tract symptoms: Secondary | ICD-10-CM | POA: Diagnosis not present

## 2023-10-20 DIAGNOSIS — I2699 Other pulmonary embolism without acute cor pulmonale: Secondary | ICD-10-CM

## 2023-10-20 DIAGNOSIS — I10 Essential (primary) hypertension: Secondary | ICD-10-CM | POA: Diagnosis present

## 2023-10-20 DIAGNOSIS — Z882 Allergy status to sulfonamides status: Secondary | ICD-10-CM

## 2023-10-20 DIAGNOSIS — Z9049 Acquired absence of other specified parts of digestive tract: Secondary | ICD-10-CM

## 2023-10-20 DIAGNOSIS — K2270 Barrett's esophagus without dysplasia: Secondary | ICD-10-CM | POA: Diagnosis present

## 2023-10-20 DIAGNOSIS — I82432 Acute embolism and thrombosis of left popliteal vein: Secondary | ICD-10-CM | POA: Diagnosis present

## 2023-10-20 DIAGNOSIS — R0602 Shortness of breath: Secondary | ICD-10-CM | POA: Diagnosis present

## 2023-10-20 DIAGNOSIS — Z96642 Presence of left artificial hip joint: Secondary | ICD-10-CM | POA: Diagnosis present

## 2023-10-20 DIAGNOSIS — I82442 Acute embolism and thrombosis of left tibial vein: Secondary | ICD-10-CM | POA: Diagnosis present

## 2023-10-20 LAB — BASIC METABOLIC PANEL WITH GFR
Anion gap: 13 (ref 5–15)
BUN: 15 mg/dL (ref 8–23)
CO2: 19 mmol/L — ABNORMAL LOW (ref 22–32)
Calcium: 8.7 mg/dL — ABNORMAL LOW (ref 8.9–10.3)
Chloride: 108 mmol/L (ref 98–111)
Creatinine, Ser: 1.19 mg/dL (ref 0.61–1.24)
GFR, Estimated: >60 mL/min (ref >60)
Glucose, Bld: 86 mg/dL (ref 70–99)
Potassium: 3.7 mmol/L (ref 3.5–5.1)
Sodium: 140 mmol/L (ref 135–145)

## 2023-10-20 LAB — TROPONIN I (HIGH SENSITIVITY)
Troponin I (High Sensitivity): 27 ng/L — ABNORMAL HIGH (ref <18)
Troponin I (High Sensitivity): 32 ng/L — ABNORMAL HIGH (ref <18)

## 2023-10-20 LAB — CBC
HCT: 46.8 % (ref 39.0–52.0)
Hemoglobin: 16.1 g/dL (ref 13.0–17.0)
MCH: 29.9 pg (ref 26.0–34.0)
MCHC: 34.4 g/dL (ref 30.0–36.0)
MCV: 87 fL (ref 80.0–100.0)
Platelets: 159 K/uL (ref 150–400)
RBC: 5.38 MIL/uL (ref 4.22–5.81)
RDW: 13 % (ref 11.5–15.5)
WBC: 11.2 K/uL — ABNORMAL HIGH (ref 4.0–10.5)
nRBC: 0 % (ref 0.0–0.2)

## 2023-10-20 LAB — BRAIN NATRIURETIC PEPTIDE: B Natriuretic Peptide: 131 pg/mL — ABNORMAL HIGH (ref 0.0–100.0)

## 2023-10-20 LAB — APTT: aPTT: 34 s (ref 24–36)

## 2023-10-20 LAB — PROTIME-INR
INR: 1 (ref 0.8–1.2)
Prothrombin Time: 14 s (ref 11.4–15.2)

## 2023-10-20 MED ORDER — HEPARIN (PORCINE) 25000 UT/250ML-% IV SOLN
1300.0000 [IU]/h | INTRAVENOUS | Status: AC
  Administered 2023-10-20: 1500 [IU]/h via INTRAVENOUS
  Administered 2023-10-22 – 2023-10-24 (×4): 1300 [IU]/h via INTRAVENOUS
  Filled 2023-10-20 (×6): qty 250

## 2023-10-20 MED ORDER — IOHEXOL 350 MG/ML SOLN
75.0000 mL | Freq: Once | INTRAVENOUS | Status: AC | PRN
  Administered 2023-10-20: 75 mL via INTRAVENOUS

## 2023-10-20 MED ORDER — ACETAMINOPHEN 650 MG RE SUPP: 650.0000 mg | Freq: Four times a day (QID) | RECTAL | Status: DC | PRN

## 2023-10-20 MED ORDER — ONDANSETRON HCL 4 MG PO TABS: 4.0000 mg | ORAL_TABLET | Freq: Four times a day (QID) | ORAL | Status: DC | PRN

## 2023-10-20 MED ORDER — ONDANSETRON HCL 4 MG/2ML IJ SOLN: 4.0000 mg | Freq: Four times a day (QID) | INTRAMUSCULAR | Status: DC | PRN

## 2023-10-20 MED ORDER — SODIUM CHLORIDE 0.9% FLUSH
3.0000 mL | Freq: Two times a day (BID) | INTRAVENOUS | Status: DC
  Administered 2023-10-20 – 2023-10-24 (×7): 3 mL via INTRAVENOUS
  Administered 2023-10-24: 20 mL via INTRAVENOUS
  Administered 2023-10-25: 3 mL via INTRAVENOUS

## 2023-10-20 MED ORDER — ACETAMINOPHEN 325 MG PO TABS
650.0000 mg | ORAL_TABLET | Freq: Four times a day (QID) | ORAL | Status: DC | PRN
  Filled 2023-10-20: qty 2

## 2023-10-20 MED ORDER — HYDROMORPHONE HCL 1 MG/ML IJ SOLN: 0.5000 mg | INTRAMUSCULAR | Status: DC | PRN

## 2023-10-20 MED ORDER — HEPARIN BOLUS VIA INFUSION
6400.0000 [IU] | Freq: Once | INTRAVENOUS | Status: AC
  Administered 2023-10-20: 6400 [IU] via INTRAVENOUS
  Filled 2023-10-20: qty 6400

## 2023-10-20 MED ORDER — DIPHENHYDRAMINE HCL 25 MG PO CAPS: 50.0000 mg | ORAL_CAPSULE | Freq: Once | ORAL | Status: AC

## 2023-10-20 MED ORDER — HYDROCODONE-ACETAMINOPHEN 5-325 MG PO TABS
1.0000 | ORAL_TABLET | ORAL | Status: DC | PRN
  Administered 2023-10-21 – 2023-10-22 (×2): 1 via ORAL
  Filled 2023-10-20 (×2): qty 1

## 2023-10-20 MED ORDER — DIPHENHYDRAMINE HCL 50 MG/ML IJ SOLN
50.0000 mg | Freq: Once | INTRAMUSCULAR | Status: AC
  Administered 2023-10-20: 50 mg via INTRAVENOUS
  Filled 2023-10-20: qty 1

## 2023-10-20 MED ORDER — METHYLPREDNISOLONE SODIUM SUCC 40 MG IJ SOLR
40.0000 mg | Freq: Once | INTRAMUSCULAR | Status: AC
  Administered 2023-10-20: 40 mg via INTRAVENOUS
  Filled 2023-10-20: qty 1

## 2023-10-20 NOTE — Assessment & Plan Note (Signed)
 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

## 2023-10-20 NOTE — H&P (Signed)
 History and Physical    Patient: Daniel Mason FMW:[id number] DOB: 1945/06/17 DOA: 10/20/2023 DOS: the patient was seen and examined on 10/20/2023 PCP: Glover Lenis, MD  Patient coming from: Home  Chief Complaint:  Chief Complaint  Patient presents with   Shortness of Breath    HPI: Daniel Mason is a 78 y.o. male with medical history significant for HTN, Barrett's esophagus and BPH and history of prior PE 6 years ago off [facility] for the past 3 years being admitted with an acute PE.  Patient reports shortness of breath and dyspnea on exertion for the past several days following a trip to [place]  and has noted swelling mostly in his left lower extremity.  He denies chest pain, palpitations, cough, fever or chills.In the ED tachycardic to 110, tachypneic to the mid 20s with O2 sat in the high 90s on room air.  Afebrile. Labs notable for first troponin 32 and BNP 131.  CBC notable for WBC 11,000.  BMP unremarkable except for bicarb of 19. EKG showed sinus tachycardia at 109 with no ischemic changes CTA PE protocol showed extensive acute PE involving right main, lobar and segmental pulmonary arteries and left lower lobar segmental pulmonary arteries without evidence of right heart strain.  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 The ED provider spoke with vascular on-call Dr. Laurence who recommended IV heparin  and consultation with Dr. Bard on Monday for consideration of thrombectomy. Patient started on heparin  bolus and infusion. Admission requested.     Review of Systems: As mentioned in the history of present illness. All other systems reviewed and are negative.  Past Medical History:  Diagnosis Date   Allergy    Arthritis    Bladder tumor    Chronic bilateral low back pain with bilateral sciatica    Chronic cough    DVT  (deep venous thrombosis) (HCC) 04/2017   was on blood thinners until hematuria.  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   L lat abdomen, excised in [place]  04/30/19   Dysplastic nevus 06/11/2023   mid to upper back spinal - severe, margins free but close, recheck in 6 mths   Dysplastic nevus 06/11/2023   mid to low back spinal - moderate   Eosinophilic asthma    Gallbladder disease    GERD (gastroesophageal reflux disease)    History of actinic keratoses 08/31/2022   left ear and preauricular bx proven treated already   History of Barrett's esophagus    History of BPH    History of cataract    History of kidney stones    bladder stones and 2 small kidney stones   History of pulmonary embolus (PE)    Hypertension    Thrombocytopenia (HCC)    Tremor    Past Surgical History:  Procedure Laterality Date   CHOLECYSTECTOMY  2010   COLONOSCOPY WITH PROPOFOL  N/A 12/02/2020   Procedure: COLONOSCOPY WITH PROPOFOL ;  Surgeon: Onita Elspeth Sharper, DO;  Location: [facility] ENDOSCOPY;  Service: Gastroenterology;  Laterality: N/A;   CYSTOSCOPY WITH LITHOLAPAXY N/A 11/21/2018   Procedure: CYSTOSCOPY WITH LITHOLAPAXY;  Surgeon: Kassie Sharper SAUNDERS, MD;  Location: [facility] ORS;  Service: Urology;  Laterality: N/A;   ESOPHAGOGASTRODUODENOSCOPY (EGD) WITH PROPOFOL  N/A 12/02/2020   Procedure: ESOPHAGOGASTRODUODENOSCOPY (EGD) WITH PROPOFOL ;  Surgeon: Onita Elspeth Sharper, DO;  Location: [facility] ENDOSCOPY;  Service: Gastroenterology;  Laterality: N/A;  EYE SURGERY Bilateral 2001   cataract extractions   GREEN LIGHT LASER TURP (TRANSURETHRAL RESECTION OF PROSTATE N/A 11/21/2018   Procedure: GREEN LIGHT LASER TURP (TRANSURETHRAL RESECTION OF PROSTATE;  Surgeon: Kassie Ozell SAUNDERS, MD;  Location: [facility] ORS;  Service: Urology;  Laterality: N/A;   HERNIA REPAIR Left 2010   inguinal   NASAL SINUS SURGERY  1997, 2015   x 2   PROSTATE SURGERY  2001, 2011   .  2nd surgery was a turp   PROSTATOTOMY  11/21/2018    TOTAL HIP ARTHROPLASTY Left 2016   TRANSURETHRAL RESECTION OF PROSTATE  11/07/2022   Procedure: TRANSURETHRAL RESECTION OF THE PROSTATE (TURP);  Surgeon: Twylla Glendia BROCKS, MD;  Location: [facility] ORS;  Service: Urology;;   Social History:  reports that he has never smoked. He has never used smokeless tobacco. He reports current alcohol use. He reports that he does not use drugs.  Allergies  Allergen Reactions   Iodine Shortness Of Breath and Cough    Intravenous iodine.  No problem with betadine   Sulfa Antibiotics Hives and Swelling    History reviewed. No pertinent family history.  Prior to Admission medications   Medication Sig Start Date End Date Taking? Authorizing Provider  albuterol  (VENTOLIN  HFA) 108 (90 Base) MCG/ACT inhaler Inhale 2 puffs into the lungs every 6 (six) hours as needed for wheezing or shortness of breath.    [provider]  aspirin EC 81 MG tablet Take 81 mg by mouth daily. Swallow whole.    [provider]  bifidobacterium infantis (ALIGN) capsule Take 1 capsule by mouth daily.    [provider]  doxylamine , Sleep, (UNISOM ) 25 MG tablet Take 25 mg by mouth at bedtime as needed.    [provider]  levocetirizine (XYZAL) 5 MG tablet Take 5 mg by mouth at bedtime.    [provider]  mepolizumab (NUCALA) 100 MG injection Inject 100 mg into the skin every 30 (thirty) days.     [provider]  montelukast (SINGULAIR) 10 MG tablet Take 10 mg by mouth at bedtime.    [provider]  Multiple Vitamin (MULTIVITAMIN WITH MINERALS) TABS tablet Take 1 tablet by mouth daily.    [provider]  omeprazole (PRILOSEC) 40 MG capsule Take 40 mg by mouth at bedtime.    [provider]  tamsulosin  (FLOMAX ) 0.4 MG CAPS capsule TAKE 1 CAPSULE BY MOUTH EVERYDAY AT BEDTIME 08/17/23   Stoioff, Scott C, MD  valsartan (DIOVAN) 40 MG tablet Take 40 mg by mouth every morning.    [provider]   vardenafil  (LEVITRA ) 20 MG tablet 1 tablet 1 hour prior to intercourse as needed 02/15/23   Twylla Glendia BROCKS, MD    Physical Exam: Vitals:   10/20/23 1800 10/20/23 2000 10/20/23 2111 10/20/23 2113  BP: (!) 159/82 (!) 161/84 (!) 148/82   Pulse: 73 71 71   Resp: (!) 21 (!) 30 18   Temp:    98.5 F (36.9 C)  TempSrc:    Oral  SpO2: 99% 96% 97%   Weight:      Height:       Physical Exam Vitals and nursing note reviewed.  Constitutional:      General: He is not in acute distress. HENT:     Head: Normocephalic and atraumatic.  Cardiovascular:     Rate and Rhythm: Normal rate and regular rhythm.     Heart sounds: Normal heart sounds.  Pulmonary:  Effort: Tachypnea present.     Breath sounds: Normal breath sounds.  Abdominal:     Palpations: Abdomen is soft.     Tenderness: There is no abdominal tenderness.  Musculoskeletal:     Left lower leg: No tenderness. Edema present.  Neurological:     Mental Status: Mental status is at baseline.     Labs on Admission: I have personally reviewed following labs and imaging studies  CBC: Recent Labs  Lab 10/20/23 1359  WBC 11.2*  HGB 16.1  HCT 46.8  MCV 87.0  PLT 159   Basic Metabolic Panel: Recent Labs  Lab 10/20/23 1359  NA 140  K 3.7  CL 108  CO2 19*  GLUCOSE 86  BUN 15  CREATININE 1.19  CALCIUM 8.7*   GFR: Estimated Creatinine Clearance: 59.4 mL/min (by C-G formula based on SCr of 1.19 mg/dL). Liver Function Tests: No results for input(s): AST, ALT, ALKPHOS, BILITOT, PROT, ALBUMIN in the last 168 hours. No results for input(s): LIPASE, AMYLASE in the last 168 hours. No results for input(s): AMMONIA in the last 168 hours. Coagulation Profile: Recent Labs  Lab 10/20/23 2104  INR 1.0   Cardiac Enzymes: No results for input(s): CKTOTAL, CKMB, CKMBINDEX, TROPONINI in the last 168 hours. BNP (last 3 results) No results for input(s): PROBNP in the last 8760 hours. HbA1C: No  results for input(s): HGBA1C in the last 72 hours. CBG: No results for input(s): GLUCAP in the last 168 hours. Lipid Profile: No results for input(s): CHOL, HDL, LDLCALC, TRIG, CHOLHDL, LDLDIRECT in the last 72 hours. Thyroid Function Tests: No results for input(s): TSH, T4TOTAL, FREET4, T3FREE, THYROIDAB in the last 72 hours. Anemia Panel: No results for input(s): VITAMINB12, FOLATE, FERRITIN, TIBC, IRON, RETICCTPCT in the last 72 hours. Urine analysis:    Component Value Date/Time   APPEARANCEUR Clear 10/25/2022 1507   GLUCOSEU Negative 10/25/2022 1507   BILIRUBINUR Negative 10/25/2022 1507   PROTEINUR 1+ (A) 10/25/2022 1507   NITRITE Negative 10/25/2022 1507   LEUKOCYTESUR Trace (A) 10/25/2022 1507    Radiological Exams on Admission: CT Angio Chest PE W and/or Wo Contrast Result Date: 10/20/2023 EXAM: CTA of the Chest with contrast for PE 10/20/2023 08:35:43 PM TECHNIQUE: CTA of the chest was performed after the administration of intravenous contrast. Multiplanar reformatted images are provided for review. MIP images are provided for review. Automated exposure control, iterative reconstruction, and/or weight based adjustment of the mA/kV was utilized to reduce the radiation dose to as low as reasonably achievable. COMPARISON: None available. CLINICAL HISTORY: SOB, history of PE. Patient complains of shortness of breath for several days, dyspnea on exertion. Patient has history of asthma, lung sounds clear bilaterally. 1+ pitting edema noted in lower extremities. Patient denies chest pain, dizziness. FINDINGS: PULMONARY ARTERIES: There is adequate opacification of the pulmonary arterial tree. There are extensive intraluminal filling defects identified within the right main pulmonary artery branching into nearly all lobar and segmental pulmonary arteries as well as within the left lower lobar segmental pulmonary arteries in keeping with acute pulmonary  embolism. The central pulmonary arteries are of normal caliber. There is no CT evidence of right heart strain. MEDIASTINUM: Cardiac size is within normal limits. Moderate left anterior descending coronary artery calcification. No pericardial effusion. Mild atherosclerotic calcification within the thoracic aorta. 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 The descending thoracic aorta is of normal caliber. Recommend annual imaging followup by CTA or MRA. This recommendation follows 2010 ACCF/AHA/AATS/ACR/ASA/SCA/SCAI/SIR/STS/SVM  guidelines for the diagnosis and management of patients with thoracic aortic disease. Circulation. 2010; 121: Z733-z630. LYMPH NODES: No mediastinal, hilar or axillary lymphadenopathy. 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 There is bronchial wall thickening and scattered airway impaction within the lower lobes bilaterally in keeping with airway inflammation and possibly the sequelae infectious bronchiolitis or aspiration. No pneumothorax or pleural effusion. UPPER ABDOMEN: Status post cholecystectomy. No acute abnormality within the visualized upper abdomen. Asymmetric scarring within the visualized upper pole of the left kidney. SOFT TISSUES AND BONES: Osseous structures are age appropriate. No acute bone abnormality. No lytic or blastic bone lesion. IMPRESSION: 1. Extensive acute pulmonary embolism involving the right main, lobar, and segmental pulmonary arteries and the left lower lobar segmental pulmonary arteries. No CT evidence of right heart strain. 2. 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 Recommend annual imaging followup by CTA or MRA per 2010 ACCF/AHA/AATS/ACR/ASA/SCA/SCAI/SIR/STS/SVM guidelines. 3. Scattered  ground-glass pulmonary infiltrate within the basilar right upper lobe and right lower lobe, possibly representing atelectasis or mild hemorrhagic infiltrate in the setting of pulmonary infarction. 4. Bronchial wall thickening and scattered airway impaction within the lower lobes bilaterally, consistent with airway inflammation and possibly the sequelae of infectious bronchiolitis or aspiration. Electronically signed by: Dorethia Molt MD 10/20/2023 08:43 PM EDT RP Workstation: [id number]   DG Chest 2 View Result Date: 10/20/2023 CLINICAL DATA:  Short of breath EXAM: CHEST - 2 VIEW COMPARISON:  None Available. FINDINGS: Frontal and lateral views of the chest demonstrate an unremarkable cardiac silhouette. No airspace disease, effusion, or pneumothorax. No acute bony abnormalities. IMPRESSION: 1. No acute intrathoracic process. Electronically Signed   By: Ozell Daring M.D.   On: 10/20/2023 15:14   Data Reviewed for HPI: Relevant notes from primary care and specialist visits, past discharge summaries as available in EHR, including Care Everywhere. Prior diagnostic testing as pertinent to current admission diagnoses Updated medications and problem lists for reconciliation ED course, including vitals, labs, imaging, treatment and response to treatment Triage notes, nursing and pharmacy notes and ED provider's notes Notable results as noted above in HPI      Assessment and Plan: * Acute pulmonary embolism (HCC) History of prior PE off AC x 3 years Patient with submassive PE without heart strain at this time Continue heparin  infusion Vascular consult Will get lower extremity venous ultrasound Close hemodynamic monitoring Supplemental O2 if needed Will need lifelong anticoagulation  Moderate persistent asthma without complication Albuterol  as needed  History of Barrett's esophagus No acute issues.  Continue home meds  Hypertension Continue home meds  BPH (benign prostatic  hyperplasia) Continue home meds        DVT prophylaxis: Heparin  infusion  Consults: Vascular, Dr. Laurence  Advance Care Planning: full code  Family Communication: Son at bedside  Disposition Plan: Back to previous home environment  Severity of Illness: The appropriate patient status for this patient is INPATIENT. Inpatient status is judged to be reasonable and necessary in order to provide the required intensity of service to ensure the patient's safety. The patient's presenting symptoms, physical exam findings, and initial radiographic and laboratory data in the context of their chronic comorbidities is felt to place them at high risk for further clinical deterioration. Furthermore, it is not anticipated that the patient will be medically stable for discharge from the hospital within 2  midnights of admission.   * I certify that at the point of admission it is my clinical judgment that the patient will require inpatient hospital care spanning beyond 2 midnights from the point of admission due to high intensity of service, high risk for further deterioration and high frequency of surveillance required.*  Author: Delayne LULLA Solian, MD 10/20/2023 10:44 PM  For on call review [email or web address].

## 2023-10-20 NOTE — Assessment & Plan Note (Signed)
-   Continue home meds

## 2023-10-20 NOTE — Hospital Course (Signed)
 RCC, Barrett's esophagus, BPH and HTN

## 2023-10-20 NOTE — Hospital Course (Signed)
 Daniel Mason

## 2023-10-20 NOTE — Assessment & Plan Note (Signed)
 Albuterol as needed

## 2023-10-20 NOTE — Consult Note (Signed)
 Pharmacy Consult Note - Anticoagulation  Pharmacy Consult for heparin  Indication: pulmonary embolus  PATIENT MEASUREMENTS: Height: 5' 10 (177.8 cm) Weight: 92.5 kg (204 lb) IBW/kg (Calculated) : 73 HEPARIN  DW (KG): 91.6  VITAL SIGNS: Temp: 98.2 F (36.8 C) (09/13 1357) Temp Source: Oral (09/13 1357) BP: 159/82 (09/13 1800) Pulse Rate: 73 (09/13 1800)  Recent Labs    10/20/23 1359 10/20/23 1613  HGB 16.1  --   HCT 46.8  --   PLT 159  --   CREATININE 1.19  --   TROPONINIHS 27* 32*    Estimated Creatinine Clearance: 59.4 mL/min (by C-G formula based on SCr of 1.19 mg/dL).  PAST MEDICAL HISTORY: Past Medical History:  Diagnosis Date   Allergy    Arthritis    Bladder tumor    Chronic bilateral low back pain with bilateral sciatica    Chronic cough    DVT (deep venous thrombosis) (HCC) 04/2017   was on blood thinners until hematuria.  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 male with PMH including HTN, BPH, thrombocytopenia, provoked DVT no longer on Eliquis  is presenting with pulmonary embolism (PE). CT chest shows extensive PE involving right main, lobar, and subsegmental pulmonary arteries as well as LLL segmental pulmonary arteries. PLT are within normal limits. Patient is not on chronic  anticoagulation per chart review. Pharmacy has been consulted to initiate and manage heparin  intravenous infusion.  Pertinent medications:  No chronic anticoagulation per chart review Aspirin 81mg   Goal(s) of therapy: Heparin  level 0.3 - 0.7 units/mL Monitor platelets by anticoagulation protocol: Yes   Baseline anticoagulation labs: Recent Labs    10/20/23 1359  HGB 16.1  PLT 159   Baseline aPTT, INR ordered  Date Time aPTT/HL Rate/Comment    PLAN: Give 6400 units bolus x1; then start heparin  infusion at 1500 units/hour. Check heparin  level in 8 hours, then daily once at least two levels are consecutively therapeutic. Monitor CBC daily while on heparin  infusion.   Will M. Lenon, PharmD, BCPS Clinical Pharmacist 10/20/2023 8:59 PM

## 2023-10-20 NOTE — Assessment & Plan Note (Signed)
 No acute issues. Continue home meds

## 2023-10-20 NOTE — ED Triage Notes (Signed)
 Pt c/o SHOB for several days, DOE. Pt has history of asthma, lung sounds clear bilaterally.  Hx pulmonary embolism 1+ pitting edema noted in lower extremities noted. Pt denies CP, dizziness.

## 2023-10-20 NOTE — Consult Note (Signed)
 VASCULAR SURGERY CONSULTATION   Requested by:  Daniel Eagles, MD [facility] ED)  Reason for consultation: PE    History of Present Illness   Daniel Mason is a 78 y.o. (November 28, 1945) male who presents with cc: SOB.  Pt has unknown history of prior PE.  Pt was on Daniel Mason  for 3 year.  He had to stop use due to hematuria.  Pt has no known thrombophilia work-up.  Pt denies any hemoptysis.  Pt notes some SOB.   Past Medical History:  Diagnosis Date   Allergy    Arthritis    Bladder tumor    Chronic bilateral low back pain with bilateral sciatica    Chronic cough    DVT (deep venous thrombosis) (HCC) 04/2017   was on blood thinners until hematuria.  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   L lat abdomen, excised in [place]  04/30/19   Dysplastic nevus 06/11/2023   mid to upper back spinal - severe, margins free but close, recheck in 6 mths   Dysplastic nevus 06/11/2023   mid to low back spinal - moderate   Eosinophilic asthma    Gallbladder disease    GERD (gastroesophageal reflux disease)    History of actinic keratoses 08/31/2022   left ear and preauricular bx proven treated already   History of Barrett's esophagus    History of BPH    History of cataract    History of kidney stones    bladder stones and 2 small kidney stones   History of pulmonary embolus (PE)    Hypertension    Thrombocytopenia (HCC)    Tremor     Past Surgical History:  Procedure Laterality Date   CHOLECYSTECTOMY  2010   COLONOSCOPY WITH PROPOFOL  N/A 12/02/2020   Procedure: COLONOSCOPY WITH PROPOFOL ;  Surgeon: Daniel Elspeth Sharper, DO;  Location: [facility] ENDOSCOPY;  Service: Gastroenterology;  Laterality: N/A;   CYSTOSCOPY WITH LITHOLAPAXY N/A 11/21/2018   Procedure: CYSTOSCOPY WITH LITHOLAPAXY;  Surgeon: Daniel Mason SAUNDERS, MD;  Location: [facility] ORS;  Service: Urology;  Laterality: N/A;   ESOPHAGOGASTRODUODENOSCOPY (EGD) WITH PROPOFOL  N/A 12/02/2020   Procedure: ESOPHAGOGASTRODUODENOSCOPY  (EGD) WITH PROPOFOL ;  Surgeon: Daniel Elspeth Sharper, DO;  Location: [facility] ENDOSCOPY;  Service: Gastroenterology;  Laterality: N/A;   EYE SURGERY Bilateral 2001   cataract extractions   GREEN LIGHT LASER TURP (TRANSURETHRAL RESECTION OF PROSTATE N/A 11/21/2018   Procedure: GREEN LIGHT LASER TURP (TRANSURETHRAL RESECTION OF PROSTATE;  Surgeon: Daniel Mason SAUNDERS, MD;  Location: [facility] ORS;  Service: Urology;  Laterality: N/A;   HERNIA REPAIR Left 2010   inguinal   NASAL SINUS SURGERY  1997, 2015   x 2   PROSTATE SURGERY  2001, 2011   .  2nd surgery was a turp   PROSTATOTOMY  11/21/2018   TOTAL HIP ARTHROPLASTY Left 2016   TRANSURETHRAL RESECTION OF PROSTATE  11/07/2022   Procedure: TRANSURETHRAL RESECTION OF THE PROSTATE (TURP);  Surgeon: Daniel Glendia BROCKS, MD;  Location: [facility] ORS;  Service: Urology;;     Social History   Socioeconomic History   Marital status: Married    Spouse name: Daniel Mason   Number of children: Not on file   Years of education: Not on file   Highest education level: Not on file  Occupational History   Occupation: owns a tax accounting firm  Tobacco Use   Smoking status: Never   Smokeless tobacco: Never  Vaping Use   Vaping status: Never Used  Substance and Sexual  Activity   Alcohol use: Yes    Comment: occ   Drug use: Never   Sexual activity: Not on file  Other Topics Concern   Not on file  Social History Narrative   Not on file   Social Drivers of Health   Financial Resource Strain: Low Risk  (07/25/2023)   Received from [facility]   Overall Financial Resource Strain (CARDIA)    Difficulty of Paying Living Expenses: Not hard at all  Food Insecurity: No Food Insecurity (07/25/2023)   Received from [facility]   Hunger Vital Sign    Within the past 12 months, you worried that your food would run out before you got the money to buy more.: Never true    Within the past 12 months, the food you bought just didn't last and  you didn't have money to get more.: Never true  Transportation Needs: No Transportation Needs (07/25/2023)   Received from [facility] - Transportation    In the past 12 months, has lack of transportation kept you from medical appointments or from getting medications?: No    Lack of Transportation (Non-Medical): No  Physical Activity: Not on file  Stress: Not on file  Social Connections: Not on file  Intimate Partner Violence: Not on file   Family History: unable to detail family medical history  Current Facility-Administered Medications  Medication Dose Route Frequency Provider Last Rate Last Admin   heparin  ADULT infusion 100 units/mL (25000 units/250mL)  1,500 Units/hr Intravenous Continuous Daniel Mason, RPH 15 mL/hr at 10/20/23 2111 1,500 Units/hr at 10/20/23 2111   Current Outpatient Medications  Medication Sig Dispense Refill   albuterol  (VENTOLIN  HFA) 108 (90 Base) MCG/ACT inhaler Inhale 2 puffs into the lungs every 6 (six) hours as needed for wheezing or shortness of breath.     aspirin EC 81 MG tablet Take 81 mg by mouth daily. Swallow whole.     bifidobacterium infantis (ALIGN) capsule Take 1 capsule by mouth daily.     doxylamine , Sleep, (UNISOM ) 25 MG tablet Take 25 mg by mouth at bedtime as needed.     levocetirizine (XYZAL) 5 MG tablet Take 5 mg by mouth at bedtime.     mepolizumab (NUCALA) 100 MG injection Inject 100 mg into the skin every 30 (thirty) days.      montelukast (SINGULAIR) 10 MG tablet Take 10 mg by mouth at bedtime.     Multiple Vitamin (MULTIVITAMIN WITH MINERALS) TABS tablet Take 1 tablet by mouth daily.     omeprazole (PRILOSEC) 40 MG capsule Take 40 mg by mouth at bedtime.     tamsulosin  (FLOMAX ) 0.4 MG CAPS capsule TAKE 1 CAPSULE BY MOUTH EVERYDAY AT BEDTIME 90 capsule 1   valsartan (DIOVAN) 40 MG tablet Take 40 mg by mouth every morning.     vardenafil  (LEVITRA ) 20 MG tablet 1 tablet 1 hour prior to intercourse as  needed 12 tablet 3    Allergies  Allergen Reactions   Iodine Shortness Of Breath and Cough    Intravenous iodine.  No problem with betadine   Sulfa Antibiotics Hives and Swelling    REVIEW OF SYSTEMS (negative unless checked):   Cardiac:  []  Chest pain or chest pressure? [x]  Shortness of breath upon activity? [x]  Shortness of breath when lying flat? []  Irregular heart rhythm?  Vascular:  []  Pain in calf, thigh, or hip brought on by walking? []  Pain in feet at night that  wakes you up from your sleep? []  Blood clot in your veins? []  Leg swelling?  Pulmonary:  []  Oxygen at home? []  Productive cough? []  Wheezing?  Neurologic:  []  Sudden weakness in arms or legs? []  Sudden numbness in arms or legs? []  Sudden onset of difficult speaking or slurred speech? []  Temporary loss of vision in one eye? []  Problems with dizziness?  Gastrointestinal:  []  Blood in stool? []  Vomited blood?  Genitourinary:  []  Burning when urinating? []  Blood in urine?  Psychiatric:  []  Major depression  Hematologic:  []  Bleeding problems? []  Problems with blood clotting?  Dermatologic:  []  Rashes or ulcers?  Constitutional:  []  Fever or chills?  Ear/Nose/Throat:  []  Change in hearing? []  Nose bleeds? []  Sore throat?  Musculoskeletal:  []  Back pain? []  Joint pain? []  Muscle pain?   Physical Examination     Vitals:   10/20/23 1800 10/20/23 2000 10/20/23 2111 10/20/23 2113  BP: (!) 159/82 (!) 161/84 (!) 148/82   Pulse: 73 71 71   Resp: (!) 21 (!) 30 18   Temp:    98.5 F (36.9 C)  TempSrc:    Oral  SpO2: 99% 96% 97%   Weight:      Height:       Body mass index is 29.27 kg/m.  General Alert, O x 3, WD, NAD  Pulmonary Sym exp, good B air movt, CTA B, minimal tachypnea, no accessory muscle use  Cardiac RRR, Nl S1, S2, no Murmurs, No rubs, No S3,S4  Musculo- skeletal BLE: 1-2+ edema, no TTP   Laboratory   CBC    Latest Ref Rng & Units 10/20/2023    1:59 PM  11/03/2022    2:29 PM 11/19/2018    2:48 PM  CBC  WBC 4.0 - 10.5 K/uL 11.2  5.6  5.7   Hemoglobin 13.0 - 17.0 g/dL 83.8  84.5  83.9   Hematocrit 39.0 - 52.0 % 46.8  43.9  47.1   Platelets 150 - 400 K/uL 159  208  PLATELET CLUMPS NOTED ON SMEAR, COUNT APPEARS DECREASED     BMP    Latest Ref Rng & Units 10/20/2023    1:59 PM 11/03/2022    2:29 PM 11/19/2018    2:48 PM  BMP  Glucose 70 - 99 mg/dL 86  872  896   BUN 8 - 23 mg/dL 15  17  22    Creatinine 0.61 - 1.24 mg/dL 8.80  8.81  8.68   Sodium 135 - 145 mmol/L 140  139  141   Potassium 3.5 - 5.1 mmol/L 3.7  3.9  4.0   Chloride 98 - 111 mmol/L 108  109  106   CO2 22 - 32 mmol/L 19  22  28    Calcium 8.9 - 10.3 mg/dL 8.7  8.1  8.8     Coagulation Lab Results  Component Value Date   INR 1.0 10/20/2023   No results found for: PTT  Lipids No results found for: CHOL, TRIG, HDL, CHOLHDL, VLDL, LDLCALC, LDLDIRECT   Radiology     CT Angio Chest PE W and/or Wo Contrast Result Date: 10/20/2023 EXAM: CTA of the Chest with contrast for PE 10/20/2023 08:35:43 PM TECHNIQUE: CTA of the chest was performed after the administration of intravenous contrast. Multiplanar reformatted images are provided for review. MIP images are provided for review. Automated exposure control, iterative reconstruction, and/or weight based adjustment of the mA/kV was utilized to reduce the radiation dose to as low  as reasonably achievable. COMPARISON: None available. CLINICAL HISTORY: SOB, history of PE. Patient complains of shortness of breath for several days, dyspnea on exertion. Patient has history of asthma, lung sounds clear bilaterally. 1+ pitting edema noted in lower extremities. Patient denies chest pain, dizziness. FINDINGS: PULMONARY ARTERIES: There is adequate opacification of the pulmonary arterial tree. There are extensive intraluminal filling defects identified within the right main pulmonary artery branching into nearly all lobar and  segmental pulmonary arteries as well as within the left lower lobar segmental pulmonary arteries in keeping with acute pulmonary embolism. The central pulmonary arteries are of normal caliber. There is no CT evidence of right heart strain. MEDIASTINUM: Cardiac size is within normal limits. Moderate left anterior descending coronary artery calcification. No pericardial effusion. Mild atherosclerotic calcification within the thoracic aorta. 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 The descending thoracic aorta is of normal caliber. Recommend annual imaging followup by CTA or MRA. This recommendation follows 2010 ACCF/AHA/AATS/ACR/ASA/SCA/SCAI/SIR/STS/SVM guidelines for the diagnosis and management of patients with thoracic aortic disease. Circulation. 2010; 121: Z733-z630. LYMPH NODES: No mediastinal, hilar or axillary lymphadenopathy. 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 There is bronchial wall thickening and scattered airway impaction within the lower lobes bilaterally in keeping with airway inflammation and possibly the sequelae infectious bronchiolitis or aspiration. No pneumothorax or pleural effusion. UPPER ABDOMEN: Status post cholecystectomy. No acute abnormality within the visualized upper abdomen. Asymmetric scarring within the visualized upper pole of the left kidney. SOFT TISSUES AND BONES: Osseous structures are age appropriate. No acute bone abnormality. No lytic or blastic bone lesion. IMPRESSION: 1. Extensive acute pulmonary embolism involving the right main, lobar, and segmental pulmonary arteries and the left lower lobar segmental pulmonary arteries. No CT evidence of right heart strain. 2. 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  Recommend annual imaging followup by CTA or MRA per 2010 ACCF/AHA/AATS/ACR/ASA/SCA/SCAI/SIR/STS/SVM guidelines. 3. Scattered ground-glass pulmonary infiltrate within the basilar right upper lobe and right lower lobe, possibly representing atelectasis or mild hemorrhagic infiltrate in the setting of pulmonary infarction. 4. Bronchial wall thickening and scattered airway impaction within the lower lobes bilaterally, consistent with airway inflammation and possibly the sequelae of infectious bronchiolitis or aspiration. Electronically signed by: Dorethia Molt MD 10/20/2023 08:43 PM EDT RP Workstation: [id number]   DG Chest 2 View Result Date: 10/20/2023 CLINICAL DATA:  Short of breath EXAM: CHEST - 2 VIEW COMPARISON:  None Available. FINDINGS: Frontal and lateral views of the chest demonstrate an unremarkable cardiac silhouette. No airspace disease, effusion, or pneumothorax. No acute bony abnormalities. IMPRESSION: 1. No acute intrathoracic process. Electronically Signed   By: Ozell Daring M.D.   On: 10/20/2023 15:14     Medical Decision Making   Daniel Mason is a 78 y.o. male who presents with: recurrent PE  No clinical evidence of decompensation: minimal tachypnea, SaO2 97% on RA, and normal cardiac rate Anticoagulate with Heparin  drip Will post pt for PE thrombectomy on Monday with Dr. Marea   Redell Door, MD, FACS, FSVS Covering for [facility]: [phone]  10/20/2023, 10:12 PM

## 2023-10-20 NOTE — ED Provider Notes (Signed)
 [facility] Provider Note    Event Date/Time   First MD Initiated Contact with Patient 10/20/23 1530     (approximate)   History   Shortness of Breath   HPI  Daniel Mason is a 78 y.o. male who presents to the emergency department today because of concerns for shortness of breath.  Symptoms started a few days ago.  He notices it particularly when he was exerting himself.  He denies any associated chest pain.  Has noticed some left ankle swelling.  Has a history of PE roughly 6 years ago.  He is currently not on any blood thinners except for aspirin.  This does remind him of the symptoms he had with his previous pulmonary embolism.  No fevers.     Physical Exam   Triage Vital Signs: ED Triage Vitals  Encounter Vitals Group     BP 10/20/23 1357 (!) 163/99     Girls Systolic BP Percentile --      Girls Diastolic BP Percentile --      Boys Systolic BP Percentile --      Boys Diastolic BP Percentile --      Pulse Rate 10/20/23 1357 (!) 110     Resp 10/20/23 1357 20     Temp 10/20/23 1357 98.2 F (36.8 C)     Temp Source 10/20/23 1357 Oral     SpO2 10/20/23 1357 94 %     Weight 10/20/23 1358 204 lb (92.5 kg)     Height 10/20/23 1358 5' 10 (1.778 m)     Head Circumference --      Peak Flow --      Pain Score 10/20/23 1358 0     Pain Loc --      Pain Education --      Exclude from Growth Chart --     Most recent vital signs: Vitals:   10/20/23 1357  BP: (!) 163/99  Pulse: (!) 110  Resp: 20  Temp: 98.2 F (36.8 C)  SpO2: 94%   General: Awake, alert, oriented. CV:  Good peripheral perfusion. Tachycardia. Resp:  Normal effort. Lungs clear. Abd:  No distention.  Other:  Edema to left lower extremity. Non tender. No erythema.    ED Results / Procedures / Treatments   Labs (all labs ordered are listed, but only abnormal results are displayed) Labs Reviewed  BASIC METABOLIC PANEL WITH GFR - Abnormal; Notable for the following components:       Result Value   CO2 19 (*)    Calcium 8.7 (*)    All other components within normal limits  CBC - Abnormal; Notable for the following components:   WBC 11.2 (*)    All other components within normal limits  BRAIN NATRIURETIC PEPTIDE - Abnormal; Notable for the following components:   B Natriuretic Peptide 131.0 (*)    All other components within normal limits  TROPONIN I (HIGH SENSITIVITY) - Abnormal; Notable for the following components:   Troponin I (High Sensitivity) 27 (*)    All other components within normal limits  TROPONIN I (HIGH SENSITIVITY) - Abnormal; Notable for the following components:   Troponin I (High Sensitivity) 32 (*)    All other components within normal limits  APTT  PROTIME-INR  HEPARIN  LEVEL (UNFRACTIONATED)  CBC     EKG  I, Guadalupe Eagles, attending physician, personally viewed and interpreted this EKG  EKG Time: 1401 Rate: 109 Rhythm: sinus tachycardia Axis: left axis deviation Intervals: qtc  482 QRS: LAFB, LVH ST changes: no st elevation Impression: abnormal ekg  RADIOLOGY I independently interpreted and visualized the CXR. My interpretation: No pneumonia Radiology interpretation:  IMPRESSION:  1. No acute intrathoracic process.      PROCEDURES:  Critical Care performed: Yes  CRITICAL CARE Performed by: Guadalupe Eagles   Total critical care time: 35 minutes  Critical care time was exclusive of separately billable procedures and treating other patients.  Critical care was necessary to treat or prevent imminent or life-threatening deterioration.  Critical care was time spent personally by me on the following activities: development of treatment plan with patient and/or surrogate as well as nursing, discussions with consultants, evaluation of patient's response to treatment, examination of patient, obtaining history from patient or surrogate, ordering and performing treatments and interventions, ordering and review of  laboratory studies, ordering and review of radiographic studies, pulse oximetry and re-evaluation of patient's condition.   Procedures    MEDICATIONS ORDERED IN ED: Medications - No data to display   IMPRESSION / MDM / ASSESSMENT AND PLAN / ED COURSE  I reviewed the triage vital signs and the nursing notes.                              Differential diagnosis includes, but is not limited to, pneumonia, ACS, CHF, PE, anemia  Patient's presentation is most consistent with acute presentation with potential threat to life or bodily function.   The patient is on the cardiac monitor to evaluate for evidence of arrhythmia and/or significant heart rate changes.  Patient presented to the emergency department today because of concerns for shortness of breath.  Patient has history of PE and this reminds him of previous pulmonary embolism.  Workup ordered from triage without obvious etiology.  Patient did have mild elevation in troponin and BNP however no edema on x-ray.  Will check CT angio to evaluate for PE.  CT is positive for bilateral PEs.  IV heparin  was ordered.  No right heart strain on CT scan.  Repeat troponin slightly more elevated.  I discussed with Dr. Laurence with vascular surgery who will evaluate the patient.  Discussed with Dr. Cleatus with the hospitalist service who will evaluate for admission.      FINAL CLINICAL IMPRESSION(S) / ED DIAGNOSES   Final diagnoses:  Acute pulmonary embolism, unspecified pulmonary embolism type, unspecified whether acute cor pulmonale present [facility])     Note:  This document was prepared using Dragon voice recognition software and may include unintentional dictation errors.    Eagles Guadalupe, MD 10/20/23 2226

## 2023-10-21 ENCOUNTER — Inpatient Hospital Stay

## 2023-10-21 DIAGNOSIS — I2699 Other pulmonary embolism without acute cor pulmonale: Secondary | ICD-10-CM | POA: Diagnosis not present

## 2023-10-21 LAB — CBG MONITORING, ED: Glucose-Capillary: 159 mg/dL — ABNORMAL HIGH (ref 70–99)

## 2023-10-21 LAB — CBC
HCT: 42.6 % (ref 39.0–52.0)
Hemoglobin: 15.1 g/dL (ref 13.0–17.0)
MCH: 30 pg (ref 26.0–34.0)
MCHC: 35.4 g/dL (ref 30.0–36.0)
MCV: 84.7 fL (ref 80.0–100.0)
Platelets: 166 K/uL (ref 150–400)
RBC: 5.03 MIL/uL (ref 4.22–5.81)
RDW: 13.2 % (ref 11.5–15.5)
WBC: 9.8 K/uL (ref 4.0–10.5)
nRBC: 0 % (ref 0.0–0.2)

## 2023-10-21 LAB — HEPARIN LEVEL (UNFRACTIONATED)
Heparin Unfractionated: 0.95 [IU]/mL — ABNORMAL HIGH (ref 0.30–0.70)
Heparin Unfractionated: 0.63 [IU]/mL (ref 0.30–0.70)

## 2023-10-21 MED ORDER — DOXYLAMINE SUCCINATE (SLEEP) 25 MG PO TABS
25.0000 mg | ORAL_TABLET | Freq: Every evening | ORAL | Status: DC | PRN
  Administered 2023-10-21: 25 mg via ORAL
  Filled 2023-10-21 (×2): qty 1

## 2023-10-21 MED ORDER — ALBUTEROL SULFATE (2.5 MG/3ML) 0.083% IN NEBU
2.5000 mg | INHALATION_SOLUTION | RESPIRATORY_TRACT | Status: DC | PRN
  Administered 2023-10-21 – 2023-10-25 (×7): 2.5 mg via RESPIRATORY_TRACT
  Filled 2023-10-21 (×7): qty 3

## 2023-10-21 NOTE — ED Notes (Signed)
 Lunch tray provided to patient.

## 2023-10-21 NOTE — Progress Notes (Signed)
      Daily Progress Note  Vitals:   10/21/23 0516 10/21/23 0700  BP: 139/81 (!) 153/70  Pulse: 64 (!) 56  Resp: 17   Temp: 97.8 F (36.6 C)   SpO2: 94% 94%   Clinically unchanged Continue heparin  drip Keep NPO after midnight for pulmonary thrombectomy tomorrow  Redell Door, MD, FACS, FSVS Covering for [place] [facility]: [phone]  10/21/2023, 7:53 AM

## 2023-10-21 NOTE — ED Notes (Signed)
 Patient requested nebulizer treatment; see MAR.

## 2023-10-21 NOTE — Progress Notes (Signed)
 Progress Note   Patient: Daniel Mason FMW:[id number] DOB: [birth year] DOA: 10/20/2023     1 DOS: the patient was seen and examined on 10/21/2023   Brief hospital course: From HPI Daniel Mason is a 78 y.o. male with medical history significant for HTN, Barrett's esophagus and BPH and history of prior PE 6 years ago off [facility] for the past 3 years being admitted with an acute PE.  Patient reports shortness of breath and dyspnea on exertion for the past several days following a trip to [place]  and has noted swelling mostly in his left lower extremity.  He denies chest pain, palpitations, cough, fever or chills.In the ED tachycardic to 110, tachypneic to the mid 20s with O2 sat in the high 90s on room air.  Afebrile. Labs notable for first troponin 32 and BNP 131.  CBC notable for WBC 11,000.  BMP unremarkable except for bicarb of 19. EKG showed sinus tachycardia at 109 with no ischemic changes CTA PE protocol showed extensive acute PE involving right main, lobar and segmental pulmonary arteries and left lower lobar segmental pulmonary arteries without evidence of right heart strain.  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 The ED provider spoke with vascular on-call Dr. Laurence who recommended IV heparin  and consultation with Dr. Bard on Monday for consideration of thrombectomy. Patient started on heparin  bolus and infusion. Admission requested.     Assessment and Plan:  Acute pulmonary embolism (HCC) History of prior PE off [facility] x 3 years Left-sided DVT of lower extremities Patient with submassive PE without heart strain at this time Follow-up on echocardiogram Close hemodynamic monitoring Supplemental O2 if needed Continue heparin  drip Patient has been seen by vascular surgeon today and being planned for pulmonary thrombectomy  tomorrow   Moderate persistent asthma without complication Albuterol  as needed   History of Barrett's esophagus No acute issues.  Continue home meds   Hypertension Continue home meds   BPH (benign prostatic hyperplasia) Continue home meds   DVT prophylaxis: Heparin  infusion   Consults: Vascular, Dr. Laurence   Advance Care Planning: full code   Family Communication: Son at bedside   Disposition Plan: Back to previous home environment   Subjective:  Patient seen and examined at bedside this morning Admits to improvement in respiratory function Denies nausea vomiting chest pain or cough  Physical Exam: Vitals and nursing note reviewed.  Constitutional:      General: He is not in acute distress. HENT:     Head: Normocephalic and atraumatic.  Cardiovascular:     Rate and Rhythm: Normal rate and regular rhythm.     Heart sounds: Normal heart sounds.  Pulmonary: Clear to auscultation bilaterally Abdominal:     Palpations: Abdomen is soft.     Tenderness: There is no abdominal tenderness.  Musculoskeletal:     Left lower leg: No tenderness. Edema present.  Neurological:     Mental Status: Mental status is at baseline.      Vitals:   10/21/23 1221 10/21/23 1300 10/21/23 1514 10/21/23 1655  BP:  (!) 141/72 138/70 139/71  Pulse:  73 71 68  Resp:  (!) 26 (!) 21   Temp: 97.9 F (36.6 C)  98.2 F (36.8 C) (!) 97.5 F (36.4 C)  TempSrc: Oral  Oral Oral  SpO2:  95% 95% 98%  Weight:      Height:  Data Reviewed: Doppler ultrasound of lower extremity showing popliteal vein thrombosis on the left CT angio of the chest showing bilateral PE    Latest Ref Rng & Units 10/21/2023    7:16 AM 10/20/2023    1:59 PM 11/03/2022    2:29 PM  CBC  WBC 4.0 - 10.5 K/uL 9.8  11.2  5.6   Hemoglobin 13.0 - 17.0 g/dL 84.8  83.8  84.5   Hematocrit 39.0 - 52.0 % 42.6  46.8  43.9   Platelets 150 - 400 K/uL 166  159  208       Latest Ref Rng & Units 10/20/2023    1:59 PM  11/03/2022    2:29 PM 11/19/2018    2:48 PM  BMP  Glucose 70 - 99 mg/dL 86  872  896   BUN 8 - 23 mg/dL 15  17  22    Creatinine 0.61 - 1.24 mg/dL 8.80  8.81  8.68   Sodium 135 - 145 mmol/L 140  139  141   Potassium 3.5 - 5.1 mmol/L 3.7  3.9  4.0   Chloride 98 - 111 mmol/L 108  109  106   CO2 22 - 32 mmol/L 19  22  28    Calcium 8.9 - 10.3 mg/dL 8.7  8.1  8.8      Family Communication: None present at bedside  Disposition: Home when medically stable  Time spent: 55 minutes  Author: Drue ONEIDA Potter, MD 10/21/2023 5:37 PM  For on call review [email or web address].

## 2023-10-21 NOTE — ED Notes (Signed)
 Hospitalist at bedside

## 2023-10-21 NOTE — ED Notes (Signed)
 Lab at bedside

## 2023-10-21 NOTE — Consult Note (Signed)
 Pharmacy Consult Note - Anticoagulation  Pharmacy Consult for heparin  Indication: pulmonary embolus  PATIENT MEASUREMENTS: Height: 5' 10 (177.8 cm) Weight: 92.5 kg (204 lb) IBW/kg (Calculated) : 73 HEPARIN  DW (KG): 91.6  VITAL SIGNS: Temp: 97.8 F (36.6 C) (09/14 0516) Temp Source: Oral (09/14 0516) BP: 153/70 (09/14 0700) Pulse Rate: 56 (09/14 0700)  Recent Labs    10/20/23 1359 10/20/23 1613 10/20/23 2104 10/21/23 0716  HGB 16.1  --   --  15.1  HCT 46.8  --   --  42.6  PLT 159  --   --  166  APTT  --   --  34  --   LABPROT  --   --  14.0  --   INR  --   --  1.0  --   HEPARINUNFRC  --   --   --  0.95*  CREATININE 1.19  --   --   --   TROPONINIHS 27* 32*  --   --     Estimated Creatinine Clearance: 59.4 mL/min (by C-G formula based on SCr of 1.19 mg/dL).  PAST MEDICAL HISTORY: Past Medical History:  Diagnosis Date   Allergy    Arthritis    Bladder tumor    Chronic bilateral low back pain with bilateral sciatica    Chronic cough    DVT (deep venous thrombosis) (HCC) 04/2017   was on blood thinners until hematuria.  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 male with PMH including HTN, BPH, thrombocytopenia, provoked DVT no longer on Eliquis  is presenting with pulmonary embolism (PE). CT chest  shows extensive PE involving right main, lobar, and subsegmental pulmonary arteries as well as LLL segmental pulmonary arteries. PLT are within normal limits. Patient is not on chronic anticoagulation per chart review. Pharmacy has been consulted to initiate and manage heparin  intravenous infusion.  Pertinent medications:  No chronic anticoagulation per chart review Aspirin 81mg   Goal(s) of therapy: Heparin  level 0.3 - 0.7 units/mL Monitor platelets by anticoagulation protocol: Yes   Baseline anticoagulation labs: Recent Labs    10/20/23 1359 10/20/23 2104 10/21/23 0716  APTT  --  34  --   INR  --  1.0  --   HGB 16.1  --  15.1  PLT 159  --  166   Baseline aPTT, INR ordered  Date Time HL Rate/Comment 09/14 0716 0.95 SUPRAtherapeutic@1500  units/hr   PLAN: Decrease heparin  infusion to 1300 units/hour. Check heparin  level in 8 hours after rate change, then daily once at least two levels are consecutively therapeutic. Monitor CBC daily while on heparin  infusion.   Meilech Virts A Lindyn Vossler, PharmD Clinical Pharmacist 10/21/2023 7:53 AM

## 2023-10-21 NOTE — Consult Note (Signed)
 Pharmacy Consult Note - Anticoagulation  Pharmacy Consult for heparin  Indication: pulmonary embolus  PATIENT MEASUREMENTS: Height: 5' 10 (177.8 cm) Weight: 92.5 kg (204 lb) IBW/kg (Calculated) : 73 HEPARIN  DW (KG): 91.6  VITAL SIGNS: Temp: 97.5 F (36.4 C) (09/14 1655) Temp Source: Oral (09/14 1655) BP: 139/71 (09/14 1655) Pulse Rate: 68 (09/14 1655)  Recent Labs    10/20/23 1359 10/20/23 1359 10/20/23 1613 10/20/23 2104 10/21/23 0716 10/21/23 1700  HGB 16.1  --   --   --  15.1  --   HCT 46.8  --   --   --  42.6  --   PLT 159  --   --   --  166  --   APTT  --   --   --  34  --   --   LABPROT  --   --   --  14.0  --   --   INR  --   --   --  1.0  --   --   HEPARINUNFRC  --    < >  --   --  0.95* 0.63  CREATININE 1.19  --   --   --   --   --   TROPONINIHS 27*  --  32*  --   --   --    < > = values in this interval not displayed.    Estimated Creatinine Clearance: 59.4 mL/min (by C-G formula based on SCr of 1.19 mg/dL).  PAST MEDICAL HISTORY: Past Medical History:  Diagnosis Date   Allergy    Arthritis    Bladder tumor    Chronic bilateral low back pain with bilateral sciatica    Chronic cough    DVT (deep venous thrombosis) (HCC) 04/2017   was on blood thinners until hematuria.  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 male with PMH including HTN, BPH, thrombocytopenia, provoked DVT no longer on Eliquis  is presenting with pulmonary embolism (PE). CT chest shows extensive PE involving right main, lobar, and subsegmental pulmonary arteries as well as LLL segmental pulmonary arteries. PLT are within normal limits. Patient is not on chronic anticoagulation per chart review. Pharmacy has been consulted to initiate and manage heparin  intravenous infusion.  Pertinent medications:  No chronic anticoagulation per chart review Aspirin 81mg   Goal(s) of therapy: Heparin  level 0.3 - 0.7 units/mL Monitor platelets by anticoagulation protocol: Yes   Baseline anticoagulation labs: Recent Labs    10/20/23 1359 10/20/23 2104 10/21/23 0716  APTT  --  34  --   INR  --  1.0  --   HGB 16.1  --  15.1  PLT 159  --  166   Baseline aPTT, INR ordered  Date Time HL Rate/Comment 09/14 0716 0.95 SUPRAtherapeutic@1500  units/hr 09/14 1700 0.63 Therapeutic x 1  PLAN: HL therapeutic x 1 Continue heparin  infusion at 1300 units/hour. Recheck HL in 8 hours to confirm Monitor CBC daily while on heparin  infusion.   Kayla JULIANNA Blew, PharmD Clinical Pharmacist 10/21/2023 5:38 PM

## 2023-10-21 NOTE — ED Notes (Signed)
 US  at bedside.

## 2023-10-22 ENCOUNTER — Encounter
Admission: EM | Disposition: A | Discharge status: Home / Self Care | Payer: Self-pay | Source: Home / Self Care | Attending: Internal Medicine

## 2023-10-22 ENCOUNTER — Telehealth (HOSPITAL_COMMUNITY): Payer: Self-pay | Admitting: Pharmacy Technician

## 2023-10-22 ENCOUNTER — Other Ambulatory Visit (HOSPITAL_COMMUNITY): Payer: Self-pay

## 2023-10-22 ENCOUNTER — Inpatient Hospital Stay
Admit: 2023-10-22 | Discharge: 2023-10-22 | Disposition: A | Discharge status: Home / Self Care | Attending: Internal Medicine

## 2023-10-22 DIAGNOSIS — I82432 Acute embolism and thrombosis of left popliteal vein: Secondary | ICD-10-CM | POA: Diagnosis not present

## 2023-10-22 DIAGNOSIS — R0602 Shortness of breath: Secondary | ICD-10-CM

## 2023-10-22 DIAGNOSIS — I2699 Other pulmonary embolism without acute cor pulmonale: Secondary | ICD-10-CM

## 2023-10-22 DIAGNOSIS — I82442 Acute embolism and thrombosis of left tibial vein: Secondary | ICD-10-CM

## 2023-10-22 LAB — CBC WITH DIFFERENTIAL/PLATELET
Abs Immature Granulocytes: 0.07 K/uL (ref 0.00–0.07)
Basophils Absolute: 0.1 K/uL (ref 0.0–0.1)
Basophils Relative: 1 %
Eosinophils Absolute: 0.1 K/uL (ref 0.0–0.5)
Eosinophils Relative: 1 %
HCT: 43.9 % (ref 39.0–52.0)
Hemoglobin: 15.3 g/dL (ref 13.0–17.0)
Immature Granulocytes: 1 %
Lymphocytes Relative: 27 %
Lymphs Abs: 2.6 K/uL (ref 0.7–4.0)
MCH: 30.1 pg (ref 26.0–34.0)
MCHC: 34.9 g/dL (ref 30.0–36.0)
MCV: 86.2 fL (ref 80.0–100.0)
Monocytes Absolute: 0.7 K/uL (ref 0.1–1.0)
Monocytes Relative: 7 %
Neutro Abs: 6.2 K/uL (ref 1.7–7.7)
Neutrophils Relative %: 63 %
Platelets: 136 K/uL — ABNORMAL LOW (ref 150–400)
RBC: 5.09 MIL/uL (ref 4.22–5.81)
RDW: 13.4 % (ref 11.5–15.5)
WBC: 9.7 K/uL (ref 4.0–10.5)
nRBC: 0 % (ref 0.0–0.2)

## 2023-10-22 LAB — BASIC METABOLIC PANEL WITH GFR
Anion gap: 10 (ref 5–15)
BUN: 20 mg/dL (ref 8–23)
CO2: 20 mmol/L — ABNORMAL LOW (ref 22–32)
Calcium: 8.2 mg/dL — ABNORMAL LOW (ref 8.9–10.3)
Chloride: 111 mmol/L (ref 98–111)
Creatinine, Ser: 1.18 mg/dL (ref 0.61–1.24)
GFR, Estimated: >60 mL/min (ref >60)
Glucose, Bld: 90 mg/dL (ref 70–99)
Potassium: 4 mmol/L (ref 3.5–5.1)
Sodium: 141 mmol/L (ref 135–145)

## 2023-10-22 LAB — ECHOCARDIOGRAM COMPLETE
Area-P 1/2: 2.25 cm2
Height: 70 in
S' Lateral: 4.28 cm
Weight: 3264 [oz_av]

## 2023-10-22 LAB — HEPARIN LEVEL (UNFRACTIONATED): Heparin Unfractionated: 0.59 [IU]/mL (ref 0.30–0.70)

## 2023-10-22 LAB — GLUCOSE, CAPILLARY
Glucose-Capillary: 118 mg/dL — ABNORMAL HIGH (ref 70–99)
Glucose-Capillary: 95 mg/dL (ref 70–99)
Glucose-Capillary: 91 mg/dL (ref 70–99)

## 2023-10-22 SURGERY — PULMONARY THROMBECTOMY
Anesthesia: Moderate Sedation | Laterality: Bilateral

## 2023-10-22 MED ORDER — IRBESARTAN 75 MG PO TABS
37.5000 mg | ORAL_TABLET | Freq: Every day | ORAL | Status: DC
  Administered 2023-10-22 – 2023-10-25 (×4): 37.5 mg via ORAL
  Filled 2023-10-22 (×4): qty 0.5

## 2023-10-22 MED ORDER — HYDRALAZINE HCL 20 MG/ML IJ SOLN
10.0000 mg | Freq: Three times a day (TID) | INTRAMUSCULAR | Status: DC | PRN
  Administered 2023-10-22: 10 mg via INTRAVENOUS
  Filled 2023-10-22: qty 1

## 2023-10-22 MED ORDER — PANTOPRAZOLE SODIUM 40 MG IV SOLR
40.0000 mg | Freq: Every day | INTRAVENOUS | Status: DC
  Administered 2023-10-22 – 2023-10-24 (×3): 40 mg via INTRAVENOUS
  Filled 2023-10-22 (×3): qty 10

## 2023-10-22 NOTE — Plan of Care (Signed)

## 2023-10-22 NOTE — Progress Notes (Signed)
 Contact from Telemetry. Run of 150 beats a minute. Approximately 90 seconds. The AM nurse stated that he had a 2 minute run during the day as well.

## 2023-10-22 NOTE — TOC CM/SW Note (Signed)
 Transition of Care [facility]) - Inpatient Brief Assessment   Patient Details  Name: Daniel Mason MRN: [id number] Date of Birth: 08/11/1945  Transition of Care [facility]) CM/SW Contact:    Lauraine JAYSON Carpen, LCSW Phone Number: 10/22/2023, 10:31 AM   Clinical Narrative: CSW reviewed chart. No TOC needs identified so far. CSW will continue to follow progress. Please place [facility] consult if any needs arise.  Transition of Care Asessment: Insurance and Status: Insurance coverage has been reviewed Patient has primary care physician: Yes Home environment has been reviewed: Single family home Prior level of function:: Not documented Prior/Current Home Services: No current home services Social Drivers of Health Review: SDOH reviewed no interventions necessary Readmission risk has been reviewed: Yes Transition of care needs: no transition of care needs at this time

## 2023-10-22 NOTE — Telephone Encounter (Signed)
 Patient Product/process development scientist completed.    The patient is insured through [employer]. Patient has Medicare and is not eligible for a copay card, but may be able to apply for patient assistance or Medicare RX Payment Plan (Patient Must reach out to their plan, if eligible for payment plan), if available.    Ran test claim for Eliquis 5 mg and the current 30 day co-pay is $0.00.  Ran test claim for Xarelto 20 mg and the current 30 day co-pay is $0.00.  This test claim was processed through [facility]- copay amounts may vary at other pharmacies due to pharmacy/plan contracts, or as the patient moves through the different stages of their insurance plan.     Roland Earl, CPHT Pharmacy Technician III Certified Patient Advocate [facility] Patient Advocate Team Direct Number: [phone]  Fax: [phone]

## 2023-10-22 NOTE — Progress Notes (Signed)
 Progress Note   Patient: Daniel Mason FMW:[id number] DOB: December 15, 1945 DOA: 10/20/2023     2 DOS: the patient was seen and examined on 10/22/2023   Brief hospital course: From HPI Daniel Mason is a 78 y.o. male with medical history significant for HTN, Barrett's esophagus and BPH and history of prior PE 6 years ago off [facility] for the past 3 years being admitted with an acute PE.  Patient reports shortness of breath and dyspnea on exertion for the past several days following a trip to [place]  and has noted swelling mostly in his left lower extremity.  He denies chest pain, palpitations, cough, fever or chills.In the ED tachycardic to 110, tachypneic to the mid 20s with O2 sat in the high 90s on room air.  Afebrile. Labs notable for first troponin 32 and BNP 131.  CBC notable for WBC 11,000.  BMP unremarkable except for bicarb of 19. EKG showed sinus tachycardia at 109 with no ischemic changes CTA PE protocol showed extensive acute PE involving right main, lobar and segmental pulmonary arteries and left lower lobar segmental pulmonary arteries without evidence of right heart strain.  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 The ED provider spoke with vascular on-call Dr. Laurence who recommended IV heparin  and consultation with Dr. Bard on Monday for consideration of thrombectomy. Patient started on heparin  bolus and infusion. Admission requested.       Assessment and Plan:   Acute pulmonary embolism (HCC) History of prior PE off AC x 3 years Left-sided DVT of lower extremities Patient with submassive PE without heart strain at this time Echo showing EF 50 to 55% with grade 1 diastolic dysfunction Close hemodynamic monitoring Supplemental O2 if needed Continue heparin  drip Surgery recommends pulmonary thrombectomy however patient  is still here to decide   Moderate persistent asthma without complication Albuterol  as needed   History of Barrett's esophagus No acute issues.  Continue home meds   Hypertension Continue home meds   BPH (benign prostatic hyperplasia) Continue home meds   DVT prophylaxis: Heparin  infusion   Consults: Vascular, Dr. Laurence   Advance Care Planning: full code   Family Communication: Son at bedside   Disposition Plan: Back to previous home environment   Subjective:  Patient admits to improvement in respiratory function Vascular surgeon have recommended pulmonary thrombectomy however patient is yet to finalize his decision   Physical Exam: Vitals and nursing note reviewed.  Constitutional:      General: He is not in acute distress. HENT:     Head: Normocephalic and atraumatic.  Cardiovascular:     Rate and Rhythm: Normal rate and regular rhythm.     Heart sounds: Normal heart sounds.  Pulmonary: Clear to auscultation bilaterally Abdominal:     Palpations: Abdomen is soft.     Tenderness: There is no abdominal tenderness.  Musculoskeletal:     Left lower leg: No tenderness. Edema present.  Neurological:     Mental Status: Mental status is at baseline.         Vitals:   10/22/23 1211 10/22/23 1420 10/22/23 1643 10/22/23 1645  BP: (!) 188/84 (!) 158/88 (!) 171/90   Pulse:  77 79   Resp:  18  (!) 23  Temp:  97.7 F (36.5 C)    TempSrc:      SpO2:  99% 98%   Weight:  Height:          Latest Ref Rng & Units 10/22/2023    3:14 AM 10/20/2023    1:59 PM 11/03/2022    2:29 PM  BMP  Glucose 70 - 99 mg/dL 90  86  872   BUN 8 - 23 mg/dL 20  15  17    Creatinine 0.61 - 1.24 mg/dL 8.81  8.80  8.81   Sodium 135 - 145 mmol/L 141  140  139   Potassium 3.5 - 5.1 mmol/L 4.0  3.7  3.9   Chloride 98 - 111 mmol/L 111  108  109   CO2 22 - 32 mmol/L 20  19  22    Calcium 8.9 - 10.3 mg/dL 8.2  8.7  8.1        Latest Ref Rng & Units 10/22/2023    3:14 AM 10/21/2023    7:16  AM 10/20/2023    1:59 PM  CBC  WBC 4.0 - 10.5 K/uL 9.7  9.8  11.2   Hemoglobin 13.0 - 17.0 g/dL 84.6  84.8  83.8   Hematocrit 39.0 - 52.0 % 43.9  42.6  46.8   Platelets 150 - 400 K/uL 136  166  159      Author: Drue ONEIDA Potter, MD 10/22/2023 5:21 PM  For on call review [email or web address].

## 2023-10-22 NOTE — Progress Notes (Signed)
 Progress Note    10/22/2023 3:46 PM * No surgery found *  Subjective:  Daniel Mason is a 78 yo male   with medical history significant for HTN, Barrett's esophagus and BPH and history of prior PE 6 years ago off AC for the past 3 years being admitted with an acute PE.  Patient reports shortness of breath and dyspnea on exertion for the past several days following a trip to [place]  and has noted swelling mostly in his left lower extremity.   Upon workup patient was noted to have a left lower extremity DVT in his popliteal vein through his tibial veins.  He also underwent CTA with PE protocol showing an extensive acute PE involving the right main, lobar and segmental pulmonary arteries as well as left lower lobar segmental pulmonary arteries.  Patient has been on heparin  for 24 hours now.  He endorses he feels much better and is breathing much better.  He endorses he had more difficulties with breathing on exertion but was okay sitting down.  Patient endorses he played a round of golf this weekend prior to coming to the hospital.  He did notice the shortness of breath but he has is no feeling which creates an asthmatic condition and that he thought this was more related to the.  Vascular surgery was consulted which recommended IV heparin  and possible pulmonary thrombectomy.  No complaints this morning.  No complaints overnight and patient's vitals all remained stable.  Patient's O2 saturation on room air this morning was 96%.   Vitals:   10/22/23 1211 10/22/23 1420  BP: (!) 188/84 (!) 158/88  Pulse:  77  Resp:  18  Temp:  97.7 F (36.5 C)  SpO2:  99%   Physical Exam: Cardiac:  RRR, normal S1 and S2.  No murmurs noted. Lungs: Clear on auscultation throughout but diminished in the bases.  Nonlabored breathing.  No rales rhonchi or wheezing noted today. Incisions: None Extremities: All extremities are warm to touch with palpable pulses.  Left lower extremity swelling seems to have  resolved. Abdomen: Positive bowel sounds throughout, soft, nontender and nondistended. Neurologic: Alert and oriented x 3, answers all questions follows commands appropriately.  CBC    Component Value Date/Time   WBC 9.7 10/22/2023 0314   RBC 5.09 10/22/2023 0314   HGB 15.3 10/22/2023 0314   HCT 43.9 10/22/2023 0314   PLT 136 (L) 10/22/2023 0314   MCV 86.2 10/22/2023 0314   MCH 30.1 10/22/2023 0314   MCHC 34.9 10/22/2023 0314   RDW 13.4 10/22/2023 0314   LYMPHSABS 2.6 10/22/2023 0314   MONOABS 0.7 10/22/2023 0314   EOSABS 0.1 10/22/2023 0314   BASOSABS 0.1 10/22/2023 0314    BMET    Component Value Date/Time   NA 141 10/22/2023 0314   K 4.0 10/22/2023 0314   CL 111 10/22/2023 0314   CO2 20 (L) 10/22/2023 0314   GLUCOSE 90 10/22/2023 0314   BUN 20 10/22/2023 0314   CREATININE 1.18 10/22/2023 0314   CALCIUM 8.2 (L) 10/22/2023 0314   GFRNONAA >60 10/22/2023 0314   GFRAA >60 11/19/2018 1448    INR    Component Value Date/Time   INR 1.0 10/20/2023 2104     Intake/Output Summary (Last 24 hours) at 10/22/2023 1546 Last data filed at 10/22/2023 1400 Gross per 24 hour  Intake 1381.45 ml  Output --  Net 1381.45 ml     Assessment/Plan:  78 y.o. male who presents to [facility] emergency department with  shortness of breath and dyspnea on exertion for the past several days.  Upon workup he was noted to have a DVT to his left lower extremity from popliteal distally through tibial veins.  Patient was also noted to have bilateral pulmonary emboli.* No surgery found *   PLAN After long discussion and some education with the patient and the family at the bedside today which was his wife, daughter and son, it was decided that the patient did not want to undergo a pulmonary thrombectomy this afternoon.  He wishes to stay on heparin  infusion and see how he feels by the end of the day.  If the patient wishes not to undergo procedure heparin  infusion should be discontinued tomorrow and the  patient be started on Eliquis  10 mg twice daily for 7 days then reduce to 5 mg twice daily twice daily.  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  He had this happen 6 years ago in which he spent 3 years post embolism and DVT on Eliquis  anticoagulation.  It was stopped after 3 years because he felt he did not need it.  He has now had a second DVT with pulmonary embolisms and therefore will require oral anticoagulation for the rest of his life as long as he can tolerate it.  Patient and family members were advised of this.  They verbalized her understanding.  DVT prophylaxis: Heparin  infusion.   [name] [facility] 10/22/2023 3:46 PM

## 2023-10-22 NOTE — Consult Note (Signed)
 Pharmacy Consult Note - Anticoagulation  Pharmacy Consult for heparin  Indication: pulmonary embolus  PATIENT MEASUREMENTS: Height: 5' 10 (177.8 cm) Weight: 92.5 kg (204 lb) IBW/kg (Calculated) : 73 HEPARIN  DW (KG): 91.6  VITAL SIGNS: Temp: 98 F (36.7 C) (09/15 0058) Temp Source: Oral (09/14 1655) BP: 173/73 (09/15 0058) Pulse Rate: 53 (09/15 0058)  Recent Labs    10/20/23 1359 10/20/23 1613 10/20/23 2104 10/21/23 0716 10/21/23 1700 10/22/23 0035  HGB 16.1  --   --  15.1  --   --   HCT 46.8  --   --  42.6  --   --   PLT 159  --   --  166  --   --   APTT  --   --  34  --   --   --   LABPROT  --   --  14.0  --   --   --   INR  --   --  1.0  --   --   --   HEPARINUNFRC  --   --   --  0.95*   < > 0.59  CREATININE 1.19  --   --   --   --   --   TROPONINIHS 27* 32*  --   --   --   --    < > = values in this interval not displayed.    Estimated Creatinine Clearance: 59.4 mL/min (by C-G formula based on SCr of 1.19 mg/dL).  PAST MEDICAL HISTORY: Past Medical History:  Diagnosis Date   Allergy    Arthritis    Bladder tumor    Chronic bilateral low back pain with bilateral sciatica    Chronic cough    DVT (deep venous thrombosis) (HCC) 04/2017   was on blood thinners until hematuria.  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 male with PMH including HTN, BPH, thrombocytopenia, provoked DVT no longer on Eliquis  is presenting with pulmonary embolism (PE). CT chest shows extensive PE involving right main, lobar, and subsegmental pulmonary arteries as well as LLL segmental pulmonary arteries. PLT are within normal limits. Patient is not on chronic anticoagulation per chart review. Pharmacy has been consulted to initiate and manage heparin  intravenous infusion.  Pertinent medications:  No chronic anticoagulation per chart review Aspirin 81mg   Goal(s) of therapy: Heparin  level 0.3 - 0.7 units/mL Monitor platelets by anticoagulation protocol: Yes   Baseline anticoagulation labs: Recent Labs    10/20/23 1359 10/20/23 2104 10/21/23 0716  APTT  --  34  --   INR  --  1.0  --   HGB 16.1  --  15.1  PLT 159  --  166   Baseline aPTT, INR ordered  Date Time HL Rate/Comment 09/14 0716 0.95 SUPRAtherapeutic@1500  units/hr 09/14 1700 0.63 Therapeutic x 1 09/15   0035   0.59     Therapeutic X 2   PLAN: HL therapeutic x 2 Continue heparin  infusion at 1300 units/hour. Recheck HL on 9/16 with AM labs  Monitor CBC daily while on heparin  infusion.   Silver Selinda BIRCH, PharmD Clinical Pharmacist 10/22/2023 1:31 AM

## 2023-10-23 DIAGNOSIS — I82432 Acute embolism and thrombosis of left popliteal vein: Secondary | ICD-10-CM | POA: Diagnosis not present

## 2023-10-23 DIAGNOSIS — I82442 Acute embolism and thrombosis of left tibial vein: Secondary | ICD-10-CM | POA: Diagnosis not present

## 2023-10-23 DIAGNOSIS — I2699 Other pulmonary embolism without acute cor pulmonale: Secondary | ICD-10-CM | POA: Diagnosis not present

## 2023-10-23 LAB — CBC WITH DIFFERENTIAL/PLATELET
Abs Immature Granulocytes: 0.04 K/uL (ref 0.00–0.07)
Basophils Absolute: 0.1 K/uL (ref 0.0–0.1)
Basophils Relative: 1 %
Eosinophils Absolute: 0.1 K/uL (ref 0.0–0.5)
Eosinophils Relative: 1 %
HCT: 47.3 % (ref 39.0–52.0)
Hemoglobin: 16.8 g/dL (ref 13.0–17.0)
Immature Granulocytes: 1 %
Lymphocytes Relative: 26 %
Lymphs Abs: 2.1 K/uL (ref 0.7–4.0)
MCH: 30.2 pg (ref 26.0–34.0)
MCHC: 35.5 g/dL (ref 30.0–36.0)
MCV: 84.9 fL (ref 80.0–100.0)
Monocytes Absolute: 0.6 K/uL (ref 0.1–1.0)
Monocytes Relative: 8 %
Neutro Abs: 5.2 K/uL (ref 1.7–7.7)
Neutrophils Relative %: 63 %
Platelets: 136 K/uL — ABNORMAL LOW (ref 150–400)
RBC: 5.57 MIL/uL (ref 4.22–5.81)
RDW: 13.2 % (ref 11.5–15.5)
WBC: 8.1 K/uL (ref 4.0–10.5)
nRBC: 0 % (ref 0.0–0.2)

## 2023-10-23 LAB — BASIC METABOLIC PANEL WITH GFR
Anion gap: 8 (ref 5–15)
BUN: 18 mg/dL (ref 8–23)
CO2: 22 mmol/L (ref 22–32)
Calcium: 8.5 mg/dL — ABNORMAL LOW (ref 8.9–10.3)
Chloride: 110 mmol/L (ref 98–111)
Creatinine, Ser: 1.02 mg/dL (ref 0.61–1.24)
GFR, Estimated: >60 mL/min (ref >60)
Glucose, Bld: 104 mg/dL — ABNORMAL HIGH (ref 70–99)
Potassium: 3.9 mmol/L (ref 3.5–5.1)
Sodium: 140 mmol/L (ref 135–145)

## 2023-10-23 LAB — HEPARIN LEVEL (UNFRACTIONATED): Heparin Unfractionated: 0.47 [IU]/mL (ref 0.30–0.70)

## 2023-10-23 LAB — MAGNESIUM: Magnesium: 2 mg/dL (ref 1.7–2.4)

## 2023-10-23 LAB — GLUCOSE, CAPILLARY: Glucose-Capillary: 106 mg/dL — ABNORMAL HIGH (ref 70–99)

## 2023-10-23 NOTE — Consult Note (Signed)
 Pharmacy Consult Note - Anticoagulation  Pharmacy Consult for heparin  Indication: pulmonary embolus  PATIENT MEASUREMENTS: Height: 5' 10 (177.8 cm) Weight: 92.3 kg (203 lb 7.8 oz) IBW/kg (Calculated) : 73 HEPARIN  DW (KG): 91.6  VITAL SIGNS: Temp: 97.6 F (36.4 C) (09/15 2000) BP: 148/82 (09/15 2000) Pulse Rate: 84 (09/15 2000)  Recent Labs    10/20/23 1613 10/20/23 2104 10/21/23 0716 10/22/23 0314 10/23/23 0619  HGB  --   --    < > 15.3 16.8  HCT  --   --    < > 43.9 47.3  PLT  --   --    < > 136* 136*  APTT  --  34  --   --   --   LABPROT  --  14.0  --   --   --   INR  --  1.0  --   --   --   HEPARINUNFRC  --   --    < >  --  0.47  CREATININE  --   --   --  1.18  --   TROPONINIHS 32*  --   --   --   --    < > = values in this interval not displayed.    Estimated Creatinine Clearance: 59.8 mL/min (by C-G formula based on SCr of 1.18 mg/dL).  PAST MEDICAL HISTORY: Past Medical History:  Diagnosis Date   Allergy    Arthritis    Bladder tumor    Chronic bilateral low back pain with bilateral sciatica    Chronic cough    DVT (deep venous thrombosis) (HCC) 04/2017   was on blood thinners until hematuria.  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 male with PMH including HTN, BPH,  thrombocytopenia, provoked DVT no longer on Eliquis  is presenting with pulmonary embolism (PE). CT chest shows extensive PE involving right main, lobar, and subsegmental pulmonary arteries as well as LLL segmental pulmonary arteries. PLT are within normal limits. Patient is not on chronic anticoagulation per chart review. Pharmacy has been consulted to initiate and manage heparin  intravenous infusion.  Pertinent medications:  No chronic anticoagulation per chart review Aspirin 81mg   Goal(s) of therapy: Heparin  level 0.3 - 0.7 units/mL Monitor platelets by anticoagulation protocol: Yes   Baseline anticoagulation labs: Recent Labs    10/20/23 2104 10/21/23 0716 10/22/23 0314 10/23/23 0619  APTT 34  --   --   --   INR 1.0  --   --   --   HGB  --  15.1 15.3 16.8  PLT  --  166 136* 136*   Baseline aPTT, INR ordered  Date Time HL Rate/Comment  09/14 0716 0.95 SUPRAtherapeutic@1500  units/hr 09/14 1700 0.63 Therapeutic x 1 09/15   0035   0.59     Therapeutic X 2 09/16 0619 0.47 Therapeutic x 3  PLAN: HL therapeutic x 3 Continue heparin  infusion at 1300 units/hour. Recheck HL on 9/17 with AM labs  Monitor CBC daily while on heparin  infusion.  Rankin CANDIE Dills, PharmD, [facility] 10/23/2023 7:08 AM

## 2023-10-23 NOTE — Plan of Care (Signed)

## 2023-10-23 NOTE — Care Management Important Message (Signed)
 Important Message  Patient Details  Name: Daniel Mason MRN: [id number] Date of Birth: 07-18-1945   Important Message Given:  Yes - Medicare IM     Daniel Mason 10/23/2023, 11:15 AM

## 2023-10-23 NOTE — H&P (View-Only) (Signed)
 Progress Note    10/23/2023 4:20 PM * No surgery found *  Subjective:  Daniel Mason is a 78 yo male   with medical history significant for HTN, Barrett's esophagus and BPH and history of prior PE 6 years ago off AC for the past 3 years being admitted with an acute PE.  Patient reports shortness of breath and dyspnea on exertion for the past several days following a trip to [place]  and has noted swelling mostly in his left lower extremity.    Upon workup patient was noted to have a left lower extremity DVT in his popliteal vein through his tibial veins.  He also underwent CTA with PE protocol showing an extensive acute PE involving the right main, lobar and segmental pulmonary arteries as well as left lower lobar segmental pulmonary arteries.  Patient has been on heparin  for 24 hours now.  He endorses he feels much better and is breathing much better.  He endorses he had more difficulties with breathing on exertion but was okay sitting down.  Patient endorses he played a round of golf this weekend prior to coming to the hospital.  He did notice the shortness of breath but he has is no feeling which creates an asthmatic condition and that he thought this was more related to the.  Vascular surgery was consulted which recommended IV heparin  and possible pulmonary thrombectomy.   No complaints this morning.  No complaints overnight and patient's vitals all remained stable.  Patient's O2 saturation on room air this morning was 96%.   Vitals:   10/23/23 1200 10/23/23 1515  BP: (!) 152/89 (!) 134/104  Pulse: 76 97  Resp:    Temp: 97.9 F (36.6 C) 97.8 F (36.6 C)  SpO2: 97% 99%   Physical Exam: Cardiac:  RRR, normal S1 and S2.  No murmurs noted. Lungs: Clear on auscultation throughout but diminished in the bases.  Nonlabored breathing.  No rales rhonchi or wheezing noted today. Incisions: None Extremities: All extremities are warm to touch with palpable pulses.  Left lower extremity  swelling seems to have resolved. Abdomen: Positive bowel sounds throughout, soft, nontender and nondistended. Neurologic: Alert and oriented x 3, answers all questions follows commands appropriately.  CBC    Component Value Date/Time   WBC 8.1 10/23/2023 0619   RBC 5.57 10/23/2023 0619   HGB 16.8 10/23/2023 0619   HCT 47.3 10/23/2023 0619   PLT 136 (L) 10/23/2023 0619   MCV 84.9 10/23/2023 0619   MCH 30.2 10/23/2023 0619   MCHC 35.5 10/23/2023 0619   RDW 13.2 10/23/2023 0619   LYMPHSABS 2.1 10/23/2023 0619   MONOABS 0.6 10/23/2023 0619   EOSABS 0.1 10/23/2023 0619   BASOSABS 0.1 10/23/2023 0619    BMET    Component Value Date/Time   NA 140 10/23/2023 0619   K 3.9 10/23/2023 0619   CL 110 10/23/2023 0619   CO2 22 10/23/2023 0619   GLUCOSE 104 (H) 10/23/2023 0619   BUN 18 10/23/2023 0619   CREATININE 1.02 10/23/2023 0619   CALCIUM 8.5 (L) 10/23/2023 0619   GFRNONAA >60 10/23/2023 0619   GFRAA >60 11/19/2018 1448    INR    Component Value Date/Time   INR 1.0 10/20/2023 2104     Intake/Output Summary (Last 24 hours) at 10/23/2023 1620 Last data filed at 10/23/2023 1100 Gross per 24 hour  Intake 1372.88 ml  Output --  Net 1372.88 ml     Assessment/Plan: 78 y.o. male who presents to  [facility] emergency department with shortness of breath and dyspnea on exertion for the past several days.  Upon workup he was noted to have a DVT to his left lower extremity from popliteal distally through tibial veins.  Patient was also noted to have bilateral pulmonary emboli.  * No surgery found *   PLAN Patient had a discussion with his family today and decided that he would like to proceed with having a pulmonary thrombectomy after being on heparin  24 hours.  Therefore vascular surgery will plan on taking the patient to the vascular lab tomorrow on 10/24/2023 for pulmonary thrombectomy.  I discussed in detail with the patient and family the procedure, benefits, risk, and complications.   Patient verbalized understanding wishes to proceed.  Answered all her questions.  Patient will be made n.p.o. after midnight for procedure tomorrow.  Patient will continue on a heparin  infusion going to preop prior to the procedure.  All the patient's lab work is normal.  I discussed the case in detail with Dr. Selinda Gu MD and he agrees with the plan.  DVT prophylaxis: Heparin  infusion   [name] 10/23/2023 4:20 PM

## 2023-10-23 NOTE — Progress Notes (Signed)
 Progress Note    10/23/2023 4:20 PM * No surgery found *  Subjective:  Daniel Mason is a 78 yo male   with medical history significant for HTN, Barrett's esophagus and BPH and history of prior PE 6 years ago off AC for the past 3 years being admitted with an acute PE.  Patient reports shortness of breath and dyspnea on exertion for the past several days following a trip to [place]  and has noted swelling mostly in his left lower extremity.    Upon workup patient was noted to have a left lower extremity DVT in his popliteal vein through his tibial veins.  He also underwent CTA with PE protocol showing an extensive acute PE involving the right main, lobar and segmental pulmonary arteries as well as left lower lobar segmental pulmonary arteries.  Patient has been on heparin  for 24 hours now.  He endorses he feels much better and is breathing much better.  He endorses he had more difficulties with breathing on exertion but was okay sitting down.  Patient endorses he played a round of golf this weekend prior to coming to the hospital.  He did notice the shortness of breath but he has is no feeling which creates an asthmatic condition and that he thought this was more related to the.  Vascular surgery was consulted which recommended IV heparin  and possible pulmonary thrombectomy.   No complaints this morning.  No complaints overnight and patient's vitals all remained stable.  Patient's O2 saturation on room air this morning was 96%.   Vitals:   10/23/23 1200 10/23/23 1515  BP: (!) 152/89 (!) 134/104  Pulse: 76 97  Resp:    Temp: 97.9 F (36.6 C) 97.8 F (36.6 C)  SpO2: 97% 99%   Physical Exam: Cardiac:  RRR, normal S1 and S2.  No murmurs noted. Lungs: Clear on auscultation throughout but diminished in the bases.  Nonlabored breathing.  No rales rhonchi or wheezing noted today. Incisions: None Extremities: All extremities are warm to touch with palpable pulses.  Left lower extremity  swelling seems to have resolved. Abdomen: Positive bowel sounds throughout, soft, nontender and nondistended. Neurologic: Alert and oriented x 3, answers all questions follows commands appropriately.  CBC    Component Value Date/Time   WBC 8.1 10/23/2023 0619   RBC 5.57 10/23/2023 0619   HGB 16.8 10/23/2023 0619   HCT 47.3 10/23/2023 0619   PLT 136 (L) 10/23/2023 0619   MCV 84.9 10/23/2023 0619   MCH 30.2 10/23/2023 0619   MCHC 35.5 10/23/2023 0619   RDW 13.2 10/23/2023 0619   LYMPHSABS 2.1 10/23/2023 0619   MONOABS 0.6 10/23/2023 0619   EOSABS 0.1 10/23/2023 0619   BASOSABS 0.1 10/23/2023 0619    BMET    Component Value Date/Time   NA 140 10/23/2023 0619   K 3.9 10/23/2023 0619   CL 110 10/23/2023 0619   CO2 22 10/23/2023 0619   GLUCOSE 104 (H) 10/23/2023 0619   BUN 18 10/23/2023 0619   CREATININE 1.02 10/23/2023 0619   CALCIUM 8.5 (L) 10/23/2023 0619   GFRNONAA >60 10/23/2023 0619   GFRAA >60 11/19/2018 1448    INR    Component Value Date/Time   INR 1.0 10/20/2023 2104     Intake/Output Summary (Last 24 hours) at 10/23/2023 1620 Last data filed at 10/23/2023 1100 Gross per 24 hour  Intake 1372.88 ml  Output --  Net 1372.88 ml     Assessment/Plan: 78 y.o. male who presents to  [facility] emergency department with shortness of breath and dyspnea on exertion for the past several days.  Upon workup he was noted to have a DVT to his left lower extremity from popliteal distally through tibial veins.  Patient was also noted to have bilateral pulmonary emboli.  * No surgery found *   PLAN Patient had a discussion with his family today and decided that he would like to proceed with having a pulmonary thrombectomy after being on heparin  24 hours.  Therefore vascular surgery will plan on taking the patient to the vascular lab tomorrow on 10/24/2023 for pulmonary thrombectomy.  I discussed in detail with the patient and family the procedure, benefits, risk, and complications.   Patient verbalized understanding wishes to proceed.  Answered all her questions.  Patient will be made n.p.o. after midnight for procedure tomorrow.  Patient will continue on a heparin  infusion going to preop prior to the procedure.  All the patient's lab work is normal.  I discussed the case in detail with Dr. Selinda Gu MD and he agrees with the plan.  DVT prophylaxis: Heparin  infusion   [name] [facility] 10/23/2023 4:20 PM

## 2023-10-23 NOTE — Progress Notes (Signed)
 Progress Note   Patient: Daniel Mason FMW:[id number] DOB: [birth year] DOA: 10/20/2023     3 DOS: the patient was seen and examined on 10/23/2023      Brief hospital course: From HPI Daniel Mason is a 78 y.o. male with medical history significant for HTN, Barrett's esophagus and BPH and history of prior PE 6 years ago off [facility] for the past 3 years being admitted with an acute PE.  Patient reports shortness of breath and dyspnea on exertion for the past several days following a trip to [place]  and has noted swelling mostly in his left lower extremity.  He denies chest pain, palpitations, cough, fever or chills.In the ED tachycardic to 110, tachypneic to the mid 20s with O2 sat in the high 90s on room air.  Afebrile. Labs notable for first troponin 32 and BNP 131.  CBC notable for WBC 11,000.  BMP unremarkable except for bicarb of 19. EKG showed sinus tachycardia at 109 with no ischemic changes CTA PE protocol showed extensive acute PE involving right main, lobar and segmental pulmonary arteries and left lower lobar segmental pulmonary arteries without evidence of right heart strain.  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 The ED provider spoke with vascular on-call Dr. Laurence who recommended IV heparin  and consultation with Dr. Bard on Monday for consideration of thrombectomy. Patient started on heparin  bolus and infusion. Admission requested.       Assessment and Plan:   Acute pulmonary embolism (HCC) History of prior PE off AC x 3 years Left-sided DVT of lower extremities Patient with submassive PE without heart strain at this time Echo showing EF 50 to 55% with grade 1 diastolic dysfunction Close hemodynamic monitoring Supplemental O2 if needed Continue heparin  drip Surgery recommends pulmonary thrombectomy  Patient  has made decision to proceed hopefully tomorrow  Moderate persistent asthma without complication Albuterol  as needed   History of Barrett's esophagus No acute issues.  Continue home meds   Hypertension Continue home meds   BPH (benign prostatic hyperplasia) Continue home meds   DVT prophylaxis: Heparin  infusion   Consults: Vascular surgery   Advance Care Planning: full code   Family Communication: Son at bedside   Disposition Plan: Back to previous home environment   Subjective:  Patient admits to improvement in respiratory function Vascular surgeon have recommended pulmonary thrombectomy patient has now finalized his decision to proceed with surgery Vascular surgeon planning   Physical Exam: Vitals and nursing note reviewed.  Constitutional:      General: He is not in acute distress. HENT:     Head: Normocephalic and atraumatic.  Cardiovascular:     Rate and Rhythm: Normal rate and regular rhythm.     Heart sounds: Normal heart sounds.  Pulmonary: Clear to auscultation bilaterally Abdominal:     Palpations: Abdomen is soft.     Tenderness: There is no abdominal tenderness.  Musculoskeletal:     Left lower leg: No tenderness. Edema present.  Neurological:     Mental Status: Mental status is at baseline.     Vitals:   10/23/23 0500 10/23/23 0808 10/23/23 1200 10/23/23 1515  BP:  (!) 146/98 (!) 152/89 (!) 134/104  Pulse:  86 76 97  Resp:      Temp:  97.8 F (36.6 C) 97.9 F (36.6 C) 97.8 F (36.6 C)  TempSrc:  Oral Oral  SpO2:  95% 97% 99%  Weight: 92.3 kg     Height:          Latest Ref Rng & Units 10/23/2023    6:19 AM 10/22/2023    3:14 AM 10/20/2023    1:59 PM  BMP  Glucose 70 - 99 mg/dL 895  90  86   BUN 8 - 23 mg/dL 18  20  15    Creatinine 0.61 - 1.24 mg/dL 8.97  8.81  8.80   Sodium 135 - 145 mmol/L 140  141  140   Potassium 3.5 - 5.1 mmol/L 3.9  4.0  3.7   Chloride 98 - 111 mmol/L 110  111  108   CO2 22 - 32 mmol/L 22  20  19    Calcium 8.9  - 10.3 mg/dL 8.5  8.2  8.7        Latest Ref Rng & Units 10/23/2023    6:19 AM 10/22/2023    3:14 AM 10/21/2023    7:16 AM  CBC  WBC 4.0 - 10.5 K/uL 8.1  9.7  9.8   Hemoglobin 13.0 - 17.0 g/dL 83.1  84.6  84.8   Hematocrit 39.0 - 52.0 % 47.3  43.9  42.6   Platelets 150 - 400 K/uL 136  136  166      Author: Drue ONEIDA Potter, MD 10/23/2023 5:56 PM  For on call review [email or web address].

## 2023-10-24 ENCOUNTER — Encounter
Admission: EM | Disposition: A | Discharge status: Home / Self Care | Payer: Self-pay | Source: Home / Self Care | Attending: Internal Medicine

## 2023-10-24 DIAGNOSIS — I82432 Acute embolism and thrombosis of left popliteal vein: Secondary | ICD-10-CM

## 2023-10-24 DIAGNOSIS — N401 Enlarged prostate with lower urinary tract symptoms: Secondary | ICD-10-CM

## 2023-10-24 DIAGNOSIS — I10 Essential (primary) hypertension: Secondary | ICD-10-CM | POA: Diagnosis not present

## 2023-10-24 DIAGNOSIS — I2699 Other pulmonary embolism without acute cor pulmonale: Secondary | ICD-10-CM | POA: Diagnosis not present

## 2023-10-24 LAB — CBC WITH DIFFERENTIAL/PLATELET
Abs Immature Granulocytes: 0.06 K/uL (ref 0.00–0.07)
Basophils Absolute: 0.1 K/uL (ref 0.0–0.1)
Basophils Relative: 1 %
Eosinophils Absolute: 0.1 K/uL (ref 0.0–0.5)
Eosinophils Relative: 1 %
HCT: 46.8 % (ref 39.0–52.0)
Hemoglobin: 15.9 g/dL (ref 13.0–17.0)
Immature Granulocytes: 1 %
Lymphocytes Relative: 29 %
Lymphs Abs: 2.4 K/uL (ref 0.7–4.0)
MCH: 29.4 pg (ref 26.0–34.0)
MCHC: 34 g/dL (ref 30.0–36.0)
MCV: 86.5 fL (ref 80.0–100.0)
Monocytes Absolute: 0.6 K/uL (ref 0.1–1.0)
Monocytes Relative: 7 %
Neutro Abs: 5.1 K/uL (ref 1.7–7.7)
Neutrophils Relative %: 61 %
Platelets: 158 K/uL (ref 150–400)
RBC: 5.41 MIL/uL (ref 4.22–5.81)
RDW: 13.2 % (ref 11.5–15.5)
WBC: 8.4 K/uL (ref 4.0–10.5)
nRBC: 0 % (ref 0.0–0.2)

## 2023-10-24 LAB — BASIC METABOLIC PANEL WITH GFR
Anion gap: 9 (ref 5–15)
BUN: 17 mg/dL (ref 8–23)
CO2: 24 mmol/L (ref 22–32)
Calcium: 8.5 mg/dL — ABNORMAL LOW (ref 8.9–10.3)
Chloride: 107 mmol/L (ref 98–111)
Creatinine, Ser: 1.13 mg/dL (ref 0.61–1.24)
GFR, Estimated: >60 mL/min (ref >60)
Glucose, Bld: 105 mg/dL — ABNORMAL HIGH (ref 70–99)
Potassium: 3.5 mmol/L (ref 3.5–5.1)
Sodium: 140 mmol/L (ref 135–145)

## 2023-10-24 LAB — HEPARIN LEVEL (UNFRACTIONATED): Heparin Unfractionated: 0.38 [IU]/mL (ref 0.30–0.70)

## 2023-10-24 LAB — GLUCOSE, CAPILLARY: Glucose-Capillary: 109 mg/dL — ABNORMAL HIGH (ref 70–99)

## 2023-10-24 SURGERY — PULMONARY THROMBECTOMY
Anesthesia: Moderate Sedation | Laterality: Bilateral

## 2023-10-24 MED ORDER — SODIUM CHLORIDE 0.9 % IV SOLN: INTRAVENOUS | Status: DC

## 2023-10-24 MED ORDER — METHYLPREDNISOLONE SODIUM SUCC 125 MG IJ SOLR
INTRAMUSCULAR | Status: AC
  Filled 2023-10-24: qty 2

## 2023-10-24 MED ORDER — CEFAZOLIN SODIUM-DEXTROSE 2-4 GM/100ML-% IV SOLN
2.0000 g | INTRAVENOUS | Status: AC
Start: 2023-10-24 — End: 2023-10-24
  Administered 2023-10-24: 2 g via INTRAVENOUS

## 2023-10-24 MED ORDER — CEFAZOLIN SODIUM-DEXTROSE 2-4 GM/100ML-% IV SOLN
INTRAVENOUS | Status: AC
Start: 2023-10-24 — End: 2023-10-24
  Filled 2023-10-24: qty 100

## 2023-10-24 MED ORDER — MIDAZOLAM HCL 2 MG/2ML IJ SOLN
INTRAMUSCULAR | Status: AC
  Filled 2023-10-24: qty 2

## 2023-10-24 MED ORDER — FENTANYL CITRATE (PF) 100 MCG/2ML IJ SOLN
INTRAMUSCULAR | Status: AC
  Filled 2023-10-24: qty 2

## 2023-10-24 MED ORDER — DIPHENHYDRAMINE HCL 50 MG/ML IJ SOLN
INTRAMUSCULAR | Status: AC
Start: 2023-10-24 — End: 2023-10-24
  Filled 2023-10-24: qty 1

## 2023-10-24 MED ORDER — IODIXANOL 320 MG/ML IV SOLN
INTRAVENOUS | Status: DC | PRN
  Administered 2023-10-24: 30 mL via INTRAVENOUS

## 2023-10-24 MED ORDER — HEPARIN SODIUM (PORCINE) 1000 UNIT/ML IJ SOLN
INTRAMUSCULAR | Status: DC | PRN
  Administered 2023-10-24: 3000 [IU] via INTRAVENOUS

## 2023-10-24 MED ORDER — FAMOTIDINE 20 MG PO TABS
ORAL_TABLET | ORAL | Status: AC
Start: 2023-10-24 — End: 2023-10-24
  Filled 2023-10-24: qty 2

## 2023-10-24 MED ORDER — MIDAZOLAM HCL 2 MG/2ML IJ SOLN
INTRAMUSCULAR | Status: DC | PRN
  Administered 2023-10-24 (×2): 1 mg via INTRAVENOUS

## 2023-10-24 MED ORDER — MIDAZOLAM HCL 2 MG/ML PO SYRP: 8.0000 mg | ORAL_SOLUTION | Freq: Once | ORAL | Status: DC | PRN

## 2023-10-24 MED ORDER — HEPARIN SODIUM (PORCINE) 1000 UNIT/ML IJ SOLN
INTRAMUSCULAR | Status: AC
Start: 2023-10-24 — End: 2023-10-24
  Filled 2023-10-24: qty 10

## 2023-10-24 MED ORDER — METHYLPREDNISOLONE SODIUM SUCC 125 MG IJ SOLR
125.0000 mg | Freq: Once | INTRAMUSCULAR | Status: AC | PRN
  Administered 2023-10-24: 125 mg via INTRAVENOUS

## 2023-10-24 MED ORDER — DIPHENHYDRAMINE HCL 25 MG PO CAPS
ORAL_CAPSULE | ORAL | Status: AC
  Filled 2023-10-24: qty 2

## 2023-10-24 MED ORDER — FAMOTIDINE 20 MG PO TABS
40.0000 mg | ORAL_TABLET | Freq: Once | ORAL | Status: AC | PRN
  Administered 2023-10-24: 40 mg via ORAL

## 2023-10-24 MED ORDER — HEPARIN (PORCINE) IN NACL 1000-0.9 UT/500ML-% IV SOLN
INTRAVENOUS | Status: DC | PRN
  Administered 2023-10-24: 1000 mL

## 2023-10-24 MED ORDER — LIDOCAINE-EPINEPHRINE (PF) 1 %-1:200000 IJ SOLN
INTRAMUSCULAR | Status: DC | PRN
  Administered 2023-10-24: 10 mL via INTRADERMAL

## 2023-10-24 MED ORDER — FENTANYL CITRATE (PF) 100 MCG/2ML IJ SOLN
INTRAMUSCULAR | Status: DC | PRN
  Administered 2023-10-24: 25 ug via INTRAVENOUS
  Administered 2023-10-24: 50 ug via INTRAVENOUS

## 2023-10-24 MED ORDER — DIPHENHYDRAMINE HCL 50 MG/ML IJ SOLN
50.0000 mg | Freq: Once | INTRAMUSCULAR | Status: AC | PRN
  Administered 2023-10-24: 50 mg via INTRAVENOUS

## 2023-10-24 SURGICAL SUPPLY — 17 items
CANISTER PENUMBRA ENGINE (MISCELLANEOUS) IMPLANT
CATH ANGIO 5F 100CM .035 PIG (CATHETERS) IMPLANT
CATH INDIGO 12XTORQ 100 (CATHETERS) IMPLANT
CATH INDIGO SEP 12 (CATHETERS) IMPLANT
CATH LIGHTNING 8 XTORQ 115 (CATHETERS) IMPLANT
CATH SELECT BERN TIP 5F 130 (CATHETERS) IMPLANT
CLOSURE PERCLOSE PROSTYLE (VASCULAR PRODUCTS) IMPLANT
COVER PROBE ULTRASOUND 5X96 (MISCELLANEOUS) IMPLANT
GLIDEWIRE ADV .035X180CM (WIRE) IMPLANT
PACK ANGIOGRAPHY (CUSTOM PROCEDURE TRAY) ×1 IMPLANT
SHEATH BRITE TIP 6FRX11 (SHEATH) IMPLANT
SHEATH CHCK-FLO 14FR 13 (SHEATH) IMPLANT
SUT MNCRL AB 4-0 PS2 18 (SUTURE) IMPLANT
SYR MEDRAD MARK 7 150ML (SYRINGE) IMPLANT
TUBING CONTRAST HIGH PRESS 72 (TUBING) IMPLANT
WIRE J 3MM .035X145CM (WIRE) IMPLANT
WIRE SUPRACORE 300CM (WIRE) IMPLANT

## 2023-10-24 NOTE — Interval H&P Note (Signed)
 History and Physical Interval Note:  10/24/2023 1:04 PM  Daniel Mason  has presented today for surgery, with the diagnosis of Pulmonary Embolism.  The various methods of treatment have been discussed with the patient and family. After consideration of risks, benefits and other options for treatment, the patient has consented to  Procedure(s): PULMONARY THROMBECTOMY (Bilateral) as a surgical intervention.  The patient's history has been reviewed, patient examined, no change in status, stable for surgery.  I have reviewed the patient's chart and labs.  Questions were answered to the patient's satisfaction.     Jahvier Aldea

## 2023-10-24 NOTE — Plan of Care (Signed)

## 2023-10-24 NOTE — Op Note (Signed)
 [facility]  Percutaneous Study/Intervention Procedural Note   Date of Surgery: 10/24/2023,1:44 PM  Surgeon: Selinda Gu  Pre-operative Diagnosis: Symptomatic bilateral pulmonary emboli  Post-operative diagnosis:  Same  Procedure(s) Performed:  1.  Contrast injection right heart  2.  Mechanical thrombectomy using the penumbra CAT 12 catheter to the right lower lobe, middle lobe, and upper lobe pulmonary arteries into the left lower and upper lobe pulmonary arteries  3.  Selective catheter placement left lower lobe and left upper lobe pulmonary artery  4.  Selective catheter placement right lower lobe, middle lobe, and upper lobe pulmonary artery      Anesthesia: Conscious sedation was administered under my direct supervision by the interventional radiology RN. IV Versed  plus fentanyl  were utilized. Continuous ECG, pulse oximetry and blood pressure was monitored throughout the entire procedure.  Versed  and fentanyl  were administered intravenously.  Conscious sedation was administered for a total of 44 minutes using 2 mg of Versed  and 75 mcg of Fentanyl .  EBL: 350 cc  Sheath: 14 French right femoral vein  Contrast: 30 cc   Fluoroscopy Time: 9.7 minutes  Indications:  Patient presents with pulmonary emboli. The patient is symptomatic with hypoxemia and dyspnea on exertion.  The patient is otherwise a good candidate for intervention and even the long-term benefits pulmonary angiography with thrombectomy is offered. The risks and benefits are reviewed long-term benefits are discussed. All questions are answered patient agrees to proceed.  Procedure:  Daniel Mason a 78 y.o. male who was identified and appropriate procedural time out was performed.  The patient was then placed supine on the table and prepped and draped in the usual sterile fashion.  Ultrasound was used to evaluate the right common femoral vein.  It was patent, as it was  echolucent and compressible.  A digital ultrasound image was acquired for the permanent record.  A Seldinger needle was used to access the right common femoral vein under direct ultrasound guidance.  A 0.035 J wire was advanced without resistance and a 6Fr sheath was placed. A proglide device was placed in a preclose fashion and then upsized to a 14 [place] sheath.    The Advantage wire and pigtail catheter were then negotiated into the right atrium and bolus injection of contrast was utilized to demonstrate the right ventricle and the pulmonary artery outflow. The Advantage wire and catheter were then negotiated into the the pulmonary arteries.  The left pulmonary artery was cannulated and advanced into the left upper lobe and left lower lobe for selective imaging. This demonstrated a relatively small amount of thrombus within the left lower lobe and left upper lobe pulmonary artery.  I then transitioned to the right side with the advantage wire and catheter and first cannulated the right lower lobe and then the right middle and upper lobes.  This demonstrated extensive thrombosis of the right main pulmonary artery as well as the primary lower lobe, middle lobe, and upper lobe branches of the pulmonary artery.  3000 Units of heparin  was then given and allowed to circulate.   The Penumbra Cat 12 catheter was then advanced up into the pulmonary vasculature. The right lung was addressed first. Catheter was negotiated into the right lower lobe pulmonary artery and mechanical thrombectomy was performed with the help of the separator. Follow-up imaging demonstrated a good result and therefore the catheter was renegotiated into the right middle lobe pulmonary artery and again mechanical thrombectomy was performed. Passes were made with both the Penumbra catheter  itself as well as introducing the separator.  Finally, I was able to get the penumbra CAT 12 catheter and the separator into the right upper lobe pulmonary  artery and perform mechanical thrombectomy there as well.  Follow-up imaging was then performed.  Marked improvement was seen on the right side.  The Penumbra Cat 12 catheter was then negotiated to the opposite side. The left lung was then addressed. Catheter was negotiated into the left upper lobe pulmonary artery and mechanical thrombectomy was performed with the separator. Follow-up imaging demonstrated a good result and therefore the catheter was renegotiated into the left lower lobe pulmonary artery and again mechanical thrombectomy was performed. Passes were made with both the Penumbra catheter itself as well as introducing the separator. Follow-up imaging was then performed.  A very small amount of residual thrombus was seen in the left lung after thrombectomy.  After review these images wires were reintroduced and the catheter is removed. Then, the sheath is then pulled, the proglide device is secured, a Monocryl skin suture was placed and pressure is held. A sterile dressing is placed.    Findings:   Right heart imaging:  Right atrium and right ventricle and the pulmonary outflow tract appears mildly dilated  Right lung:  This demonstrated extensive thrombosis of the right main pulmonary artery as well as the primary lower lobe, middle lobe, and upper lobe branches of the pulmonary artery.  Left lung:  This demonstrated a relatively small amount of thrombus within the left lower lobe and left upper lobe pulmonary artery.    Disposition: Patient was taken to the recovery room in stable condition having tolerated the procedure well.  Selinda Gu 10/24/2023,1:44 PM

## 2023-10-24 NOTE — Progress Notes (Signed)
 Dr. Marea at bedside, speaking with pt. And family re: procedural results. All persons verbalized understanding of conversation with MD.

## 2023-10-24 NOTE — Consult Note (Signed)
 Pharmacy Consult Note - Anticoagulation  Pharmacy Consult for heparin  Indication: pulmonary embolus  PATIENT MEASUREMENTS: Height: 5' 10 (177.8 cm) Weight: 91.6 kg (201 lb 15.1 oz) IBW/kg (Calculated) : 73 HEPARIN  DW (KG): 91.6  VITAL SIGNS: Temp: 98.7 F (37.1 C) (09/17 0500) BP: 161/90 (09/17 0500) Pulse Rate: 74 (09/17 0500)  Recent Labs    10/24/23 0348  HGB 15.9  HCT 46.8  PLT 158  HEPARINUNFRC 0.38  CREATININE 1.13    Estimated Creatinine Clearance: 62.3 mL/min (by C-G formula based on SCr of 1.13 mg/dL).  PAST MEDICAL HISTORY: Past Medical History:  Diagnosis Date   Allergy    Arthritis    Bladder tumor    Chronic bilateral low back pain with bilateral sciatica    Chronic cough    DVT (deep venous thrombosis) (HCC) 04/2017   was on blood thinners until hematuria.  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 male with PMH including HTN, BPH, thrombocytopenia, provoked DVT no longer on Eliquis  is presenting with pulmonary embolism (PE). CT chest shows extensive PE involving right main, lobar, and subsegmental pulmonary arteries as well as LLL segmental pulmonary arteries. PLT are within normal limits. Patient is not on chronic anticoagulation per chart review. Pharmacy has been consulted to  initiate and manage heparin  intravenous infusion.  Pertinent medications:  No chronic anticoagulation per chart review Aspirin 81mg   Goal(s) of therapy: Heparin  level 0.3 - 0.7 units/mL Monitor platelets by anticoagulation protocol: Yes   Baseline anticoagulation labs: Recent Labs    10/22/23 0314 10/23/23 0619 10/24/23 0348  HGB 15.3 16.8 15.9  PLT 136* 136* 158   Baseline aPTT, INR ordered  Date Time HL Rate/Comment 09/14 0716 0.95 SUPRAtherapeutic@1500  units/hr 09/14 1700 0.63 Therapeutic x 1 09/15   0035   0.59     Therapeutic X 2 09/16 0619 0.47 Therapeutic x 3 09/17 0348 0.38 Therapeutic x 4  PLAN: HL therapeutic x 4 Continue heparin  infusion at 1300 units/hour. Recheck HL on 9/18 with AM labs  Monitor CBC daily while on heparin  infusion.  Rankin CANDIE Dills, PharmD, [facility] 10/24/2023 5:45 AM

## 2023-10-24 NOTE — Progress Notes (Signed)
 [facility]  - [place] at [facility]   PATIENT NAME: Daniel Mason    MR#:  [id number]  DATE OF BIRTH:  1945/08/09  SUBJECTIVE:  son, wife, daughter at bedside. Patient apparently came in with overall not feeling well for last several weeks woke up with sudden onset shortness of breath and some tightness in the left leg. Found to have PE and DVT. Currently NPO earlier for mechanical thrombectomy    VITALS:  Blood pressure (!) 142/73, pulse 72, temperature 97.8 F (36.6 C), resp. rate 18, height 5' 10 (1.778 m), weight 91.6 kg, SpO2 95%.  PHYSICAL EXAMINATION:   GENERAL:  78 y.o.-year-old patient with no acute distress.  LUNGS: Normal breath sounds bilaterally, no wheezing CARDIOVASCULAR: S1, S2 normal. No murmur   ABDOMEN: Soft, nontender, nondistended. Bowel sounds present.  EXTREMITIES: No  edema b/l.    NEUROLOGIC: nonfocal  patient is alert and awake   LABORATORY PANEL:  CBC Recent Labs  Lab 10/24/23 0348  WBC 8.4  HGB 15.9  HCT 46.8  PLT 158    Chemistries  Recent Labs  Lab 10/23/23 0619 10/24/23 0348  NA 140 140  K 3.9 3.5  CL 110 107  CO2 22 24  GLUCOSE 104* 105*  BUN 18 17  CREATININE 1.02 1.13  CALCIUM 8.5* 8.5*  MG 2.0  --      Assessment and Plan  Daniel Mason is a 78 y.o. male with medical history significant for HTN, Barrett's esophagus and BPH and history of prior PE 6 years ago off AC for the past 3 years being admitted with an acute PE.  Patient reports shortness of breath and dyspnea on exertion for the past several days following a trip to [place]  and has noted swelling mostly in his left lower extremity.  He denies chest pain, palpitations, cough, fever or chills.In the ED tachycardic to 110, tachypneic to the mid 20s with O2 sat in the high 90s on room air.  A   CTA PE protocol showed extensive acute PE involving right main, lobar and segmental pulmonary arteries and left lower lobar segmental pulmonary arteries  without evidence of right heart strain. 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   LE DVT : Occlusive thrombus in the left popliteal vein, posterior tibial vein, and peroneal vein compatible with left lower extremity deep vein thrombosis.  Acute pulmonary embolism (HCC) History of prior PE off AC x 3 years Left-sided DVT of lower extremitie --Patient with submassive PE without heart strain at this time --Echo showing EF 50 to 55% with grade 1 diastolic dysfunction --Supplemental O2 if needed --Continue heparin  drip -- vascular Surgery recommends pulmonary thrombectomy  -- hematology follow-up with Dr. Jacobo as outpatient in next couple weeks. Informed by secure chat   Moderate persistent asthma without complication --Albuterol  as needed   History of Barrett's esophagus ---No acute issues.  Continue home meds   Hypertension --Continue home meds   BPH (benign prostatic hyperplasia) --Continue home meds    Procedures: Family communication : family at bedside Consults : vascular surgery CODE STATUS: full DVT Prophylaxis : heparin  drip Level of care: Progressive Status is: Inpatient Remains inpatient appropriate because: patient midsemester PE. Needs to be on IV heparin  drip after procedure and will transition to oral anticoagulation tomorrow if stable.    TOTAL TIME TAKING CARE OF THIS PATIENT:  45 minutes.  >50% time spent on counselling and coordination of care  Note: This dictation was prepared with Dragon dictation along with smaller phrase technology. Any transcriptional errors that result from this process are unintentional.  Leita Blanch M.D    [facility]   CC: Primary care physician; Glover Lenis, MD

## 2023-10-25 ENCOUNTER — Encounter: Payer: Self-pay | Admitting: Vascular Surgery

## 2023-10-25 ENCOUNTER — Other Ambulatory Visit: Payer: Self-pay

## 2023-10-25 DIAGNOSIS — I10 Essential (primary) hypertension: Secondary | ICD-10-CM | POA: Diagnosis not present

## 2023-10-25 DIAGNOSIS — I82432 Acute embolism and thrombosis of left popliteal vein: Secondary | ICD-10-CM | POA: Diagnosis not present

## 2023-10-25 DIAGNOSIS — I82442 Acute embolism and thrombosis of left tibial vein: Secondary | ICD-10-CM | POA: Diagnosis not present

## 2023-10-25 DIAGNOSIS — Z9889 Other specified postprocedural states: Secondary | ICD-10-CM

## 2023-10-25 DIAGNOSIS — I2699 Other pulmonary embolism without acute cor pulmonale: Secondary | ICD-10-CM | POA: Diagnosis not present

## 2023-10-25 DIAGNOSIS — N401 Enlarged prostate with lower urinary tract symptoms: Secondary | ICD-10-CM | POA: Diagnosis not present

## 2023-10-25 LAB — CBC
HCT: 42.5 % (ref 39.0–52.0)
Hemoglobin: 14.8 g/dL (ref 13.0–17.0)
MCH: 29.8 pg (ref 26.0–34.0)
MCHC: 34.8 g/dL (ref 30.0–36.0)
MCV: 85.7 fL (ref 80.0–100.0)
Platelets: 150 K/uL (ref 150–400)
RBC: 4.96 MIL/uL (ref 4.22–5.81)
RDW: 12.9 % (ref 11.5–15.5)
WBC: 12.2 K/uL — ABNORMAL HIGH (ref 4.0–10.5)
nRBC: 0 % (ref 0.0–0.2)

## 2023-10-25 LAB — GLUCOSE, CAPILLARY: Glucose-Capillary: 152 mg/dL — ABNORMAL HIGH (ref 70–99)

## 2023-10-25 LAB — HEPARIN LEVEL (UNFRACTIONATED): Heparin Unfractionated: 0.42 [IU]/mL (ref 0.30–0.70)

## 2023-10-25 MED ORDER — APIXABAN 5 MG PO TABS
ORAL_TABLET | ORAL | 3 refills | Status: AC
  Filled 2023-10-25: qty 60, 25d supply, fill #0

## 2023-10-25 MED ORDER — APIXABAN 5 MG PO TABS
10.0000 mg | ORAL_TABLET | Freq: Two times a day (BID) | ORAL | Status: DC
Start: 2023-10-25 — End: 2023-10-25
  Administered 2023-10-25: 10 mg via ORAL
  Filled 2023-10-25: qty 2

## 2023-10-25 MED ORDER — APIXABAN 5 MG PO TABS
5.0000 mg | ORAL_TABLET | Freq: Two times a day (BID) | ORAL | Status: DC
Start: 2023-11-01 — End: 2023-10-25

## 2023-10-25 MED ORDER — PREDNISONE 20 MG PO TABS
20.0000 mg | ORAL_TABLET | Freq: Every day | ORAL | 0 refills | Status: AC
  Filled 2023-10-25: qty 5, 5d supply, fill #0

## 2023-10-25 MED ORDER — ALBUTEROL SULFATE HFA 108 (90 BASE) MCG/ACT IN AERS
2.0000 | INHALATION_SPRAY | Freq: Four times a day (QID) | RESPIRATORY_TRACT | 1 refills | Status: AC | PRN
  Filled 2023-10-25: qty 6.7, 30d supply, fill #0

## 2023-10-25 NOTE — Discharge Summary (Signed)
 Physician Discharge Summary   Patient: Daniel Mason MRN: [id number] DOB: [birth year]  Admit date:     10/20/2023  Discharge date: 10/25/23  Discharge Physician: Leita Blanch   PCP: Glover Lenis, MD   Recommendations at discharge:   follow-up Dr. Malva he wanted follow-up Dr. Marea vascular surgery on your scheduled appointment follow-up Dr. Jacobo and the cancer center for recurrent PE DVT   Discharge Diagnoses: Principal Problem:   Acute pulmonary embolism (HCC) Active Problems:   BPH (benign prostatic hyperplasia)   Hypertension   History of pulmonary embolus (PE)   History of Barrett's esophagus   Moderate persistent asthma without complication   Acute deep vein thrombosis (DVT) of popliteal vein of left lower extremity (HCC)  Daniel Mason is a 78 y.o. male with medical history significant for HTN, Barrett's esophagus and BPH and history of prior PE 6 years ago off [facility] for the past 3 years being admitted with an acute PE.  Patient reports shortness of breath and dyspnea on exertion for the past several days following a trip to [place]  and has noted swelling mostly in his left lower extremity.  He denies chest pain, palpitations, cough, fever or chills.In the ED tachycardic to 110, tachypneic to the mid 20s with O2 sat in the high 90s on room air.  A    CTA PE protocol showed extensive acute PE involving right main, lobar and segmental pulmonary arteries and left lower lobar segmental pulmonary arteries without evidence of right heart strain. 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    LE DVT : Occlusive thrombus in the left popliteal vein, posterior tibial vein, and peroneal vein compatible with left lower extremity deep vein thrombosis.   Acute pulmonary embolism (HCC) History of prior PE off AC x 3  years Left-sided DVT of lower extremitie --Patient with submassive PE without heart strain at this time --Echo showing EF 50 to 55% with grade 1 diastolic dysfunction --Supplemental O2 if needed --pt was on IV heparin  drip -- vascular Surgery recommends pulmonary thrombectomy  -- hematology follow-up with Dr. Jacobo as outpatient in next couple weeks. Informed by secure chat --9/18-- discussed with vascular surgery Dr. Marea okay to switch to oral eliquis . Patient still coughing up some blood clots of which vascular is aware. -- Patient will follow-up with Dr. Jacobo at the cancer center for recurrent PE DVT   Moderate persistent asthma without complication --Albuterol  as needed -- patient given a script of prednisone  20 mg daily for five days in the event his cough becomes hacking advised to take it given history of eosinophilic asthma exacerbation   History of Barrett's esophagus ---No acute issues.  Continue home meds   Hypertension --Continue home meds   BPH (benign prostatic hyperplasia) --Continue home meds     Procedures:Mechanical pulmonary thrombectomy Family communication : family at bedside Consults : vascular surgery CODE STATUS: full  Patient otherwise hemodynamically stable. He is agreeable for discharge. Discharge discussed with patient's wife and son at bedside      Disposition: Home Diet recommendation:  Cardiac diet DISCHARGE MEDICATION: Allergies as of 10/25/2023       Reactions   Iodine  Shortness Of Breath, Cough   Intravenous iodine .  No problem with betadine   Sulfa Antibiotics Hives, Swelling        Medication List     STOP taking these medications  aspirin EC 81 MG tablet       TAKE these medications    albuterol  108 (90 Base) MCG/ACT inhaler Commonly known as: VENTOLIN  HFA Inhale 2 puffs into the lungs every 6 (six) hours as needed for wheezing or shortness of breath.   apixaban  5 MG Tabs tablet Commonly known as:  ELIQUIS  Take 2 tablets (10 mg total) by mouth 2 (two) times daily for 7 days, THEN 1 tablet (5 mg total) 2 (two) times daily. Start taking on: October 25, 2023   Azelastine HCl 137 MCG/SPRAY Soln Place 1 spray into both nostrils 2 (two) times daily.   bifidobacterium infantis capsule Take 1 capsule by mouth daily.   celecoxib 200 MG capsule Commonly known as: CELEBREX Take 200 mg by mouth daily.   doxylamine  (Sleep) 25 MG tablet Commonly known as: UNISOM  Take 25 mg by mouth at bedtime as needed.   fluticasone 50 MCG/ACT nasal spray Commonly known as: FLONASE Place 1 spray into both nostrils daily.   levocetirizine 5 MG tablet Commonly known as: XYZAL Take 5 mg by mouth at bedtime.   mepolizumab 100 MG injection Commonly known as: NUCALA Inject 100 mg into the skin every 30 (thirty) days.   montelukast 10 MG tablet Commonly known as: SINGULAIR Take 10 mg by mouth at bedtime.   multivitamin with minerals Tabs tablet Take 1 tablet by mouth daily.   omeprazole 40 MG capsule Commonly known as: PRILOSEC Take 40 mg by mouth at bedtime.   predniSONE  20 MG tablet Commonly known as: DELTASONE  Take 1 tablet (20 mg total) by mouth daily for 5 days.   tamsulosin  0.4 MG Caps capsule Commonly known as: FLOMAX  TAKE 1 CAPSULE BY MOUTH EVERYDAY AT BEDTIME   valsartan 40 MG tablet Commonly known as: DIOVAN Take 40 mg by mouth every morning.   vardenafil  20 MG tablet Commonly known as: LEVITRA  1 tablet 1 hour prior to intercourse as needed        Follow-up Information     Dew, Selinda RAMAN, MD Follow up in 4 week(s).   Specialties: Vascular Surgery, Radiology, Interventional Cardiology Why: Post Op PE and DVT. Needs Left Lower extremity Venous Ultrasound for DVT Contact information: [address] [place] [address] [phone]         Glover Lenis, MD. Schedule an appointment as soon as possible for a visit in 1 week(s).   Specialty: Family  Medicine Contact information: [address] [place] [address] [phone]         Jacobo Evalene PARAS, MD. Schedule an appointment as soon as possible for a visit in 3 week(s).   Specialty: Oncology Why: for recurrent PE/DVT Contact information: [address] [place] [address] [phone]                Discharge Exam: Filed Weights   10/24/23 0500 10/24/23 1120 10/25/23 0500  Weight: 91.6 kg 91.6 kg 88.6 kg  GENERAL:  78 y.o.-year-old patient with no acute distress.  LUNGS: Normal breath sounds bilaterally, no wheezing CARDIOVASCULAR: S1, S2 normal. No murmur   ABDOMEN: Soft, nontender, nondistended. Bowel sounds present.  EXTREMITIES: No  edema b/l.    NEUROLOGIC: nonfocal  patient is alert and awake   Condition at discharge: fair  The results of significant diagnostics from this hospitalization (including imaging, microbiology, ancillary and laboratory) are listed below for reference.   Imaging Studies: PERIPHERAL VASCULAR CATHETERIZATION Result Date: 10/24/2023 See surgical note for result.  ECHOCARDIOGRAM COMPLETE  Result Date: 10/22/2023    ECHOCARDIOGRAM REPORT   Patient Name:   NICKALAUS CROOKE Date of Exam: 10/22/2023 Medical Rec #:  [id number]    Height:       70.0 in Accession #:    [id number]   Weight:       204.0 lb Date of Birth:  13-Apr-1945     BSA:          2.105 m Patient Age:    78 years     BP:           182/93 mmHg Patient Gender: M            HR:           60 bpm. Exam Location:  [facility] Procedure: 2D Echo, Cardiac Doppler and Color Doppler (Both Spectral and Color            Flow Doppler were utilized during procedure). Indications:     Pilmonary Embolus  History:         Patient has no prior history of Echocardiogram examinations.                  Risk Factors:Hypertension.  Sonographer:     Philomena Daring Referring Phys:  [id number] DELAYNE LULLA SOLIAN Diagnosing Phys: Deatrice Cage MD IMPRESSIONS  1. Left ventricular ejection fraction, by  estimation, is 50 to 55%. The left ventricle has low normal function. The left ventricle has no regional wall motion abnormalities. Left ventricular diastolic parameters are consistent with Grade I diastolic dysfunction (impaired relaxation).  2. Right ventricular systolic function is normal. The right ventricular size is normal. There is normal pulmonary artery systolic pressure. The estimated right ventricular systolic pressure is 32.2 mmHg.  3. Left atrial size was mildly dilated.  4. The mitral valve is normal in structure. Mild mitral valve regurgitation. No evidence of mitral stenosis.  5. The aortic valve is normal in structure. Aortic valve regurgitation is mild to moderate. No aortic stenosis is present.  6. Aortic dilatation noted. There is mild dilatation of the aortic root, measuring 40 mm. There is moderate dilatation of the ascending aorta, measuring 45 mm.  7. The inferior vena cava is normal in size with greater than 50% respiratory variability, suggesting right atrial pressure of 3 mmHg. FINDINGS  Left Ventricle: Left ventricular ejection fraction, by estimation, is 50 to 55%. The left ventricle has low normal function. The left ventricle has no regional wall motion abnormalities. The left ventricular internal cavity size was normal in size. There is no left ventricular hypertrophy. Left ventricular diastolic parameters are consistent with Grade I diastolic dysfunction (impaired relaxation). Right Ventricle: The right ventricular size is normal. No increase in right ventricular wall thickness. Right ventricular systolic function is normal. There is normal pulmonary artery systolic pressure. The tricuspid regurgitant velocity is 2.70 m/s, and  with an assumed right atrial pressure of 3 mmHg, the estimated right ventricular systolic pressure is 32.2 mmHg. Left Atrium: Left atrial size was mildly dilated. Right Atrium: Right atrial size was normal in size. Pericardium: There is no evidence of  pericardial effusion. Mitral Valve: The mitral valve is normal in structure. Mild mitral valve regurgitation. No evidence of mitral valve stenosis. Tricuspid Valve: The tricuspid valve is normal in structure. Tricuspid valve regurgitation is mild . No evidence of tricuspid stenosis. Aortic Valve: The aortic valve is normal in structure. Aortic valve regurgitation is mild to moderate. No aortic stenosis is present. Pulmonic Valve: The pulmonic valve was normal  in structure. Pulmonic valve regurgitation is mild. No evidence of pulmonic stenosis. Aorta: Aortic dilatation noted. There is mild dilatation of the aortic root, measuring 40 mm. There is moderate dilatation of the ascending aorta, measuring 45 mm. Venous: The inferior vena cava is normal in size with greater than 50% respiratory variability, suggesting right atrial pressure of 3 mmHg. IAS/Shunts: No atrial level shunt detected by color flow Doppler.  LEFT VENTRICLE PLAX 2D LVIDd:         5.00 cm   Diastology LVIDs:         4.28 cm   LV e' medial:    5.13 cm/s LV PW:         1.04 cm   LV E/e' medial:  10.0 LV IVS:        1.03 cm   LV e' lateral:   6.29 cm/s LVOT diam:     2.30 cm   LV E/e' lateral: 8.2 LV SV:         88 LV SV Index:   42 LVOT Area:     4.15 cm  RIGHT VENTRICLE             IVC RV S prime:     11.30 cm/s  IVC diam: 1.75 cm TAPSE (M-mode): 1.6 cm LEFT ATRIUM           Index        RIGHT ATRIUM           Index LA diam:      3.30 cm 1.57 cm/m   RA Area:     18.30 cm LA Vol (A2C): 63.6 ml 30.21 ml/m  RA Volume:   41.20 ml  19.57 ml/m LA Vol (A4C): 70.4 ml 33.44 ml/m  AORTIC VALVE LVOT Vmax:   93.80 cm/s LVOT Vmean:  63.900 cm/s LVOT VTI:    0.211 m  AORTA Ao Root diam: 4.00 cm Ao Asc diam:  4.50 cm MITRAL VALVE               TRICUSPID VALVE MV Area (PHT): 2.25 cm    TR Peak grad:   29.2 mmHg MV Decel Time: 337 msec    TR Vmax:        270.00 cm/s MV E velocity: 51.40 cm/s MV A velocity: 82.50 cm/s  SHUNTS MV E/A ratio:  0.62        Systemic  VTI:  0.21 m                            Systemic Diam: 2.30 cm Deatrice Cage MD Electronically signed by Deatrice Cage MD Signature Date/Time: 10/22/2023/11:01:37 AM    Final    US  Venous Img Lower Bilateral (DVT) Result Date: 10/21/2023 CLINICAL DATA:  Acute pulmonary embolism EXAM: BILATERAL LOWER EXTREMITY VENOUS DOPPLER ULTRASOUND TECHNIQUE: Gray-scale sonography with compression, as well as color and duplex ultrasound, were performed to evaluate the deep venous system(s) from the level of the common femoral vein through the popliteal and proximal calf veins. COMPARISON:  None Available. FINDINGS: VENOUS Occlusive thrombus in the left popliteal vein, posterior tibial vein, and peroneal vein compatible with deep vein thrombosis. No DVT identified in the right lower extremity or in the left common femoral vein or femoral vein. OTHER None. Limitations: none IMPRESSION: 1. Occlusive thrombus in the left popliteal vein, posterior tibial vein, and peroneal vein compatible with left lower extremity deep vein thrombosis. Electronically Signed   By: Ryan  Ramond M.D.   On: 10/21/2023 11:00   CT Angio Chest PE W and/or Wo Contrast Result Date: 10/20/2023 EXAM: CTA of the Chest with contrast for PE 10/20/2023 08:35:43 PM TECHNIQUE: CTA of the chest was performed after the administration of intravenous contrast. Multiplanar reformatted images are provided for review. MIP images are provided for review. Automated exposure control, iterative reconstruction, and/or weight based adjustment of the mA/kV was utilized to reduce the radiation dose to as low as reasonably achievable. COMPARISON: None available. CLINICAL HISTORY: SOB, history of PE. Patient complains of shortness of breath for several days, dyspnea on exertion. Patient has history of asthma, lung sounds clear bilaterally. 1+ pitting edema noted in lower extremities. Patient denies chest pain, dizziness. FINDINGS: PULMONARY ARTERIES: There is adequate  opacification of the pulmonary arterial tree. There are extensive intraluminal filling defects identified within the right main pulmonary artery branching into nearly all lobar and segmental pulmonary arteries as well as within the left lower lobar segmental pulmonary arteries in keeping with acute pulmonary embolism. The central pulmonary arteries are of normal caliber. There is no CT evidence of right heart strain. MEDIASTINUM: Cardiac size is within normal limits. Moderate left anterior descending coronary artery calcification. No pericardial effusion. Mild atherosclerotic calcification within the thoracic aorta. 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 The descending thoracic aorta is of normal caliber. Recommend annual imaging followup by CTA or MRA. This recommendation follows 2010 ACCF/AHA/AATS/ACR/ASA/SCA/SCAI/SIR/STS/SVM guidelines for the diagnosis and management of patients with thoracic aortic disease. Circulation. 2010; 121: Z733-z630. LYMPH NODES: No mediastinal, hilar or axillary lymphadenopathy. 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 There is bronchial wall thickening and scattered airway impaction within the lower lobes bilaterally in keeping with airway inflammation and possibly the sequelae infectious bronchiolitis or aspiration. No pneumothorax or pleural effusion. UPPER ABDOMEN: Status post cholecystectomy. No acute abnormality within the visualized upper abdomen. Asymmetric scarring within the visualized upper pole of the left kidney. SOFT TISSUES AND BONES: Osseous structures are age appropriate. No acute bone abnormality. No lytic or blastic bone lesion. IMPRESSION: 1. Extensive acute pulmonary embolism involving the right main, lobar, and segmental pulmonary arteries and  the left lower lobar segmental pulmonary arteries. No CT evidence of right heart strain. 2. 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 Recommend annual imaging followup by CTA or MRA per 2010 ACCF/AHA/AATS/ACR/ASA/SCA/SCAI/SIR/STS/SVM guidelines. 3. Scattered ground-glass pulmonary infiltrate within the basilar right upper lobe and right lower lobe, possibly representing atelectasis or mild hemorrhagic infiltrate in the setting of pulmonary infarction. 4. Bronchial wall thickening and scattered airway impaction within the lower lobes bilaterally, consistent with airway inflammation and possibly the sequelae of infectious bronchiolitis or aspiration. Electronically signed by: Dorethia Molt MD 10/20/2023 08:43 PM EDT RP Workstation: [id number]   DG Chest 2 View Result Date: 10/20/2023 CLINICAL DATA:  Short of breath EXAM: CHEST - 2 VIEW COMPARISON:  None Available. FINDINGS: Frontal and lateral views of the chest demonstrate an unremarkable cardiac silhouette. No airspace disease, effusion, or pneumothorax. No acute bony abnormalities. IMPRESSION: 1. No acute intrathoracic process. Electronically Signed   By: Ozell Daring M.D.   On: 10/20/2023 15:14    Microbiology: Results for orders placed or performed in visit on 10/25/22  CULTURE, URINE COMPREHENSIVE     Status: None   Collection Time:  10/25/22  3:07 PM   Specimen: Urine   UR  Result Value Ref Range Status   Urine Culture, Comprehensive Final report  Final   Organism ID, Bacteria Comment  Final    Comment: Mixed urogenital flora Less than 1,000 colony forming units per mL   Microscopic Examination     Status: Abnormal   Collection Time: 10/25/22  3:07 PM   Urine  Result Value Ref Range Status   WBC, UA 6-10 (A) 0 - 5 /hpf Final   RBC, Urine 3-10 (A) 0 - 2 /hpf Final   Epithelial Cells (non renal) 0-10 0 - 10 /hpf Final   Mucus, UA Present (A) Not Estab. Final   Bacteria, UA Moderate (A) None  seen/Few Final    Labs: CBC: Recent Labs  Lab 10/21/23 0716 10/22/23 0314 10/23/23 0619 10/24/23 0348 10/25/23 0323  WBC 9.8 9.7 8.1 8.4 12.2*  NEUTROABS  --  6.2 5.2 5.1  --   HGB 15.1 15.3 16.8 15.9 14.8  HCT 42.6 43.9 47.3 46.8 42.5  MCV 84.7 86.2 84.9 86.5 85.7  PLT 166 136* 136* 158 150   Basic Metabolic Panel: Recent Labs  Lab 10/20/23 1359 10/22/23 0314 10/23/23 0619 10/24/23 0348  NA 140 141 140 140  K 3.7 4.0 3.9 3.5  CL 108 111 110 107  CO2 19* 20* 22 24  GLUCOSE 86 90 104* 105*  BUN 15 20 18 17   CREATININE 1.19 1.18 1.02 1.13  CALCIUM 8.7* 8.2* 8.5* 8.5*  MG  --   --  2.0  --     Discharge time spent: greater than 30 minutes.  Signed: Leita Blanch, MD [facility] 10/25/2023

## 2023-10-25 NOTE — Progress Notes (Signed)
  Progress Note    10/25/2023 10:21 AM 1 Day Post-Op  Subjective:  Laderius Valbuena is a 78 yo male who is now POD #1 from   Procedure(s) Performed:             1.  Contrast injection right heart             2.  Mechanical thrombectomy using the penumbra CAT 12 catheter to the right lower lobe, middle lobe, and upper lobe pulmonary arteries into the left lower and upper lobe pulmonary arteries             3.  Selective catheter placement left lower lobe and left upper lobe pulmonary artery             4.  Selective catheter placement right lower lobe, middle lobe, and upper lobe pulmonary artery  Patient is resting comfortably in bed this morning. Getting a breathing treatment to help with some asthma. Coughed up some blood tinged sputum overnight. No other complaints to note. Vitals all remain stable.    Vitals:   10/25/23 0405 10/25/23 0802  BP: (!) 147/84 (!) 147/82  Pulse: 85 68  Resp:    Temp: 97.6 F (36.4 C) 97.8 F (36.6 C)  SpO2: 97% 97%   Physical Exam: Cardiac:  RRR, normal S1 and S2.  No murmurs noted. Lungs: Clear on auscultation throughout but diminished in the bases.  Nonlabored breathing.  No rales rhonchi or wheezing noted today. Incisions: None Extremities: All extremities are warm to touch with palpable pulses.  Left lower extremity swelling seems to have resolved. Abdomen: Positive bowel sounds throughout, soft, nontender and nondistended. Neurologic: Alert and oriented x 3, answers all questions follows commands appropriately.  CBC    Component Value Date/Time   WBC 12.2 (H) 10/25/2023 0323   RBC 4.96 10/25/2023 0323   HGB 14.8 10/25/2023 0323   HCT 42.5 10/25/2023 0323   PLT 150 10/25/2023 0323   MCV 85.7 10/25/2023 0323   MCH 29.8 10/25/2023 0323   MCHC 34.8 10/25/2023 0323   RDW 12.9 10/25/2023 0323   LYMPHSABS 2.4 10/24/2023 0348   MONOABS 0.6 10/24/2023 0348   EOSABS 0.1 10/24/2023 0348   BASOSABS 0.1 10/24/2023 0348    BMET    Component  Value Date/Time   NA 140 10/24/2023 0348   K 3.5 10/24/2023 0348   CL 107 10/24/2023 0348   CO2 24 10/24/2023 0348   GLUCOSE 105 (H) 10/24/2023 0348   BUN 17 10/24/2023 0348   CREATININE 1.13 10/24/2023 0348   CALCIUM 8.5 (L) 10/24/2023 0348   GFRNONAA >60 10/24/2023 0348   GFRAA >60 11/19/2018 1448    INR    Component Value Date/Time   INR 1.0 10/20/2023 2104     Intake/Output Summary (Last 24 hours) at 10/25/2023 1021 Last data filed at 10/24/2023 1852 Gross per 24 hour  Intake 240 ml  Output --  Net 240 ml     Assessment/Plan:  78 y.o. male is s/p SEE ABOVE 1 Day Post-Op   PLAN Discontinue Heparin  Infusion today Start eliquis  10 mg BID X 7 days then decrease to 5 mg BID indefinitely.  Okay per vascular surgery for patient to discharge to home after receiving his first dose of Eliquis .  Will follow up with vascular surgery as scheduled.   DVT prophylaxis:  Heparin  Infusion to be converted to oral anticoagulation Eliquis     [name] [facility] 10/25/2023 10:21 AM

## 2023-10-25 NOTE — Discharge Instructions (Signed)
 Take your prednsione rx if our cough worsens

## 2023-10-25 NOTE — Consult Note (Signed)
 Pharmacy Consult Note - Anticoagulation  Pharmacy Consult for heparin  Indication: pulmonary embolus  PATIENT MEASUREMENTS: Height: 5' 10 (177.8 cm) Weight: 91.6 kg (201 lb 15.1 oz) IBW/kg (Calculated) : 73 HEPARIN  DW (KG): 91.6  VITAL SIGNS: Temp: 97.6 F (36.4 C) (09/18 0405) Temp Source: Oral (09/18 0405) BP: 147/84 (09/18 0405) Pulse Rate: 85 (09/18 0405)  Recent Labs    10/24/23 0348 10/25/23 0323  HGB 15.9 14.8  HCT 46.8 42.5  PLT 158 150  HEPARINUNFRC 0.38 0.42  CREATININE 1.13  --     Estimated Creatinine Clearance: 62.3 mL/min (by C-G formula based on SCr of 1.13 mg/dL).  PAST MEDICAL HISTORY: Past Medical History:  Diagnosis Date   Allergy    Arthritis    Bladder tumor    Chronic bilateral low back pain with bilateral sciatica    Chronic cough    DVT (deep venous thrombosis) (HCC) 04/2017   was on blood thinners until hematuria.  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 male with PMH including HTN, BPH, thrombocytopenia, provoked DVT no longer on Eliquis  is presenting with pulmonary embolism (PE). CT chest shows extensive PE involving right main, lobar, and subsegmental pulmonary arteries as well as LLL segmental pulmonary arteries. PLT are within normal limits. Patient is not on chronic  anticoagulation per chart review. Pharmacy has been consulted to initiate and manage heparin  intravenous infusion.  Pertinent medications:  No chronic anticoagulation per chart review Aspirin 81mg   Goal(s) of therapy: Heparin  level 0.3 - 0.7 units/mL Monitor platelets by anticoagulation protocol: Yes   Baseline anticoagulation labs: Recent Labs    10/23/23 0619 10/24/23 0348 10/25/23 0323  HGB 16.8 15.9 14.8  PLT 136* 158 150   Baseline aPTT, INR ordered  Date Time HL Rate/Comment 09/14 0716 0.95 SUPRAtherapeutic@1500  units/hr 09/14 1700 0.63 Therapeutic x 1 09/15   0035   0.59     Therapeutic X 2 09/16 0619 0.47 Therapeutic x 3 09/17 0348 0.38 Therapeutic x 4 9/18 0323 0.42 Therapeutic x 5  PLAN: HL therapeutic x 5 Continue heparin  infusion at 1300 units/hour. Recheck HL on 9/19 with AM labs  Monitor CBC daily while on heparin  infusion.  Rankin CANDIE Dills, PharmD, [facility] 10/25/2023 5:08 AM

## 2023-11-05 ENCOUNTER — Encounter: Payer: Self-pay | Admitting: Oncology

## 2023-11-05 ENCOUNTER — Inpatient Hospital Stay

## 2023-11-05 ENCOUNTER — Inpatient Hospital Stay: Attending: Oncology | Admitting: Oncology

## 2023-11-05 VITALS — BP 129/83 | HR 77 | Temp 97.8°F | Resp 18 | Ht 70.0 in | Wt 200.0 lb

## 2023-11-05 DIAGNOSIS — Z7901 Long term (current) use of anticoagulants: Secondary | ICD-10-CM | POA: Insufficient documentation

## 2023-11-05 DIAGNOSIS — I82452 Acute embolism and thrombosis of left peroneal vein: Secondary | ICD-10-CM | POA: Insufficient documentation

## 2023-11-05 DIAGNOSIS — I2699 Other pulmonary embolism without acute cor pulmonale: Secondary | ICD-10-CM

## 2023-11-05 DIAGNOSIS — I82432 Acute embolism and thrombosis of left popliteal vein: Secondary | ICD-10-CM | POA: Insufficient documentation

## 2023-11-05 DIAGNOSIS — I82442 Acute embolism and thrombosis of left tibial vein: Secondary | ICD-10-CM | POA: Diagnosis not present

## 2023-11-05 LAB — ANTITHROMBIN III: AntiThromb III Func: 109 % (ref 75–120)

## 2023-11-06 NOTE — Progress Notes (Signed)
 [facility]  [phone] [phone]  ID: Daniel Mason OB: December 01, 1945  MR#: [id number]  RDW#:[id number]  Patient Care Team: Glover Lenis, MD as PCP - General (Family Medicine)  CHIEF COMPLAINT: DVT, bilateral pulmonary embolism.  INTERVAL HISTORY: Patient is a 78 year old male who has not been seen in clinic in greater than 5 years is referred back after admission to the hospital where he is noted to have DVT as well as extensive bilateral pulmonary embolism.  Patient has a history of DVT approximately 6 years ago and was on Eliquis , but this was attributed to a long car ride prior to his diagnosis.  He currently feels well and is asymptomatic.  He has no neurologic complaints.  He denies any recent fevers or illnesses.  He has a good appetite and denies weight loss.  He has no chest pain, shortness of breath, cough, or hemoptysis.  He denies any nausea, vomiting, constipation, or diarrhea.  He has no urinary complaints.  Patient offers no further specific complaints today.  REVIEW OF SYSTEMS:   Review of Systems  Constitutional: Negative.  Negative for fever and malaise/fatigue.  Respiratory: Negative.  Negative for cough, hemoptysis and shortness of breath.   Cardiovascular: Negative.  Negative for chest pain and leg swelling.  Gastrointestinal: Negative.  Negative for abdominal pain.  Genitourinary: Negative.  Negative for dysuria.  Musculoskeletal: Negative.  Negative for back pain.  Skin: Negative.  Negative for rash.  Neurological: Negative.  Negative for dizziness, focal weakness, weakness and headaches.  Psychiatric/Behavioral: Negative.  The patient is not nervous/anxious.     As per HPI. Otherwise, a complete review of systems is negative.  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   L lat abdomen, excised in [place]  04/30/19   Dysplastic nevus 06/11/2023   mid to upper back spinal - severe, margins free but close, recheck in 6 mths   Dysplastic nevus 06/11/2023   mid to low back spinal - moderate   Eosinophilic asthma    Gallbladder disease    GERD (gastroesophageal reflux disease)    History of actinic keratoses 08/31/2022   left ear and preauricular bx proven treated already   History of Barrett's esophagus    History of BPH    History of cataract    History of kidney stones    bladder stones and 2 small kidney stones   History of pulmonary embolus (PE)    Hypertension    Thrombocytopenia    Tremor     PAST SURGICAL HISTORY: Past Surgical History:  Procedure Laterality Date   CHOLECYSTECTOMY  2010   COLONOSCOPY WITH PROPOFOL  N/A 12/02/2020   Procedure: COLONOSCOPY WITH PROPOFOL ;  Surgeon: Onita Elspeth Sharper, DO;  Location: [facility] ENDOSCOPY;  Service: Gastroenterology;  Laterality: N/A;   CYSTOSCOPY WITH LITHOLAPAXY N/A 11/21/2018   Procedure: CYSTOSCOPY WITH LITHOLAPAXY;  Surgeon: Kassie Sharper SAUNDERS, MD;  Location: [facility] ORS;  Service: Urology;  Laterality: N/A;   ESOPHAGOGASTRODUODENOSCOPY (EGD) WITH PROPOFOL  N/A 12/02/2020   Procedure: ESOPHAGOGASTRODUODENOSCOPY (EGD) WITH PROPOFOL ;  Surgeon: Onita Elspeth Sharper, DO;  Location: [facility] ENDOSCOPY;  Service: Gastroenterology;  Laterality: N/A;   EYE SURGERY Bilateral 2001  cataract extractions   GREEN LIGHT LASER TURP (TRANSURETHRAL RESECTION OF PROSTATE N/A 11/21/2018   Procedure: GREEN LIGHT LASER TURP (TRANSURETHRAL RESECTION OF PROSTATE;  Surgeon: Kassie Ozell SAUNDERS, MD;  Location: [facility] ORS;  Service: Urology;  Laterality: N/A;   HERNIA REPAIR Left 2010   inguinal   NASAL SINUS SURGERY  1997, 2015   x 2   PROSTATE SURGERY  2001, 2011   .  2nd surgery was a turp   PROSTATOTOMY   11/21/2018   PULMONARY THROMBECTOMY Bilateral 10/24/2023   Procedure: PULMONARY THROMBECTOMY;  Surgeon: Marea Selinda RAMAN, MD;  Location: [facility] INVASIVE CV LAB;  Service: Cardiovascular;  Laterality: Bilateral;   TOTAL HIP ARTHROPLASTY Left 2016   TRANSURETHRAL RESECTION OF PROSTATE  11/07/2022   Procedure: TRANSURETHRAL RESECTION OF THE PROSTATE (TURP);  Surgeon: Twylla Glendia BROCKS, MD;  Location: [facility] ORS;  Service: Urology;;    FAMILY HISTORY: History reviewed. No pertinent family history.  ADVANCED DIRECTIVES (Y/N):  N  HEALTH MAINTENANCE: Social History   Tobacco Use   Smoking status: Never   Smokeless tobacco: Never  Vaping Use   Vaping status: Never Used  Substance Use Topics   Alcohol use: Yes    Comment: occ   Drug use: Never     Colonoscopy:  PAP:  Bone density:  Lipid panel:  Allergies  Allergen Reactions   Iodine  Shortness Of Breath and Cough    Intravenous iodine .  No problem with betadine   Sulfa Antibiotics Hives and Swelling    Current Outpatient Medications  Medication Sig Dispense Refill   albuterol  (VENTOLIN  HFA) 108 (90 Base) MCG/ACT inhaler Inhale 2 puffs into the lungs every 6 (six) hours as needed for wheezing or shortness of breath. 6.7 g 1   apixaban  (ELIQUIS ) 5 MG TABS tablet Take 2 tablets (10 mg total) by mouth 2 (two) times daily for 7 days, THEN 1 tablet (5 mg total) 2 (two) times daily. 60 tablet 3   Azelastine HCl 137 MCG/SPRAY SOLN Place 1 spray into both nostrils 2 (two) times daily.     bifidobacterium infantis (ALIGN) capsule Take 1 capsule by mouth daily.     celecoxib (CELEBREX) 200 MG capsule Take 200 mg by mouth daily.     doxylamine , Sleep, (UNISOM ) 25 MG tablet Take 25 mg by mouth at bedtime as needed.     fluticasone (FLONASE) 50 MCG/ACT nasal spray Place 1 spray into both nostrils daily.     levocetirizine (XYZAL) 5 MG tablet Take 5 mg by mouth at bedtime.     mepolizumab (NUCALA) 100 MG injection Inject 100 mg into the skin every 30  (thirty) days.      montelukast (SINGULAIR) 10 MG tablet Take 10 mg by mouth at bedtime.     Multiple Vitamin (MULTIVITAMIN WITH MINERALS) TABS tablet Take 1 tablet by mouth daily.     omeprazole (PRILOSEC) 40 MG capsule Take 40 mg by mouth at bedtime.     tamsulosin  (FLOMAX ) 0.4 MG CAPS capsule TAKE 1 CAPSULE BY MOUTH EVERYDAY AT BEDTIME 90 capsule 1   valsartan (DIOVAN) 40 MG tablet Take 40 mg by mouth every morning.     vardenafil  (LEVITRA ) 20 MG tablet 1 tablet 1 hour prior to intercourse as needed 12 tablet 3   No current facility-administered medications for this visit.    OBJECTIVE: Vitals:   11/05/23 1156  BP: 129/83  Pulse: 77  Resp: 18  Temp: 97.8 F (36.6 C)  SpO2: 99%     Body  mass index is 28.7 kg/m.    ECOG FS:0 - Asymptomatic  General: Well-developed, well-nourished, no acute distress. Eyes: Pink conjunctiva, anicteric sclera. HEENT: Normocephalic, moist mucous membranes. Lungs: No audible wheezing or coughing. Heart: Regular rate and rhythm. Abdomen: Soft, nontender, no obvious distention. Musculoskeletal: No edema, cyanosis, or clubbing. Neuro: Alert, answering all questions appropriately. Cranial nerves grossly intact. Skin: No rashes or petechiae noted. Psych: Normal affect. Lymphatics: No cervical, calvicular, axillary or inguinal LAD.   LAB RESULTS:  Lab Results  Component Value Date   NA 140 10/24/2023   K 3.5 10/24/2023   CL 107 10/24/2023   CO2 24 10/24/2023   GLUCOSE 105 (H) 10/24/2023   BUN 17 10/24/2023   CREATININE 1.13 10/24/2023   CALCIUM 8.5 (L) 10/24/2023   GFRNONAA >60 10/24/2023   GFRAA >60 11/19/2018    Lab Results  Component Value Date   WBC 12.2 (H) 10/25/2023   NEUTROABS 5.1 10/24/2023   HGB 14.8 10/25/2023   HCT 42.5 10/25/2023   MCV 85.7 10/25/2023   PLT 150 10/25/2023     STUDIES: PERIPHERAL VASCULAR CATHETERIZATION Result Date: 10/24/2023 See surgical note for result.  ECHOCARDIOGRAM COMPLETE Result Date:  10/22/2023    ECHOCARDIOGRAM REPORT   Patient Name:   MACKSON BOTZ Date of Exam: 10/22/2023 Medical Rec #:  [id number]    Height:       70.0 in Accession #:    [id number]   Weight:       204.0 lb Date of Birth:  [birth year]     BSA:          2.105 m Patient Age:    78 years     BP:           182/93 mmHg Patient Gender: M            HR:           60 bpm. Exam Location:  [facility] Procedure: 2D Echo, Cardiac Doppler and Color Doppler (Both Spectral and Color            Flow Doppler were utilized during procedure). Indications:     Pilmonary Embolus  History:         Patient has no prior history of Echocardiogram examinations.                  Risk Factors:Hypertension.  Sonographer:     Philomena Daring Referring Phys:  [id number] DELAYNE LULLA SOLIAN Diagnosing Phys: Deatrice Cage MD IMPRESSIONS  1. Left ventricular ejection fraction, by estimation, is 50 to 55%. The left ventricle has low normal function. The left ventricle has no regional wall motion abnormalities. Left ventricular diastolic parameters are consistent with Grade I diastolic dysfunction (impaired relaxation).  2. Right ventricular systolic function is normal. The right ventricular size is normal. There is normal pulmonary artery systolic pressure. The estimated right ventricular systolic pressure is 32.2 mmHg.  3. Left atrial size was mildly dilated.  4. The mitral valve is normal in structure. Mild mitral valve regurgitation. No evidence of mitral stenosis.  5. The aortic valve is normal in structure. Aortic valve regurgitation is mild to moderate. No aortic stenosis is present.  6. Aortic dilatation noted. There is mild dilatation of the aortic root, measuring 40 mm. There is moderate dilatation of the ascending aorta, measuring 45 mm.  7. The inferior vena cava is normal in size with greater than 50% respiratory variability, suggesting right atrial pressure of 3 mmHg. FINDINGS  Left Ventricle: Left ventricular  ejection fraction, by estimation, is 50 to 55%. The left  ventricle has low normal function. The left ventricle has no regional wall motion abnormalities. The left ventricular internal cavity size was normal in size. There is no left ventricular hypertrophy. Left ventricular diastolic parameters are consistent with Grade I diastolic dysfunction (impaired relaxation). Right Ventricle: The right ventricular size is normal. No increase in right ventricular wall thickness. Right ventricular systolic function is normal. There is normal pulmonary artery systolic pressure. The tricuspid regurgitant velocity is 2.70 m/s, and  with an assumed right atrial pressure of 3 mmHg, the estimated right ventricular systolic pressure is 32.2 mmHg. Left Atrium: Left atrial size was mildly dilated. Right Atrium: Right atrial size was normal in size. Pericardium: There is no evidence of pericardial effusion. Mitral Valve: The mitral valve is normal in structure. Mild mitral valve regurgitation. No evidence of mitral valve stenosis. Tricuspid Valve: The tricuspid valve is normal in structure. Tricuspid valve regurgitation is mild . No evidence of tricuspid stenosis. Aortic Valve: The aortic valve is normal in structure. Aortic valve regurgitation is mild to moderate. No aortic stenosis is present. Pulmonic Valve: The pulmonic valve was normal in structure. Pulmonic valve regurgitation is mild. No evidence of pulmonic stenosis. Aorta: Aortic dilatation noted. There is mild dilatation of the aortic root, measuring 40 mm. There is moderate dilatation of the ascending aorta, measuring 45 mm. Venous: The inferior vena cava is normal in size with greater than 50% respiratory variability, suggesting right atrial pressure of 3 mmHg. IAS/Shunts: No atrial level shunt detected by color flow Doppler.  LEFT VENTRICLE PLAX 2D LVIDd:         5.00 cm   Diastology LVIDs:         4.28 cm   LV e' medial:    5.13 cm/s LV PW:         1.04 cm   LV E/e' medial:  10.0 LV IVS:        1.03 cm   LV e' lateral:   6.29  cm/s LVOT diam:     2.30 cm   LV E/e' lateral: 8.2 LV SV:         88 LV SV Index:   42 LVOT Area:     4.15 cm  RIGHT VENTRICLE             IVC RV S prime:     11.30 cm/s  IVC diam: 1.75 cm TAPSE (M-mode): 1.6 cm LEFT ATRIUM           Index        RIGHT ATRIUM           Index LA diam:      3.30 cm 1.57 cm/m   RA Area:     18.30 cm LA Vol (A2C): 63.6 ml 30.21 ml/m  RA Volume:   41.20 ml  19.57 ml/m LA Vol (A4C): 70.4 ml 33.44 ml/m  AORTIC VALVE LVOT Vmax:   93.80 cm/s LVOT Vmean:  63.900 cm/s LVOT VTI:    0.211 m  AORTA Ao Root diam: 4.00 cm Ao Asc diam:  4.50 cm MITRAL VALVE               TRICUSPID VALVE MV Area (PHT): 2.25 cm    TR Peak grad:   29.2 mmHg MV Decel Time: 337 msec    TR Vmax:        270.00 cm/s MV E velocity: 51.40 cm/s MV A velocity: 82.50 cm/s  SHUNTS  MV E/A ratio:  0.62        Systemic VTI:  0.21 m                            Systemic Diam: 2.30 cm Deatrice Cage MD Electronically signed by Deatrice Cage MD Signature Date/Time: 10/22/2023/11:01:37 AM    Final    US  Venous Img Lower Bilateral (DVT) Result Date: 10/21/2023 CLINICAL DATA:  Acute pulmonary embolism EXAM: BILATERAL LOWER EXTREMITY VENOUS DOPPLER ULTRASOUND TECHNIQUE: Gray-scale sonography with compression, as well as color and duplex ultrasound, were performed to evaluate the deep venous system(s) from the level of the common femoral vein through the popliteal and proximal calf veins. COMPARISON:  None Available. FINDINGS: VENOUS Occlusive thrombus in the left popliteal vein, posterior tibial vein, and peroneal vein compatible with deep vein thrombosis. No DVT identified in the right lower extremity or in the left common femoral vein or femoral vein. OTHER None. Limitations: none IMPRESSION: 1. Occlusive thrombus in the left popliteal vein, posterior tibial vein, and peroneal vein compatible with left lower extremity deep vein thrombosis. Electronically Signed   By: Ryan Salvage M.D.   On: 10/21/2023 11:00   CT Angio  Chest PE W and/or Wo Contrast Result Date: 10/20/2023 EXAM: CTA of the Chest with contrast for PE 10/20/2023 08:35:43 PM TECHNIQUE: CTA of the chest was performed after the administration of intravenous contrast. Multiplanar reformatted images are provided for review. MIP images are provided for review. Automated exposure control, iterative reconstruction, and/or weight based adjustment of the mA/kV was utilized to reduce the radiation dose to as low as reasonably achievable. COMPARISON: None available. CLINICAL HISTORY: SOB, history of PE. Patient complains of shortness of breath for several days, dyspnea on exertion. Patient has history of asthma, lung sounds clear bilaterally. 1+ pitting edema noted in lower extremities. Patient denies chest pain, dizziness. FINDINGS: PULMONARY ARTERIES: There is adequate opacification of the pulmonary arterial tree. There are extensive intraluminal filling defects identified within the right main pulmonary artery branching into nearly all lobar and segmental pulmonary arteries as well as within the left lower lobar segmental pulmonary arteries in keeping with acute pulmonary embolism. The central pulmonary arteries are of normal caliber. There is no CT evidence of right heart strain. MEDIASTINUM: Cardiac size is within normal limits. Moderate left anterior descending coronary artery calcification. No pericardial effusion. Mild atherosclerotic calcification within the thoracic aorta. 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 The descending thoracic aorta is of normal caliber. Recommend annual imaging followup by CTA or MRA. This recommendation follows 2010 ACCF/AHA/AATS/ACR/ASA/SCA/SCAI/SIR/STS/SVM guidelines for the diagnosis and management of patients with thoracic aortic disease. Circulation. 2010; 121: Z733-z630. LYMPH NODES: No mediastinal, hilar or axillary lymphadenopathy. LUNGS AND PLEURA: Scattered ground-glass pulmonary  infiltrate within the basilar right upper lobe and right lower lobe may represent atelectasis or mild hemorrhagic infiltrate in the setting of pulmonary infarction. There is bronchial wall thickening and scattered airway impaction within the lower lobes bilaterally in keeping with airway inflammation and possibly the sequelae infectious bronchiolitis or aspiration. No pneumothorax or pleural effusion. UPPER ABDOMEN: Status post cholecystectomy. No acute abnormality within the visualized upper abdomen. Asymmetric scarring within the visualized upper pole of the left kidney. SOFT TISSUES AND BONES: Osseous structures are age appropriate. No acute bone abnormality. No lytic or blastic bone lesion. IMPRESSION: 1. Extensive acute pulmonary embolism involving the right main, lobar, and segmental pulmonary arteries and the  left lower lobar segmental pulmonary arteries. No CT evidence of right heart strain. 2. 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 Recommend annual imaging followup by CTA or MRA per 2010 ACCF/AHA/AATS/ACR/ASA/SCA/SCAI/SIR/STS/SVM guidelines. 3. Scattered ground-glass pulmonary infiltrate within the basilar right upper lobe and right lower lobe, possibly representing atelectasis or mild hemorrhagic infiltrate in the setting of pulmonary infarction. 4. Bronchial wall thickening and scattered airway impaction within the lower lobes bilaterally, consistent with airway inflammation and possibly the sequelae of infectious bronchiolitis or aspiration. Electronically signed by: Dorethia Molt MD 10/20/2023 08:43 PM EDT RP Workstation: [id number]   DG Chest 2 View Result Date: 10/20/2023 CLINICAL DATA:  Short of breath EXAM: CHEST - 2 VIEW COMPARISON:  None Available. FINDINGS: Frontal and lateral views of the chest demonstrate an unremarkable cardiac silhouette. No airspace disease, effusion, or pneumothorax. No acute bony abnormalities. IMPRESSION: 1. No acute  intrathoracic process. Electronically Signed   By: Ozell Daring M.D.   On: 10/20/2023 15:14    ASSESSMENT: DVT, bilateral pulmonary embolism.  PLAN:    DVT, bilateral pulmonary embolism: This is patient's second blood lifetime blood clot.  Previously approximately 6 years ago DVT was thought to be secondary to a transient risk factor of a long car ride.  Patient has no obvious transient risk factors currently.  Given the extensive nature of the bilateral pulmonary embolism as well as his history, I recommend patient continue Eliquis  lifelong.  Will do a full hypercoagulable workup today for completeness.  No further intervention is needed.  Return to clinic in 1 month with a video-assisted telemedicine visit to discuss his results.  I spent a total of 45 minutes reviewing chart data, face-to-face evaluation with the patient, counseling and coordination of care as detailed above.   Patient expressed understanding and was in agreement with this plan. He also understands that He can call clinic at any time with any questions, concerns, or complaints.     Evalene JINNY Reusing, MD   11/06/2023 12:16 PM

## 2023-11-07 LAB — BETA-2-GLYCOPROTEIN I ABS, IGG/M/A
Beta-2 Glyco I IgG: <9 GPI IgG units (ref 0–20)
Beta-2-Glycoprotein I IgA: <9 GPI IgA units (ref 0–25)
Beta-2-Glycoprotein I IgM: <9 GPI IgM units (ref 0–32)

## 2023-11-07 LAB — CARDIOLIPIN ANTIBODIES, IGG, IGM, IGA
Anticardiolipin IgA: <9 U/mL (ref 0–11)
Anticardiolipin IgG: <9 GPL U/mL (ref 0–14)
Anticardiolipin IgM: 9 [MPL'U]/mL (ref 0–12)

## 2023-11-07 LAB — PROTEIN C ACTIVITY: Protein C Activity: 114 % (ref 73–180)

## 2023-11-07 LAB — DRVVT CONFIRM: dRVVT Confirm: 1.3 ratio — ABNORMAL HIGH (ref 0.8–1.2)

## 2023-11-07 LAB — PROTEIN S ACTIVITY: Protein S Activity: 115 % (ref 63–140)

## 2023-11-07 LAB — PROTEIN S, TOTAL: Protein S Ag, Total: 115 % (ref 60–150)

## 2023-11-07 LAB — PTT-LA MIX: PTT-LA Mix: 41.8 s — ABNORMAL HIGH (ref 0.0–40.5)

## 2023-11-07 LAB — LUPUS ANTICOAGULANT PANEL
DRVVT: 91.1 s — ABNORMAL HIGH (ref 0.0–47.0)
PTT Lupus Anticoagulant: 46.7 s — ABNORMAL HIGH (ref 0.0–43.5)

## 2023-11-07 LAB — DRVVT MIX: dRVVT Mix: 57.9 s — ABNORMAL HIGH (ref 0.0–40.4)

## 2023-11-07 LAB — HEXAGONAL PHASE PHOSPHOLIPID: Hexagonal Phase Phospholipid: 3 s (ref 0–11)

## 2023-11-08 ENCOUNTER — Other Ambulatory Visit: Payer: Self-pay | Admitting: *Deleted

## 2023-11-08 DIAGNOSIS — N2889 Other specified disorders of kidney and ureter: Secondary | ICD-10-CM

## 2023-11-08 LAB — FACTOR 5 LEIDEN

## 2023-11-08 LAB — PROTEIN C, TOTAL: Protein C, Total: 104 % (ref 60–150)

## 2023-11-08 LAB — PROTHROMBIN GENE MUTATION

## 2023-11-12 ENCOUNTER — Other Ambulatory Visit

## 2023-11-12 DIAGNOSIS — R972 Elevated prostate specific antigen [PSA]: Secondary | ICD-10-CM

## 2023-11-13 ENCOUNTER — Ambulatory Visit: Admitting: Urology

## 2023-11-13 LAB — PSA: Prostate Specific Ag, Serum: 6.1 ng/mL — ABNORMAL HIGH (ref 0.0–4.0)

## 2023-11-15 ENCOUNTER — Ambulatory Visit: Admitting: Urology

## 2023-11-15 ENCOUNTER — Ambulatory Visit
Admission: RE | Admit: 2023-11-15 | Discharge: 2023-11-15 | Disposition: A | Discharge status: Home / Self Care | Source: Ambulatory Visit | Attending: Urology | Admitting: Urology

## 2023-11-15 ENCOUNTER — Ambulatory Visit: Payer: Self-pay | Admitting: Urology

## 2023-11-15 DIAGNOSIS — N2889 Other specified disorders of kidney and ureter: Secondary | ICD-10-CM | POA: Diagnosis present

## 2023-11-15 MED ORDER — GADOBUTROL 1 MMOL/ML IV SOLN
9.0000 mL | Freq: Once | INTRAVENOUS | Status: AC | PRN
  Administered 2023-11-15: 9 mL via INTRAVENOUS

## 2023-11-22 ENCOUNTER — Other Ambulatory Visit (INDEPENDENT_AMBULATORY_CARE_PROVIDER_SITE_OTHER): Payer: Self-pay | Admitting: Vascular Surgery

## 2023-11-22 DIAGNOSIS — I82432 Acute embolism and thrombosis of left popliteal vein: Secondary | ICD-10-CM

## 2023-11-23 ENCOUNTER — Ambulatory Visit (INDEPENDENT_AMBULATORY_CARE_PROVIDER_SITE_OTHER)

## 2023-11-23 ENCOUNTER — Encounter (INDEPENDENT_AMBULATORY_CARE_PROVIDER_SITE_OTHER): Payer: Self-pay | Admitting: Vascular Surgery

## 2023-11-23 ENCOUNTER — Ambulatory Visit (INDEPENDENT_AMBULATORY_CARE_PROVIDER_SITE_OTHER): Admitting: Vascular Surgery

## 2023-11-23 VITALS — BP 144/80 | HR 76 | Resp 18 | Ht 70.0 in | Wt 205.6 lb

## 2023-11-23 DIAGNOSIS — I10 Essential (primary) hypertension: Secondary | ICD-10-CM | POA: Diagnosis not present

## 2023-11-23 DIAGNOSIS — I82432 Acute embolism and thrombosis of left popliteal vein: Secondary | ICD-10-CM

## 2023-11-23 DIAGNOSIS — I2699 Other pulmonary embolism without acute cor pulmonale: Secondary | ICD-10-CM

## 2023-11-23 NOTE — Progress Notes (Unsigned)
 MRN : [id number]  Daniel Mason is a 78 y.o. (1945/07/01) male who presents with chief complaint of  Chief Complaint  Patient presents with   Follow-up    [facility] 4 week with dvt u/s  .  History of Present Illness: Patient returns today in follow up of ***  Current Outpatient Medications  Medication Sig Dispense Refill   albuterol  (VENTOLIN  HFA) 108 (90 Base) MCG/ACT inhaler Inhale 2 puffs into the lungs every 6 (six) hours as needed for wheezing or shortness of breath. 6.7 g 1   apixaban  (ELIQUIS ) 5 MG TABS tablet Take 2 tablets (10 mg total) by mouth 2 (two) times daily for 7 days, THEN 1 tablet (5 mg total) 2 (two) times daily. 60 tablet 3   Azelastine HCl 137 MCG/SPRAY SOLN Place 1 spray into both nostrils 2 (two) times daily.     bifidobacterium infantis (ALIGN) capsule Take 1 capsule by mouth daily.     celecoxib (CELEBREX) 200 MG capsule Take 200 mg by mouth daily.     doxylamine , Sleep, (UNISOM ) 25 MG tablet Take 25 mg by mouth at bedtime as needed.     fluticasone (FLONASE) 50 MCG/ACT nasal spray Place 1 spray into both nostrils daily.     levocetirizine (XYZAL) 5 MG tablet Take 5 mg by mouth at bedtime.     mepolizumab (NUCALA) 100 MG injection Inject 100 mg into the skin every 30 (thirty) days.      montelukast (SINGULAIR) 10 MG tablet Take 10 mg by mouth at bedtime.     Multiple Vitamin (MULTIVITAMIN WITH MINERALS) TABS tablet Take 1 tablet by mouth daily.     omeprazole (PRILOSEC) 40 MG capsule Take 40 mg by mouth at bedtime.     tamsulosin  (FLOMAX ) 0.4 MG CAPS capsule TAKE 1 CAPSULE BY MOUTH EVERYDAY AT BEDTIME 90 capsule 1   valsartan (DIOVAN) 40 MG tablet Take 40 mg by mouth every morning.     vardenafil  (LEVITRA ) 20 MG tablet 1 tablet 1 hour prior to intercourse as needed 12 tablet 3   No current facility-administered medications for this visit.    Past Medical History:  Diagnosis Date   Allergy    Arthritis    Bladder tumor    Chronic bilateral low back pain with  bilateral sciatica    Chronic cough    DVT (deep venous thrombosis) (HCC) 04/2017   was on blood thinners until hematuria.  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   L lat abdomen, excised in [place]  04/30/19   Dysplastic nevus 06/11/2023   mid to upper back spinal - severe, margins free but close, recheck in 6 mths   Dysplastic nevus 06/11/2023   mid to low back spinal - moderate   Eosinophilic asthma    Gallbladder disease    GERD (gastroesophageal reflux disease)    History of actinic keratoses 08/31/2022   left ear and preauricular bx proven treated already   History of Barrett's esophagus    History of BPH    History of cataract    History of kidney stones    bladder stones and 2 small kidney stones   History of pulmonary embolus (PE)    Hypertension    Thrombocytopenia    Tremor     Past Surgical History:  Procedure Laterality Date   CHOLECYSTECTOMY  2010   COLONOSCOPY WITH PROPOFOL  N/A 12/02/2020   Procedure: COLONOSCOPY WITH PROPOFOL ;  Surgeon: Onita Elspeth Sharper, DO;  Location: [facility] ENDOSCOPY;  Service: Gastroenterology;  Laterality: N/A;   CYSTOSCOPY WITH LITHOLAPAXY N/A 11/21/2018   Procedure: CYSTOSCOPY WITH LITHOLAPAXY;  Surgeon: Kassie Ozell SAUNDERS, MD;  Location: [facility] ORS;  Service: Urology;  Laterality: N/A;   ESOPHAGOGASTRODUODENOSCOPY (EGD) WITH PROPOFOL  N/A 12/02/2020   Procedure: ESOPHAGOGASTRODUODENOSCOPY (EGD) WITH PROPOFOL ;  Surgeon: Onita Elspeth Ozell, DO;  Location: [facility] ENDOSCOPY;  Service: Gastroenterology;  Laterality: N/A;   EYE SURGERY Bilateral 2001   cataract extractions   GREEN LIGHT LASER TURP (TRANSURETHRAL RESECTION OF PROSTATE N/A 11/21/2018   Procedure: GREEN LIGHT LASER TURP (TRANSURETHRAL RESECTION OF PROSTATE;  Surgeon: Kassie Ozell SAUNDERS, MD;  Location: [facility] ORS;  Service: Urology;  Laterality: N/A;   HERNIA REPAIR Left 2010   inguinal   NASAL SINUS SURGERY  1997, 2015   x 2   PROSTATE SURGERY  2001, 2011   .  2nd surgery  was a turp   PROSTATOTOMY  11/21/2018   PULMONARY THROMBECTOMY Bilateral 10/24/2023   Procedure: PULMONARY THROMBECTOMY;  Surgeon: Marea Selinda RAMAN, MD;  Location: [facility] INVASIVE CV LAB;  Service: Cardiovascular;  Laterality: Bilateral;   TOTAL HIP ARTHROPLASTY Left 2016   TRANSURETHRAL RESECTION OF PROSTATE  11/07/2022   Procedure: TRANSURETHRAL RESECTION OF THE PROSTATE (TURP);  Surgeon: Twylla Glendia BROCKS, MD;  Location: [facility] ORS;  Service: Urology;;     Social History   Tobacco Use   Smoking status: Never   Smokeless tobacco: Never  Vaping Use   Vaping status: Never Used  Substance Use Topics   Alcohol use: Yes    Comment: occ   Drug use: Never   ***    No family history on file. ***  Allergies  Allergen Reactions   Iodine  Shortness Of Breath and Cough    Intravenous iodine .  No problem with betadine   Sulfa Antibiotics Hives and Swelling     REVIEW OF SYSTEMS (Negative unless checked)  Constitutional: [] Weight loss  [] Fever  [] Chills Cardiac: [] Chest pain   [] Chest pressure   [] Palpitations   [] Shortness of breath when laying flat   [] Shortness of breath at rest   [] Shortness of breath with exertion. Vascular:  [] Pain in legs with walking   [] Pain in legs at rest   [] Pain in legs when laying flat   [] Claudication   [] Pain in feet when walking  [] Pain in feet at rest  [] Pain in feet when laying flat   [] History of DVT   [] Phlebitis   [] Swelling in legs   [] Varicose veins   [] Non-healing ulcers Pulmonary:   [] Uses home oxygen   [] Productive cough   [] Hemoptysis   [] Wheeze  [] COPD   [] Asthma Neurologic:  [] Dizziness  [] Blackouts   [] Seizures   [] History of stroke   [] History of TIA  [] Aphasia   [] Temporary blindness   [] Dysphagia   [] Weakness or numbness in arms   [] Weakness or numbness in legs Musculoskeletal:  [] Arthritis   [] Joint swelling   [] Joint pain   [] Low back pain Hematologic:  [] Easy bruising  [] Easy bleeding   [] Hypercoagulable state   [] Anemic   Gastrointestinal:   [] Blood in stool   [] Vomiting blood  [] Gastroesophageal reflux/heartburn   [] Abdominal pain Genitourinary:  [] Chronic kidney disease   [] Difficult urination  [] Frequent urination  [] Burning with urination   [] Hematuria Skin:  [] Rashes   [] Ulcers   [] Wounds Psychological:  [] History of anxiety   []  History of major depression.  Physical Examination  BP (!) 144/80   Pulse 76   Resp 18   Ht 5' 10 (  1.778 m)   Wt 205 lb 9.6 oz (93.3 kg)   BMI 29.50 kg/m  Gen:  WD/WN, NAD Head: [place]/AT, No temporalis wasting. Ear/Nose/Throat: Hearing grossly intact, nares w/o erythema or drainage Eyes: Conjunctiva clear. Sclera non-icteric Neck: Supple.  Trachea midline Pulmonary:  Good air movement, no use of accessory muscles.  Cardiac: RRR, no JVD Vascular: *** Vessel Right Left  Radial Palpable Palpable                          PT Palpable Palpable  DP Palpable Palpable   Gastrointestinal: soft, non-tender/non-distended. No guarding/reflex.  Musculoskeletal: M/S 5/5 throughout.  No deformity or atrophy. *** edema. Neurologic: Sensation grossly intact in extremities.  Symmetrical.  Speech is fluent.  Psychiatric: Judgment intact, Mood & affect appropriate for pt's clinical situation. Dermatologic: No rashes or ulcers noted.  No cellulitis or open wounds.      Labs Recent Results (from the past 2160 hours)  Basic metabolic panel     Status: Abnormal   Collection Time: 10/20/23  1:59 PM  Result Value Ref Range   Sodium 140 135 - 145 mmol/L   Potassium 3.7 3.5 - 5.1 mmol/L   Chloride 108 98 - 111 mmol/L   CO2 19 (L) 22 - 32 mmol/L   Glucose, Bld 86 70 - 99 mg/dL    Comment: Glucose reference range applies only to samples taken after fasting for at least 8 hours.   BUN 15 8 - 23 mg/dL   Creatinine, Ser 8.80 0.61 - 1.24 mg/dL   Calcium 8.7 (L) 8.9 - 10.3 mg/dL   GFR, Estimated >39 >39 mL/min    Comment: (NOTE) Calculated using the CKD-EPI Creatinine Equation (2021)    Anion gap 13  5 - 15    Comment: Performed at [facility], [address] [place], [place] [address]  CBC     Status: Abnormal   Collection Time: 10/20/23  1:59 PM  Result Value Ref Range   WBC 11.2 (H) 4.0 - 10.5 K/uL   RBC 5.38 4.22 - 5.81 MIL/uL   Hemoglobin 16.1 13.0 - 17.0 g/dL   HCT 53.1 60.9 - 47.9 %   MCV 87.0 80.0 - 100.0 fL   MCH 29.9 26.0 - 34.0 pg   MCHC 34.4 30.0 - 36.0 g/dL   RDW 86.9 88.4 - 84.4 %   Platelets 159 150 - 400 K/uL   nRBC 0.0 0.0 - 0.2 %    Comment: Performed at [facility], [address], [place], [place] [address]  Troponin I (High Sensitivity)     Status: Abnormal   Collection Time: 10/20/23  1:59 PM  Result Value Ref Range   Troponin I (High Sensitivity) 27 (H) <18 ng/L    Comment: (NOTE) Elevated high sensitivity troponin I (hsTnI) values and significant  changes across serial measurements may suggest ACS but many other  chronic and acute conditions are known to elevate hsTnI results.  Refer to the Links section for chest pain algorithms and additional  guidance. Performed at [facility], [address] [place], [place] [address]   Brain natriuretic peptide     Status: Abnormal   Collection Time: 10/20/23  1:59 PM  Result Value Ref Range   B Natriuretic Peptide 131.0 (H) 0.0 - 100.0 pg/mL    Comment: Performed at [facility], [address] [place], [place] [address]  Troponin I (High Sensitivity)     Status: Abnormal  Collection Time: 10/20/23  4:13 PM  Result Value Ref Range   Troponin I (High Sensitivity) 32 (H) <18 ng/L    Comment: (NOTE) Elevated high sensitivity troponin I (hsTnI) values and significant  changes across serial measurements may suggest ACS but many other  chronic and acute conditions are known to elevate hsTnI results.  Refer to the Links section for chest pain algorithms and additional  guidance. Performed at [facility], [address] [place], [place] [address]    APTT     Status: None   Collection Time: 10/20/23  9:04 PM  Result Value Ref Range   aPTT 34 24 - 36 seconds    Comment: Performed at [facility], [address] [place], [place] [address]  Protime-INR     Status: None   Collection Time: 10/20/23  9:04 PM  Result Value Ref Range   Prothrombin Time 14.0 11.4 - 15.2 seconds   INR 1.0 0.8 - 1.2    Comment: (NOTE) INR goal varies based on device and disease states. Performed at [facility], [address] [place], [place] [address]   CBG monitoring, ED     Status: Abnormal   Collection Time: 10/21/23  5:03 AM  Result Value Ref Range   Glucose-Capillary 159 (H) 70 - 99 mg/dL    Comment: Glucose reference range applies only to samples taken after fasting for at least 8 hours.  Heparin  level (unfractionated)     Status: Abnormal   Collection Time: 10/21/23  7:16 AM  Result Value Ref Range   Heparin  Unfractionated 0.95 (H) 0.30 - 0.70 IU/mL    Comment: (NOTE) The clinical reportable range upper limit is being lowered to >1.10 to align with the FDA approved guidance for the current laboratory assay.  If heparin  results are below expected values, and patient dosage has  been confirmed, suggest follow up testing of antithrombin III levels. Performed at [facility], [address] [place], [place] [address]   CBC     Status: None   Collection Time: 10/21/23  7:16 AM  Result Value Ref Range   WBC 9.8 4.0 - 10.5 K/uL   RBC 5.03 4.22 - 5.81 MIL/uL   Hemoglobin 15.1 13.0 - 17.0 g/dL   HCT 57.3 60.9 - 47.9 %   MCV 84.7 80.0 - 100.0 fL   MCH 30.0 26.0 - 34.0 pg   MCHC 35.4 30.0 - 36.0 g/dL   RDW 86.7 88.4 - 84.4 %   Platelets 166 150 - 400 K/uL   nRBC 0.0 0.0 - 0.2 %    Comment: Performed at [facility], [address] [place], [place] [address]  Heparin  level (unfractionated)     Status: None   Collection Time: 10/21/23  5:00 PM  Result Value Ref Range   Heparin  Unfractionated 0.63  0.30 - 0.70 IU/mL    Comment: (NOTE) The clinical reportable range upper limit is being lowered to >1.10 to align with the FDA approved guidance for the current laboratory assay.  If heparin  results are below expected values, and patient dosage has  been confirmed, suggest follow up testing of antithrombin III levels. Performed at [facility], [address] [place], [place] [address]   Heparin  level (unfractionated)     Status: None   Collection Time: 10/22/23 12:35 AM  Result Value Ref Range   Heparin  Unfractionated 0.59 0.30 - 0.70 IU/mL    Comment: (NOTE) The clinical reportable range upper limit is being  lowered to >1.10 to align with the FDA approved guidance for the current laboratory assay.  If heparin  results are below expected values, and patient dosage has  been confirmed, suggest follow up testing of antithrombin III levels. Performed at [facility], [address] [place], [place] [address]   CBC with Differential/Platelet     Status: Abnormal   Collection Time: 10/22/23  3:14 AM  Result Value Ref Range   WBC 9.7 4.0 - 10.5 K/uL   RBC 5.09 4.22 - 5.81 MIL/uL   Hemoglobin 15.3 13.0 - 17.0 g/dL   HCT 56.0 60.9 - 47.9 %   MCV 86.2 80.0 - 100.0 fL   MCH 30.1 26.0 - 34.0 pg   MCHC 34.9 30.0 - 36.0 g/dL   RDW 86.5 88.4 - 84.4 %   Platelets 136 (L) 150 - 400 K/uL   nRBC 0.0 0.0 - 0.2 %   Neutrophils Relative % 63 %   Neutro Abs 6.2 1.7 - 7.7 K/uL   Lymphocytes Relative 27 %   Lymphs Abs 2.6 0.7 - 4.0 K/uL   Monocytes Relative 7 %   Monocytes Absolute 0.7 0.1 - 1.0 K/uL   Eosinophils Relative 1 %   Eosinophils Absolute 0.1 0.0 - 0.5 K/uL   Basophils Relative 1 %   Basophils Absolute 0.1 0.0 - 0.1 K/uL   Immature Granulocytes 1 %   Abs Immature Granulocytes 0.07 0.00 - 0.07 K/uL    Comment: Performed at [facility], [address], [place], [place] [address]  Basic metabolic panel     Status: Abnormal   Collection Time:  10/22/23  3:14 AM  Result Value Ref Range   Sodium 141 135 - 145 mmol/L   Potassium 4.0 3.5 - 5.1 mmol/L   Chloride 111 98 - 111 mmol/L   CO2 20 (L) 22 - 32 mmol/L   Glucose, Bld 90 70 - 99 mg/dL    Comment: Glucose reference range applies only to samples taken after fasting for at least 8 hours.   BUN 20 8 - 23 mg/dL   Creatinine, Ser 8.81 0.61 - 1.24 mg/dL   Calcium 8.2 (L) 8.9 - 10.3 mg/dL   GFR, Estimated >39 >39 mL/min    Comment: (NOTE) Calculated using the CKD-EPI Creatinine Equation (2021)    Anion gap 10 5 - 15    Comment: Performed at [facility], [address] [place], [place] [address]  Glucose, capillary     Status: Abnormal   Collection Time: 10/22/23  4:51 AM  Result Value Ref Range   Glucose-Capillary 118 (H) 70 - 99 mg/dL    Comment: Glucose reference range applies only to samples taken after fasting for at least 8 hours.  ECHOCARDIOGRAM COMPLETE     Status: None   Collection Time: 10/22/23  8:07 AM  Result Value Ref Range   Weight 3,264 oz   Height 70 in   BP 182/93 mmHg   S' Lateral 4.28 cm   Area-P 1/2 2.25 cm2   Est EF 50 - 55%   Glucose, capillary     Status: None   Collection Time: 10/22/23 11:57 AM  Result Value Ref Range   Glucose-Capillary 95 70 - 99 mg/dL    Comment: Glucose reference range applies only to samples taken after fasting for at least 8 hours.  Glucose, capillary     Status: None   Collection Time: 10/22/23  4:28 PM  Result Value Ref Range   Glucose-Capillary 91 70 -  99 mg/dL    Comment: Glucose reference range applies only to samples taken after fasting for at least 8 hours.  Glucose, capillary     Status: Abnormal   Collection Time: 10/23/23  5:44 AM  Result Value Ref Range   Glucose-Capillary 106 (H) 70 - 99 mg/dL    Comment: Glucose reference range applies only to samples taken after fasting for at least 8 hours.  CBC with Differential/Platelet     Status: Abnormal   Collection Time: 10/23/23  6:19 AM  Result  Value Ref Range   WBC 8.1 4.0 - 10.5 K/uL   RBC 5.57 4.22 - 5.81 MIL/uL   Hemoglobin 16.8 13.0 - 17.0 g/dL   HCT 52.6 60.9 - 47.9 %   MCV 84.9 80.0 - 100.0 fL   MCH 30.2 26.0 - 34.0 pg   MCHC 35.5 30.0 - 36.0 g/dL   RDW 86.7 88.4 - 84.4 %   Platelets 136 (L) 150 - 400 K/uL    Comment: REPEATED TO VERIFY   nRBC 0.0 0.0 - 0.2 %   Neutrophils Relative % 63 %   Neutro Abs 5.2 1.7 - 7.7 K/uL   Lymphocytes Relative 26 %   Lymphs Abs 2.1 0.7 - 4.0 K/uL   Monocytes Relative 8 %   Monocytes Absolute 0.6 0.1 - 1.0 K/uL   Eosinophils Relative 1 %   Eosinophils Absolute 0.1 0.0 - 0.5 K/uL   Basophils Relative 1 %   Basophils Absolute 0.1 0.0 - 0.1 K/uL   Immature Granulocytes 1 %   Abs Immature Granulocytes 0.04 0.00 - 0.07 K/uL    Comment: Performed at [facility], [address], [place], [place] [address]  Basic metabolic panel     Status: Abnormal   Collection Time: 10/23/23  6:19 AM  Result Value Ref Range   Sodium 140 135 - 145 mmol/L   Potassium 3.9 3.5 - 5.1 mmol/L   Chloride 110 98 - 111 mmol/L   CO2 22 22 - 32 mmol/L   Glucose, Bld 104 (H) 70 - 99 mg/dL    Comment: Glucose reference range applies only to samples taken after fasting for at least 8 hours.   BUN 18 8 - 23 mg/dL   Creatinine, Ser 8.97 0.61 - 1.24 mg/dL   Calcium 8.5 (L) 8.9 - 10.3 mg/dL   GFR, Estimated >39 >39 mL/min    Comment: (NOTE) Calculated using the CKD-EPI Creatinine Equation (2021)    Anion gap 8 5 - 15    Comment: Performed at [facility], [address] [place], [place] [address]  Heparin  level (unfractionated)     Status: None   Collection Time: 10/23/23  6:19 AM  Result Value Ref Range   Heparin  Unfractionated 0.47 0.30 - 0.70 IU/mL    Comment: (NOTE) The clinical reportable range upper limit is being lowered to >1.10 to align with the FDA approved guidance for the current laboratory assay.  If heparin  results are below expected values, and patient dosage has  been  confirmed, suggest follow up testing of antithrombin III levels. Performed at [facility], [address] [place], [place] [address]   Magnesium     Status: None   Collection Time: 10/23/23  6:19 AM  Result Value Ref Range   Magnesium 2.0 1.7 - 2.4 mg/dL    Comment: Performed at [facility], [address], [place], [place] [address]  CBC with Differential/Platelet     Status: None   Collection  Time: 10/24/23  3:48 AM  Result Value Ref Range   WBC 8.4 4.0 - 10.5 K/uL   RBC 5.41 4.22 - 5.81 MIL/uL   Hemoglobin 15.9 13.0 - 17.0 g/dL   HCT 53.1 60.9 - 47.9 %   MCV 86.5 80.0 - 100.0 fL   MCH 29.4 26.0 - 34.0 pg   MCHC 34.0 30.0 - 36.0 g/dL   RDW 86.7 88.4 - 84.4 %   Platelets 158 150 - 400 K/uL   nRBC 0.0 0.0 - 0.2 %   Neutrophils Relative % 61 %   Neutro Abs 5.1 1.7 - 7.7 K/uL   Lymphocytes Relative 29 %   Lymphs Abs 2.4 0.7 - 4.0 K/uL   Monocytes Relative 7 %   Monocytes Absolute 0.6 0.1 - 1.0 K/uL   Eosinophils Relative 1 %   Eosinophils Absolute 0.1 0.0 - 0.5 K/uL   Basophils Relative 1 %   Basophils Absolute 0.1 0.0 - 0.1 K/uL   Immature Granulocytes 1 %   Abs Immature Granulocytes 0.06 0.00 - 0.07 K/uL    Comment: Performed at [facility], [address] [place], [place] [address]  Basic metabolic panel     Status: Abnormal   Collection Time: 10/24/23  3:48 AM  Result Value Ref Range   Sodium 140 135 - 145 mmol/L   Potassium 3.5 3.5 - 5.1 mmol/L   Chloride 107 98 - 111 mmol/L   CO2 24 22 - 32 mmol/L   Glucose, Bld 105 (H) 70 - 99 mg/dL    Comment: Glucose reference range applies only to samples taken after fasting for at least 8 hours.   BUN 17 8 - 23 mg/dL   Creatinine, Ser 8.86 0.61 - 1.24 mg/dL   Calcium 8.5 (L) 8.9 - 10.3 mg/dL   GFR, Estimated >39 >39 mL/min    Comment: (NOTE) Calculated using the CKD-EPI Creatinine Equation (2021)    Anion gap 9 5 - 15    Comment: Performed at [facility], [address]  [place], [place] [address]  Heparin  level (unfractionated)     Status: None   Collection Time: 10/24/23  3:48 AM  Result Value Ref Range   Heparin  Unfractionated 0.38 0.30 - 0.70 IU/mL    Comment: (NOTE) The clinical reportable range upper limit is being lowered to >1.10 to align with the FDA approved guidance for the current laboratory assay.  If heparin  results are below expected values, and patient dosage has  been confirmed, suggest follow up testing of antithrombin III levels. Performed at [facility], [address] [place], [place] [address]   Glucose, capillary     Status: Abnormal   Collection Time: 10/24/23  5:01 AM  Result Value Ref Range   Glucose-Capillary 109 (H) 70 - 99 mg/dL    Comment: Glucose reference range applies only to samples taken after fasting for at least 8 hours.  Heparin  level (unfractionated)     Status: None   Collection Time: 10/25/23  3:23 AM  Result Value Ref Range   Heparin  Unfractionated 0.42 0.30 - 0.70 IU/mL    Comment: (NOTE) The clinical reportable range upper limit is being lowered to >1.10 to align with the FDA approved guidance for the current laboratory assay.  If heparin  results are below expected values, and patient dosage has  been confirmed, suggest follow up testing of antithrombin III levels. Performed at [facility], [address] [place], [place] [address]   CBC  Status: Abnormal   Collection Time: 10/25/23  3:23 AM  Result Value Ref Range   WBC 12.2 (H) 4.0 - 10.5 K/uL   RBC 4.96 4.22 - 5.81 MIL/uL   Hemoglobin 14.8 13.0 - 17.0 g/dL   HCT 57.4 60.9 - 47.9 %   MCV 85.7 80.0 - 100.0 fL   MCH 29.8 26.0 - 34.0 pg   MCHC 34.8 30.0 - 36.0 g/dL   RDW 87.0 88.4 - 84.4 %   Platelets 150 150 - 400 K/uL   nRBC 0.0 0.0 - 0.2 %    Comment: Performed at [facility], [address] [place], [place] [address]  Glucose, capillary     Status: Abnormal   Collection Time: 10/25/23  5:42 AM  Result  Value Ref Range   Glucose-Capillary 152 (H) 70 - 99 mg/dL    Comment: Glucose reference range applies only to samples taken after fasting for at least 8 hours.  Beta-2-glycoprotein i abs, IgG/M/A     Status: None   Collection Time: 11/05/23 12:25 PM  Result Value Ref Range   Beta-2 Glyco I IgG <9 0 - 20 GPI IgG units   Beta-2-Glycoprotein I IgM <9 0 - 32 GPI IgM units    Comment: (NOTE) Performed At: [facility] [address] [place], [place] [id number] Jennette Shorter MD Ey:[id number]    Beta-2-Glycoprotein I IgA <9 0 - 25 GPI IgA units  Prothrombin gene mutation (Factor 2 Mutation)     Status: None   Collection Time: 11/05/23 12:25 PM  Result Value Ref Range   Recommendations-PTGENE: Comment     Comment: (NOTE) Result: c.*97G>A - Not Detected This result is not associated with an increased risk for venous thromboembolism. See Additional Clinical Information and Comments. Additional Clinical Information: Venous thromboembolism is a multifactorial disease influenced by genetic, environmental, and circumstantial risk factors. The c.*97G>A variant in the F2 gene is a genetic risk factor for venous thromboembolism. Heterozygous carriers have a 2- to 4-fold increased risk for venous thromboembolism. Homozygotes for the c.*97G>A variant are rare. The annual risk of VTE in homozygotes has been reported to be 1.1%/year. Individuals who carry both a c.*97G>A variant in the F2 gene and a c.1601G>A (p. Arg534Gln) variant in the F5 gene (commonly referred to as Factor V Leiden) have an approximately 20- fold increased risk for venous thromboembolism. Risks are likely to be even higher in more complex genotype combinations involving the F2 c.*97G>A variant and Factor V Leiden (PMID:  [id number]). Additional risk factors include but are not limited to: deficiency of protein C, protein S, or antithrombin III, age, male sex, personal or family history of deep vein thromboembolism,  smoking, surgery, prolonged immobilization, malignant neoplasm, tamoxifen treatment, raloxifene treatment, oral contraceptive use, hormone replacement therapy, and pregnancy. Management of thrombotic risk and thrombotic events should follow established guidelines and fit the clinical circumstance. This result cannot predict the occurrence or recurrence of a thrombotic event. Comments: Genetic counseling is recommended to discuss the potential clinical implications of positive results, as well as recommendations for testing family members. Genetic Coordinators are available for health care providers to discuss results at [phone]). Test Details: Variant analyzed: c.*97G>A, previously referred to as G20210A Methods/Limitations: DNA analysis of the F2 gene (NM_000 506.5) was performed by PCR amplification followed by restriction enzyme analysis. The diagnostic sensitivity is >99%. Results must be combined with clinical information for the most accurate interpretation. Molecular-based testing is highly accurate, but as in any laboratory test, diagnostic errors may  occur. False positive or false negative results may occur for reasons that include genetic variants, blood transfusions, bone marrow transplantation, somatic or tissue-specific mosaicism, mislabeled samples, or erroneous representation of family relationships. This test was developed and its performance characteristics determined by [facility]. It has not been cleared or approved by the Food and Drug Administration. References: Bhatt S, Taylor AK, Lozano R, [facility], [facility]; [facility]G Professional Practice and Guidelines Committee. Addendum: [employer] consensus statement on factor V Leiden mutation testing. Genet Med. 2021 Mar 5. doi: 89.8961/d585 [phone]-[id number]. PMID: 66325232. Hosey RUSH. Prothrombin Thrombophilia. 2006 Jul 25 [Updated 2021 Feb 4]. In: Juliene POSNER, Ardinger HH, Pagon RA, et  al., editors. GeneReviews(R) [Internet]. [address] [facility]): [place] , [place]; 8006-7978. Available from: [email or web address].com/ Laurita GORMAN Waddell BRIDGETT, Huang X, Luo B, Spector EB, Ileana SHAUNNA Gal CS; [employer] Laboratory Quality Assurance Committee. Venous thromboembolism laboratory testing (factor V Leiden and factor II c.*97G>A), 2018 update: a technical standard of the [employer] of [employer] and [employer] ([facility]). Genet Med. 2018 Dec;20(12):1489-1498. doi: 10.1038/[id number]-z. Epub 2018 Oct 5. PMID: 69702301.    Reviewed by: Comment     Comment: (NOTE) Technical Component performed at [facility] Professional Component performed by: Larose Agent, PhD, [facility] [id number], [facility], [address] [place] [address] Performed At: [facility] [address] [place], [place] [id number] Loran Gales MDPhD Ey:[id number]   Factor 5 leiden     Status: None   Collection Time: 11/05/23 12:25 PM  Result Value Ref Range   Recommendations-F5LEID: Comment     Comment: (NOTE) Result: c.1601G>A (p.Arg534Gln) - Not Detected This result is not associated with an increased risk for venous thromboembolism. See Additional Clinical Information and Comments. Additional Clinical Information:    Venous thromboembolism is a multifactorial disease influenced by genetic, environmental, and circumstantial risk factors. The c.1601G>A (p. Arg534Gln) variant in the F5 gene, commonly referred to as Factor V Leiden, is a genetic risk factor for venous thromboembolism. Heterozygous carriers of this variant have a 6- to 8- fold increased risk for venous thromboembolism. Individuals homozygous for this variant (ie, with a copy of the variant on each chromosome) have an approximately 80-fold increased risk for venous thromboembolism. Individuals who carry both a c.*97G>A variant in the F2 gene and Factor V Leiden have an approximately 20-fold increased risk for venous  thromboembolism. Risks are likely to be even higher in more complex genotype combinations  involving the F2 c.*97G>A variant and Factor V Leiden (PMID: 66325232). Additional risk factors include but are not limited to: deficiency of protein C, protein S, or antithrombin III, age, male sex, personal or family history of deep vein thromboembolism, smoking, surgery, prolonged immobilization, malignant neoplasm, tamoxifen treatment, raloxifene treatment, oral contraceptive use, hormone replacement therapy, and pregnancy. Management of thrombotic risk and thrombotic events should follow established guidelines and fit the clinical circumstance. This result cannot predict the occurrence or recurrence of a thrombotic event. Comment:    Genetic counseling is recommended to discuss the potential clinical implications of positive results, as well as recommendations for testing family members.    Genetic Coordinators are available for health care providers to discuss results at [phone]). Test Details:    Variant Analyzed: c.1601G>A (p. Arg534Gln), referre d to as Factor V Leiden Methods/Limitations:    DNA analysis of the F5 gene (NM_000130.5) was performed by PCR amplification followed by electrophoresis. The diagnostic sensitivity is >99%. Results must be combined with clinical information for the most accurate interpretation. Molecular-based  testing is highly accurate, but as in any laboratory test, diagnostic errors may occur. False positive or false negative results may occur for reasons that include genetic variants, blood transfusions, bone marrow transplantation, somatic or tissue-specific mosaicism, mislabeled samples, or erroneous representation of family relationships.    This test was developed and its performance characteristics determined by [facility]. It has not been cleared or approved by the Food and Drug Administration. References:    Bhatt S, Taylor AK, Lozano R,  [facility], [facility]; ACMG Professional Practice and Guidelines Committee. Addendum: [employer] of [employer] consensus statemen t on factor V Leiden mutation testing. Genet Med. 2021 Mar 5. doi: 89.8961/d58563-978- 01108-x. PMID: 66325232.    Hosey RUSH. Factor V Leiden Thrombophilia. 1999 May 14 (Updated 2018 Jan 4). In: Juliene POSNER, Ardinger HH, Pagon RA, et al., editors. GeneReviews(R) (Internet). [address] ([place]): [place] , [place]; 8006-7978. Available from: [email or web address]/    Laurita GORMAN Waddell BRIDGETT, Huang X, Luo B, Spector EB, Ileana SHAUNNA Gal CS; A[employer]G Laboratory Quality Assurance Committee. Venous thromboembolism laboratory testing (factor V Leiden and factor II c. *97G>A), 2018 update: a technical standard of the [employer] ([facility]MG). Genet Med. 2018 Izr;79(87): 8510-8501. doi: 10.1038/[id number]-z. Epub 2018 Oct 5. PMID: 69702301.    Reviewed By: Comment     Comment: (NOTE) Technical Component performed at [facility] Professional Component performed by: W. Wanda Norse, PhD, [facility], [facility], [address] [place] [address] Performed At: [facility] [address] [place], [place] [id number] Loran Gales MDPhD Ey:[id number]   Protein S activity     Status: None   Collection Time: 11/05/23 12:25 PM  Result Value Ref Range   Protein S Activity 115 63 - 140 %    Comment: (NOTE) Protein S activity may be falsely increased (masking an abnormal, low result) in patients receiving direct Xa inhibitor (e.g., rivaroxaban, apixaban , edoxaban) or a direct thrombin inhibitor (e.g., dabigatran) anticoagulant treatment due to assay interference by these drugs. Performed At: [facility] [address] [place], [place] [id number] Jennette Shorter MD Ey:[phone]   Antithrombin III     Status: None   Collection Time: 11/05/23 12:25 PM  Result Value Ref Range   AntiThromb  III Func 109 75 - 120 %    Comment: Performed at [facility], [address] [place], [place] [address]  Protein S, total     Status: None   Collection Time: 11/05/23 12:25 PM  Result Value Ref Range   Protein S Ag, Total 115 60 - 150 %    Comment: (NOTE) This test was developed and its performance characteristics determined by [facility]. It has not been cleared or approved by the Food and Drug Administration. Performed At: [facility] [address] [place], [place] [id number] Jennette Shorter MD Ey:[phone]   Protein C, total     Status: None   Collection Time: 11/05/23 12:25 PM  Result Value Ref Range   Protein C, Total 104 60 - 150 %    Comment: (NOTE) Performed At: [facility] [address] [place], [place] [id number] Jennette Shorter MD Ey:[phone]   Protein C activity     Status: None   Collection Time: 11/05/23 12:25 PM  Result Value Ref Range   Protein C Activity 114 73 - 180 %    Comment: (NOTE) Performed At: [facility] [address] [place], [place] [id number] Jennette Shorter MD Ey:[id number]   Lupus anticoagulant panel  Status: Abnormal   Collection Time: 11/05/23 12:25 PM  Result Value Ref Range   PTT Lupus Anticoagulant 46.7 (H) 0.0 - 43.5 sec   DRVVT 91.1 (H) 0.0 - 47.0 sec   Lupus Anticoag Interp Comment:     Comment: (NOTE) Results are consistent with the presence of a lupus anticoagulant. As only persistent lupus anticoagulant (LA) positivity meets laboratory diagnostic criteria for antiphospholipid syndrome, repeat testing in 12 or more weeks is recommended, ideally in the absence of anticoagulant therapy. Important Note: The results of LA testing are not valid for patients receiving heparin , direct Xa inhibitor (e.g., rivaroxaban, apixaban ) or direct thrombin inhibitor (e.g., dabigatran) therapy. These drugs may cause false positive LA results but will not interfere with anticardiolipin and beta-2  glycoprotein 1 antibody testing. Performed At: [facility] [address] [place], [place] [id number] Jennette Shorter MD Ey:[phone]   Cardiolipin antibodies, IgG, IgM, IgA     Status: None   Collection Time: 11/05/23 12:25 PM  Result Value Ref Range   Anticardiolipin IgG <9 0 - 14 GPL U/mL    Comment: (NOTE)                          Negative:              <15                          Indeterminate:     15 - 20                          Low-Med Positive: >20 - 80                          High Positive:         >80    Anticardiolipin IgM 9 0 - 12 MPL U/mL    Comment: (NOTE)                          Negative:              <13                          Indeterminate:     13 - 20                          Low-Med Positive: >20 - 80                          High Positive:         >80    Anticardiolipin IgA <9 0 - 11 APL U/mL    Comment: (NOTE)                          Negative:              <12                          Indeterminate:     12 - 20                          Low-Med Positive: >20 -  80                          High Positive:         >80 Performed At: [facility] [address] [place], [place] [id number] Jennette Shorter MD Ey:[phone]   PTT-LA Mix     Status: Abnormal   Collection Time: 11/05/23 12:25 PM  Result Value Ref Range   PTT-LA Mix 41.8 (H) 0.0 - 40.5 sec    Comment: (NOTE) Performed At: [facility] [address] [place] [address] [place], [place] [id number] Jennette Shorter MD Ey:[phone]   Hexagonal Phase Phospholipid     Status: None   Collection Time: 11/05/23 12:25 PM  Result Value Ref Range   Hexagonal Phase Phospholipid 3 0 - 11 sec    Comment: (NOTE) Performed At: [facility] [address] [place], [place] [id number] Jennette Shorter MD Ey:[phone]   dRVVT Mix     Status: Abnormal   Collection Time: 11/05/23 12:25 PM  Result Value Ref Range   dRVVT Mix 57.9 (H) 0.0 - 40.4 sec    Comment: (NOTE) Performed At: [facility] [address] [place], [place] [id number] Jennette Shorter MD Ey:[phone]   dRVVT Confirm     Status: Abnormal   Collection Time: 11/05/23 12:25 PM  Result Value Ref Range   dRVVT Confirm 1.3 (H) 0.8 - 1.2 ratio    Comment: (NOTE) Performed At: [facility] [address] [place], [place] [id number] Jennette Shorter MD Ey:[id number]   PSA     Status: Abnormal   Collection Time: 11/12/23 11:02 AM  Result Value Ref Range   Prostate Specific Ag, Serum 6.1 (H) 0.0 - 4.0 ng/mL    Comment: Roche ECLIA methodology. According to the American Urological Association, Serum PSA should decrease and remain at undetectable levels after radical prostatectomy. The AUA defines biochemical recurrence as an initial PSA value 0.2 ng/mL or greater followed by a subsequent confirmatory PSA value 0.2 ng/mL or greater. Values obtained with different assay methods or kits cannot be used interchangeably. Results cannot be interpreted as absolute evidence of the presence or absence of malignant disease.     Radiology MR Abdomen W Wo Contrast Result Date: 11/15/2023 CLINICAL DATA:  Renal mass. EXAM: MRI ABDOMEN WITHOUT AND WITH CONTRAST TECHNIQUE: Multiplanar multisequence MR imaging of the abdomen was performed both before and after the administration of intravenous contrast. CONTRAST:  9mL GADAVIST  GADOBUTROL  1 MMOL/ML IV SOLN COMPARISON:  MRI abdomen from 09/04/2022. FINDINGS: Lower chest: Unremarkable MR appearance to the lung bases. No pleural effusion. No pericardial effusion. Normal heart size. Hepatobiliary: Liver is normal in size. Noncirrhotic configuration. There is a stable subcentimeter simple cyst in the subcapsular left hepatic lobe, segment 2. No suspicious liver lesion. No intrahepatic or extrahepatic bile duct dilatation. No choledocholithiasis. Unremarkable gallbladder. Pancreas: There is marked atrophic/fatty replacement of pancreas. There are at least 3, T2 hyperintense,  nonenhancing structures in the pancreatic head/uncinate process (marked with electronic arrow sign on series 4), with largest lesion in the uncinate process measuring up to 14 x 18 mm. There is no direct communication of the lesion with pancreatic main duct or side branches. However, no significant interval change since the prior study. 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 Continued follow-up is recommended. No new suspicious pancreatic lesion. No peripancreatic fat stranding. Main pancreatic duct is nondilated. Spleen: Within normal limits in size and appearance. No focal mass. Adrenals/Urinary Tract: Unremarkable adrenal glands.  No hydroureteronephrosis on either side. Redemonstration of previously seen focal renal lesions, which are described as follows: *There is a T1 hypointense and T2 heterogeneous mildly hyperintense lesion arising from the left kidney upper pole, posteriorly measuring 13 x 14 mm. The lesion exhibits enhancement on the postcontrast images. No significant interval change when compared to the prior exam. This is compatible with neoplasm. *There is a predominantly exophytic 7 x 10 mm lesion arising from the left kidney lower pole, posterolaterally which appears T1 hypointense and T2 hypointense. The lesion exhibits moderate enhancement and is also favored to represent new neoplasm. No significant interval change in size. *There is a 7 x 8 mm predominantly exophytic lesion arising from the right kidney lower pole, posteromedially which also enhances and is favored to represent neoplasm. No significant interval change in size. No new suspicious renal lesion. Redemonstration of focal scarring in the left kidney upper pole, laterally. Stomach/Bowel: Visualized portions within the abdomen are unremarkable. No disproportionate dilation of bowel loops. Vascular/Lymphatic: No pathologically enlarged lymph nodes identified. No abdominal aortic aneurysm demonstrated. No  ascites. Other:  None. Musculoskeletal: No suspicious bone lesions identified. IMPRESSION: 1. There are 3, enhancing lesions in the bilateral kidneys, as described above, which are favored to represent neoplasms. No significant interval change in size. No new suspicious renal lesion. 2. 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 No significant interval change in size. 3. Multiple other nonacute observations, as described above. Electronically Signed   By: Ree Molt M.D.   On: 11/15/2023 16:09   PERIPHERAL VASCULAR CATHETERIZATION Result Date: 10/24/2023 See surgical note for result.   Assessment/Plan  No problem-specific Assessment & Plan notes found for this encounter.    Selinda Gu, MD  11/23/2023 10:39 AM    This note was created with Dragon medical transcription system.  Any errors from dictation are purely unintentional

## 2023-11-25 NOTE — Assessment & Plan Note (Signed)
 Status post pulmonary thrombectomy and doing well.

## 2023-11-25 NOTE — Assessment & Plan Note (Signed)
 blood pressure control important in reducing the progression of atherosclerotic disease. On appropriate oral medications.

## 2023-11-25 NOTE — Assessment & Plan Note (Signed)
 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 No role for intervention for popliteal DVT Continue anticoagulation which she is going to do for at least 1 year to therapeutic dose.  We can discuss reducing this to a prophylactic dose at 1 year if he chooses.  I will see him back in about a year.

## 2023-12-04 ENCOUNTER — Telehealth: Admitting: Oncology

## 2023-12-04 ENCOUNTER — Inpatient Hospital Stay: Attending: Oncology | Admitting: Oncology

## 2023-12-04 ENCOUNTER — Encounter: Payer: Self-pay | Admitting: Oncology

## 2023-12-04 VITALS — BP 126/79 | HR 72 | Temp 97.9°F | Resp 18 | Ht 70.0 in | Wt 205.0 lb

## 2023-12-04 DIAGNOSIS — I2699 Other pulmonary embolism without acute cor pulmonale: Secondary | ICD-10-CM | POA: Insufficient documentation

## 2023-12-04 DIAGNOSIS — Z7901 Long term (current) use of anticoagulants: Secondary | ICD-10-CM | POA: Insufficient documentation

## 2023-12-04 DIAGNOSIS — I82432 Acute embolism and thrombosis of left popliteal vein: Secondary | ICD-10-CM | POA: Diagnosis not present

## 2023-12-04 NOTE — Progress Notes (Unsigned)
 Patient is doing ok, is anxious to see the results from his lab work done on 11/05/2023.

## 2023-12-04 NOTE — Progress Notes (Unsigned)
 [facility]  Telephone:(336) [phone] Fax:(336) [phone]  ID: [name] OB: 08/07/1945  MR#: [id number]  RDW#:[id number]  Patient Care Team: Glover Lenis, MD as PCP - General (Family Medicine)  CHIEF COMPLAINT: DVT, bilateral pulmonary embolism.  INTERVAL HISTORY: Patient returns to clinic today for further evaluation and discussion of his laboratory results.  He currently feels well and is asymptomatic.  He is tolerating Eliquis  without significant side effects.  He has no neurologic complaints.  He denies any recent fevers or illnesses.  He has a good appetite and denies weight loss.  He has no chest pain, shortness of breath, cough, or hemoptysis.  He denies any nausea, vomiting, constipation, or diarrhea.  He has no urinary complaints.  Patient offers no specific complaints today.  REVIEW OF SYSTEMS:   Review of Systems  Constitutional: Negative.  Negative for fever and malaise/fatigue.  Respiratory: Negative.  Negative for cough, hemoptysis and shortness of breath.   Cardiovascular: Negative.  Negative for chest pain and leg swelling.  Gastrointestinal: Negative.  Negative for abdominal pain.  Genitourinary: Negative.  Negative for dysuria.  Musculoskeletal: Negative.  Negative for back pain.  Skin: Negative.  Negative for rash.  Neurological: Negative.  Negative for dizziness, focal weakness, weakness and headaches.  Psychiatric/Behavioral: Negative.  The patient is not nervous/anxious.     As per HPI. Otherwise, a complete review of systems is negative.  PAST MEDICAL HISTORY: Past Medical History:  Diagnosis Date   Allergy    Arthritis    Bladder tumor    Chronic bilateral low back pain with bilateral sciatica    Chronic cough    DVT (deep venous thrombosis) (HCC) 04/2017   was on blood thinners until hematuria.  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   L lat abdomen, excised in [place]  04/30/19   Dysplastic nevus 06/11/2023   mid to  upper back spinal - severe, margins free but close, recheck in 6 mths   Dysplastic nevus 06/11/2023   mid to low back spinal - moderate   Eosinophilic asthma    Gallbladder disease    GERD (gastroesophageal reflux disease)    History of actinic keratoses 08/31/2022   left ear and preauricular bx proven treated already   History of Barrett's esophagus    History of BPH    History of cataract    History of kidney stones    bladder stones and 2 small kidney stones   History of pulmonary embolus (PE)    Hypertension    Thrombocytopenia    Tremor     PAST SURGICAL HISTORY: Past Surgical History:  Procedure Laterality Date   CHOLECYSTECTOMY  2010   COLONOSCOPY WITH PROPOFOL  N/A 12/02/2020   Procedure: COLONOSCOPY WITH PROPOFOL ;  Surgeon: Onita Elspeth Sharper, DO;  Location: [facility] ENDOSCOPY;  Service: Gastroenterology;  Laterality: N/A;   CYSTOSCOPY WITH LITHOLAPAXY N/A 11/21/2018   Procedure: CYSTOSCOPY WITH LITHOLAPAXY;  Surgeon: Kassie Sharper SAUNDERS, MD;  Location: [facility] ORS;  Service: Urology;  Laterality: N/A;   ESOPHAGOGASTRODUODENOSCOPY (EGD) WITH PROPOFOL  N/A 12/02/2020   Procedure: ESOPHAGOGASTRODUODENOSCOPY (EGD) WITH PROPOFOL ;  Surgeon: Onita Elspeth Sharper, DO;  Location: [facility] ENDOSCOPY;  Service: Gastroenterology;  Laterality: N/A;   EYE SURGERY Bilateral 2001   cataract extractions   GREEN LIGHT LASER TURP (TRANSURETHRAL RESECTION OF PROSTATE N/A 11/21/2018   Procedure: GREEN LIGHT LASER TURP (TRANSURETHRAL RESECTION OF PROSTATE;  Surgeon: Kassie Sharper SAUNDERS, MD;  Location: [facility] ORS;  Service: Urology;  Laterality: N/A;   HERNIA REPAIR  Left 2010   inguinal   NASAL SINUS SURGERY  1997, 2015   x 2   PROSTATE SURGERY  2001, 2011   .  2nd surgery was a turp   PROSTATOTOMY  11/21/2018   PULMONARY THROMBECTOMY Bilateral 10/24/2023   Procedure: PULMONARY THROMBECTOMY;  Surgeon: Marea Selinda RAMAN, MD;  Location: [facility] INVASIVE CV LAB;  Service: Cardiovascular;  Laterality: Bilateral;    TOTAL HIP ARTHROPLASTY Left 2016   TRANSURETHRAL RESECTION OF PROSTATE  11/07/2022   Procedure: TRANSURETHRAL RESECTION OF THE PROSTATE (TURP);  Surgeon: Twylla Glendia BROCKS, MD;  Location: [facility] ORS;  Service: Urology;;    FAMILY HISTORY: History reviewed. No pertinent family history.  ADVANCED DIRECTIVES (Y/N):  N  HEALTH MAINTENANCE: Social History   Tobacco Use   Smoking status: Never   Smokeless tobacco: Never  Vaping Use   Vaping status: Never Used  Substance Use Topics   Alcohol use: Yes    Comment: occ   Drug use: Never     Colonoscopy:  PAP:  Bone density:  Lipid panel:  Allergies  Allergen Reactions   Iodine  Shortness Of Breath and Cough    Intravenous iodine .  No problem with betadine   Sulfa Antibiotics Hives and Swelling    Current Outpatient Medications  Medication Sig Dispense Refill   albuterol  (VENTOLIN  HFA) 108 (90 Base) MCG/ACT inhaler Inhale 2 puffs into the lungs every 6 (six) hours as needed for wheezing or shortness of breath. 6.7 g 1   apixaban  (ELIQUIS ) 5 MG TABS tablet Take 2 tablets (10 mg total) by mouth 2 (two) times daily for 7 days, THEN 1 tablet (5 mg total) 2 (two) times daily. 60 tablet 3   Azelastine HCl 137 MCG/SPRAY SOLN Place 1 spray into both nostrils 2 (two) times daily.     bifidobacterium infantis (ALIGN) capsule Take 1 capsule by mouth daily.     celecoxib (CELEBREX) 200 MG capsule Take 200 mg by mouth daily.     doxylamine , Sleep, (UNISOM ) 25 MG tablet Take 25 mg by mouth at bedtime as needed.     fluticasone (FLONASE) 50 MCG/ACT nasal spray Place 1 spray into both nostrils daily.     levocetirizine (XYZAL) 5 MG tablet Take 5 mg by mouth at bedtime.     mepolizumab (NUCALA) 100 MG injection Inject 100 mg into the skin every 30 (thirty) days.      montelukast (SINGULAIR) 10 MG tablet Take 10 mg by mouth at bedtime.     Multiple Vitamin (MULTIVITAMIN WITH MINERALS) TABS tablet Take 1 tablet by mouth daily.     omeprazole (PRILOSEC)  40 MG capsule Take 40 mg by mouth at bedtime.     tamsulosin  (FLOMAX ) 0.4 MG CAPS capsule TAKE 1 CAPSULE BY MOUTH EVERYDAY AT BEDTIME 90 capsule 1   valsartan (DIOVAN) 40 MG tablet Take 40 mg by mouth every morning.     vardenafil  (LEVITRA ) 20 MG tablet 1 tablet 1 hour prior to intercourse as needed 12 tablet 3   No current facility-administered medications for this visit.    OBJECTIVE: Vitals:   12/04/23 1557  BP: 126/79  Pulse: 72  Resp: 18  Temp: 97.9 F (36.6 C)  SpO2: 100%     Body mass index is 29.41 kg/m.    ECOG FS:0 - Asymptomatic  General: Well-developed, well-nourished, no acute distress. Eyes: Pink conjunctiva, anicteric sclera. HEENT: Normocephalic, moist mucous membranes. Lungs: No audible wheezing or coughing. Heart: Regular rate and rhythm. Abdomen: Soft, nontender, no obvious  distention. Musculoskeletal: No edema, cyanosis, or clubbing. Neuro: Alert, answering all questions appropriately. Cranial nerves grossly intact. Skin: No rashes or petechiae noted. Psych: Normal affect.  LAB RESULTS:  Lab Results  Component Value Date   NA 140 10/24/2023   K 3.5 10/24/2023   CL 107 10/24/2023   CO2 24 10/24/2023   GLUCOSE 105 (H) 10/24/2023   BUN 17 10/24/2023   CREATININE 1.13 10/24/2023   CALCIUM 8.5 (L) 10/24/2023   GFRNONAA >60 10/24/2023   GFRAA >60 11/19/2018    Lab Results  Component Value Date   WBC 12.2 (H) 10/25/2023   NEUTROABS 5.1 10/24/2023   HGB 14.8 10/25/2023   HCT 42.5 10/25/2023   MCV 85.7 10/25/2023   PLT 150 10/25/2023     STUDIES: VAS US  LOWER EXTREMITY VENOUS (DVT) Result Date: 11/26/2023  Lower Venous DVT Study Patient Name:  BREION NOVACEK  Date of Exam:   11/23/2023 Medical Rec #: [id number]     Accession #:    [id number] Date of Birth: March 30, 1945      Patient Gender: M Patient Age:   78 years Exam Location:  [facility] Procedure:      VAS US  LOWER EXTREMITY VENOUS (DVT) Referring Phys: SELINDA DEW  --------------------------------------------------------------------------------  Indications: F/U DDVT Lt LE (PopV).  Risk Factors: DVT 10/21/2023: Lt LE DVT Surgery 10/24/2023: Thrombectomy. Comparison Study: 10/21/2023 Performing Technologist: Leafy Gibes RVS  Examination Guidelines: A complete evaluation includes B-mode imaging, spectral Doppler, color Doppler, and power Doppler as needed of all accessible portions of each vessel. Bilateral testing is considered an integral part of a complete examination. Limited examinations for reoccurring indications may be performed as noted. The reflux portion of the exam is performed with the patient in reverse Trendelenburg.  +---------+---------------+---------+-----------+----------+--------------+ LEFT     CompressibilityPhasicitySpontaneityPropertiesThrombus Aging +---------+---------------+---------+-----------+----------+--------------+ CFV      Full           Yes      Yes                                 +---------+---------------+---------+-----------+----------+--------------+ SFJ      Full           Yes      Yes                                 +---------+---------------+---------+-----------+----------+--------------+ FV Prox  Full           Yes      Yes                                 +---------+---------------+---------+-----------+----------+--------------+ FV Mid   Full           Yes      Yes                                 +---------+---------------+---------+-----------+----------+--------------+ FV DistalFull           Yes      Yes                                 +---------+---------------+---------+-----------+----------+--------------+ PFV      Full  Yes      Yes                                 +---------+---------------+---------+-----------+----------+--------------+ POP      Partial        Yes      Yes                                  +---------+---------------+---------+-----------+----------+--------------+ PTV      Full           Yes      Yes                                 +---------+---------------+---------+-----------+----------+--------------+ PERO     Full           Yes      Yes                                 +---------+---------------+---------+-----------+----------+--------------+ GSV      Full           Yes      Yes                                 +---------+---------------+---------+-----------+----------+--------------+ SSV      Full           Yes      Yes                                 +---------+---------------+---------+-----------+----------+--------------+     Summary: LEFT: - No change in DVT status of the Left Popliteal Vein, Still present with flow moving(Acute/Chronic).  *See table(s) above for measurements and observations. Electronically signed by Selinda Gu MD on 11/26/2023 at 11:14:23 AM.    Final    MR Abdomen W Wo Contrast Result Date: 11/15/2023 CLINICAL DATA:  Renal mass. EXAM: MRI ABDOMEN WITHOUT AND WITH CONTRAST TECHNIQUE: Multiplanar multisequence MR imaging of the abdomen was performed both before and after the administration of intravenous contrast. CONTRAST:  9mL GADAVIST  GADOBUTROL  1 MMOL/ML IV SOLN COMPARISON:  MRI abdomen from 09/04/2022. FINDINGS: Lower chest: Unremarkable MR appearance to the lung bases. No pleural effusion. No pericardial effusion. Normal heart size. Hepatobiliary: Liver is normal in size. Noncirrhotic configuration. There is a stable subcentimeter simple cyst in the subcapsular left hepatic lobe, segment 2. No suspicious liver lesion. No intrahepatic or extrahepatic bile duct dilatation. No choledocholithiasis. Unremarkable gallbladder. Pancreas: There is marked atrophic/fatty replacement of pancreas. There are at least 3, T2 hyperintense, nonenhancing structures in the pancreatic head/uncinate process (marked with electronic arrow sign on series 4),  with largest lesion in the uncinate process measuring up to 14 x 18 mm. There is no direct communication of the lesion with pancreatic main duct or side branches. However, no significant interval change since the prior study. 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 Continued follow-up is recommended. No new suspicious pancreatic lesion. No peripancreatic fat stranding. Main pancreatic duct is nondilated. Spleen: Within normal limits in size and appearance. No focal mass. Adrenals/Urinary Tract: Unremarkable adrenal glands. No hydroureteronephrosis on either side. Redemonstration of  previously seen focal renal lesions, which are described as follows: *There is a T1 hypointense and T2 heterogeneous mildly hyperintense lesion arising from the left kidney upper pole, posteriorly measuring 13 x 14 mm. The lesion exhibits enhancement on the postcontrast images. No significant interval change when compared to the prior exam. This is compatible with neoplasm. *There is a predominantly exophytic 7 x 10 mm lesion arising from the left kidney lower pole, posterolaterally which appears T1 hypointense and T2 hypointense. The lesion exhibits moderate enhancement and is also favored to represent new neoplasm. No significant interval change in size. *There is a 7 x 8 mm predominantly exophytic lesion arising from the right kidney lower pole, posteromedially which also enhances and is favored to represent neoplasm. No significant interval change in size. No new suspicious renal lesion. Redemonstration of focal scarring in the left kidney upper pole, laterally. Stomach/Bowel: Visualized portions within the abdomen are unremarkable. No disproportionate dilation of bowel loops. Vascular/Lymphatic: No pathologically enlarged lymph nodes identified. No abdominal aortic aneurysm demonstrated. No ascites. Other:  None. Musculoskeletal: No suspicious bone lesions identified. IMPRESSION: 1. There are 3, enhancing  lesions in the bilateral kidneys, as described above, which are favored to represent neoplasms. No significant interval change in size. No new suspicious renal lesion. 2. 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 No significant interval change in size. 3. Multiple other nonacute observations, as described above. Electronically Signed   By: Ree Molt M.D.   On: 11/15/2023 16:09    ASSESSMENT: DVT, bilateral pulmonary embolism.  PLAN:    DVT, bilateral pulmonary embolism: This is patient's second blood lifetime blood clot.  Previously approximately 6 years ago DVT was thought to be secondary to a transient risk factor of a long car ride.  Patient has no obvious transient risk factors currently.  Given the extensive nature of the bilateral pulmonary embolism as well as his history, previously recommend patient continue Eliquis  lifelong.  Agree with vascular surgery assessment the patient should remain on treatment doses of Eliquis  for a minimum of 1 year and then can transition to prophylactic dosing.  Patient noted to have a positive lupus anticoagulant which is likely a false positive given that patient was on Eliquis  at the time of testing.  We discussed repeat testing in 3 months, but since this will not change the overall recommendations patient declined.  The remainder of hypercoagulable workup is negative.  No further intervention is needed.  No further follow-up has been scheduled.  Please refer patient back if there are any questions or concerns.    I spent a total of 30 minutes reviewing chart data, face-to-face evaluation with the patient, counseling and coordination of care as detailed above.   Patient expressed understanding and was in agreement with this plan. He also understands that He can call clinic at any time with any questions, concerns, or complaints.      Evalene JINNY Reusing, MD   12/04/2023 4:00 PM

## 2023-12-05 ENCOUNTER — Ambulatory Visit: Admitting: Urology

## 2023-12-05 ENCOUNTER — Encounter: Payer: Self-pay | Admitting: Urology

## 2023-12-05 VITALS — BP 144/78 | HR 90 | Ht 70.0 in | Wt 203.0 lb

## 2023-12-05 DIAGNOSIS — R972 Elevated prostate specific antigen [PSA]: Secondary | ICD-10-CM | POA: Diagnosis not present

## 2023-12-05 DIAGNOSIS — N5201 Erectile dysfunction due to arterial insufficiency: Secondary | ICD-10-CM

## 2023-12-05 DIAGNOSIS — N2889 Other specified disorders of kidney and ureter: Secondary | ICD-10-CM

## 2023-12-05 NOTE — Progress Notes (Signed)
 12/05/2023 10:15 AM   [name] 1945/07/18 [id number]  Referring provider: Glover Lenis, MD [phone] [place],  [place] [address]  Chief Complaint  Patient presents with   Results   Urologic history  1. BPH with LUTS Greenlight PVP/cystolitholapaxy Dr Kassie 11/2018.  Tamsulosin  0.4 mg daily   2. Elevated PSA PSA 2023 6.8; prostate MRI with 84 cc gland no high-grade lesions, with PIRADS 3 lesions, no high-grade lesions. Prior MRI showed PIRADS 3 lesions x2 for which he elected surveillance.    3. Renal mass Small enhancing bilateral renal lesions stable since 2022.    4. Erectile dysfunction On vardenafil  Intracavernosal injections in the past.   5. History sterile pyuria. Cystoscopy showed inflammatory changes, prostatic urethral mucosa, and papillary tumor at bladder neck Prostatic urethra biopsy/resection 11/07/2022 showed urothelial mucosa with ulceration, squamous metaplasia, focal keratinization, and focal atypia in prostatic glands   HPI: Daniel Mason is a 78 y.o. male who presents for 53-month follow-up.  Hospitalized bilateral pulmonary emboli September 2025; presently on Eliquis  Denies dysuria, gross hematuria No flank, abdominal or pelvic pain PSA 11/12/2023 stable at 6.1 Renal mass protocol MRI 11/15/2023 with stable enhancing renal masses measuring 13 x 14 mm; 7 x 10 mm; 7 x 8 mm Worsening ED, Levitra  not effective at 40 mg.  Had done intracavernosal injections in the past however states having to keep the product refrigerated with degradation was a problem  PMH: Past Medical History:  Diagnosis Date   Allergy    Arthritis    Bladder tumor    Chronic bilateral low back pain with bilateral sciatica    Chronic cough    DVT (deep venous thrombosis) (HCC) 04/2017   was on blood thinners until hematuria.  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   L lat abdomen, excised in [place]  04/30/19   Dysplastic nevus 06/11/2023   mid to upper back  spinal - severe, margins free but close, recheck in 6 mths   Dysplastic nevus 06/11/2023   mid to low back spinal - moderate   Eosinophilic asthma    Gallbladder disease    GERD (gastroesophageal reflux disease)    History of actinic keratoses 08/31/2022   left ear and preauricular bx proven treated already   History of Barrett's esophagus    History of BPH    History of cataract    History of kidney stones    bladder stones and 2 small kidney stones   History of pulmonary embolus (PE)    Hypertension    Thrombocytopenia    Tremor     Surgical History: Past Surgical History:  Procedure Laterality Date   CHOLECYSTECTOMY  2010   COLONOSCOPY WITH PROPOFOL  N/A 12/02/2020   Procedure: COLONOSCOPY WITH PROPOFOL ;  Surgeon: Onita Elspeth Sharper, DO;  Location: [facility] ENDOSCOPY;  Service: Gastroenterology;  Laterality: N/A;   CYSTOSCOPY WITH LITHOLAPAXY N/A 11/21/2018   Procedure: CYSTOSCOPY WITH LITHOLAPAXY;  Surgeon: Kassie Sharper SAUNDERS, MD;  Location: [facility] ORS;  Service: Urology;  Laterality: N/A;   ESOPHAGOGASTRODUODENOSCOPY (EGD) WITH PROPOFOL  N/A 12/02/2020   Procedure: ESOPHAGOGASTRODUODENOSCOPY (EGD) WITH PROPOFOL ;  Surgeon: Onita Elspeth Sharper, DO;  Location: [facility] ENDOSCOPY;  Service: Gastroenterology;  Laterality: N/A;   EYE SURGERY Bilateral 2001   cataract extractions   GREEN LIGHT LASER TURP (TRANSURETHRAL RESECTION OF PROSTATE N/A 11/21/2018   Procedure: GREEN LIGHT LASER TURP (TRANSURETHRAL RESECTION OF PROSTATE;  Surgeon: Kassie Sharper SAUNDERS, MD;  Location: [facility] ORS;  Service: Urology;  Laterality: N/A;  HERNIA REPAIR Left 2010   inguinal   NASAL SINUS SURGERY  1997, 2015   x 2   PROSTATE SURGERY  2001, 2011   .  2nd surgery was a turp   PROSTATOTOMY  11/21/2018   PULMONARY THROMBECTOMY Bilateral 10/24/2023   Procedure: PULMONARY THROMBECTOMY;  Surgeon: Marea Selinda RAMAN, MD;  Location: [facility] INVASIVE CV LAB;  Service: Cardiovascular;  Laterality: Bilateral;   TOTAL HIP  ARTHROPLASTY Left 2016   TRANSURETHRAL RESECTION OF PROSTATE  11/07/2022   Procedure: TRANSURETHRAL RESECTION OF THE PROSTATE (TURP);  Surgeon: Twylla Glendia BROCKS, MD;  Location: [facility] ORS;  Service: Urology;;    Home Medications:  Allergies as of 12/05/2023       Reactions   Iodine  Shortness Of Breath, Cough   Intravenous iodine .  No problem with betadine   Sulfa Antibiotics Hives, Swelling        Medication List        Accurate as of December 05, 2023 10:15 AM. If you have any questions, ask your nurse or doctor.          albuterol  108 (90 Base) MCG/ACT inhaler Commonly known as: VENTOLIN  HFA Inhale 2 puffs into the lungs every 6 (six) hours as needed for wheezing or shortness of breath.   Azelastine HCl 137 MCG/SPRAY Soln Place 1 spray into both nostrils 2 (two) times daily.   bifidobacterium infantis capsule Take 1 capsule by mouth daily.   celecoxib 200 MG capsule Commonly known as: CELEBREX Take 200 mg by mouth daily.   doxylamine  (Sleep) 25 MG tablet Commonly known as: UNISOM  Take 25 mg by mouth at bedtime as needed.   Eliquis  5 MG Tabs tablet Generic drug: apixaban  Take 2 tablets (10 mg total) by mouth 2 (two) times daily for 7 days, THEN 1 tablet (5 mg total) 2 (two) times daily. Start taking on: October 25, 2023   fluticasone 50 MCG/ACT nasal spray Commonly known as: FLONASE Place 1 spray into both nostrils daily.   levocetirizine 5 MG tablet Commonly known as: XYZAL Take 5 mg by mouth at bedtime.   mepolizumab 100 MG injection Commonly known as: NUCALA Inject 100 mg into the skin every 30 (thirty) days.   montelukast 10 MG tablet Commonly known as: SINGULAIR Take 10 mg by mouth at bedtime.   multivitamin with minerals Tabs tablet Take 1 tablet by mouth daily.   omeprazole 40 MG capsule Commonly known as: PRILOSEC Take 40 mg by mouth at bedtime.   tamsulosin  0.4 MG Caps capsule Commonly known as: FLOMAX  TAKE 1 CAPSULE BY MOUTH EVERYDAY  AT BEDTIME   valsartan 40 MG tablet Commonly known as: DIOVAN Take 40 mg by mouth every morning.   vardenafil  20 MG tablet Commonly known as: LEVITRA  1 tablet 1 hour prior to intercourse as needed        Allergies:  Allergies  Allergen Reactions   Iodine  Shortness Of Breath and Cough    Intravenous iodine .  No problem with betadine   Sulfa Antibiotics Hives and Swelling    Family History: No family history on file.  Social History:  reports that he has never smoked. He has never used smokeless tobacco. He reports current alcohol use. He reports that he does not use drugs.   Physical Exam: BP (!) 144/78   Pulse 90   Ht 5' 10 (1.778 m)   Wt 203 lb (92.1 kg)   BMI 29.13 kg/m   Constitutional:  Alert, No acute distress. HEENT: [place] AT Respiratory:  Normal respiratory effort, no increased work of breathing. Psychiatric: Normal mood and affect.   Assessment & Plan:    1.  Elevated PSA Stable  2.  Bilateral renal masses Stable Follow-up MRI 1 year  3.  Erectile dysfunction We discussed commercial intracavernosal injection products have a long shelf life and do not need to be refrigerated He is potentially interested in pain out-of-pocket for Edex and will check on pricing.  We reviewed the injection technique   Glendia JAYSON Barba, MD  [facility] [address], [address] [place], [place] [address] [phone]

## 2023-12-05 NOTE — Patient Instructions (Addendum)
 [email or web address]

## 2023-12-17 ENCOUNTER — Ambulatory Visit: Admitting: Urology

## 2023-12-19 ENCOUNTER — Ambulatory Visit: Admitting: Dermatology

## 2023-12-19 ENCOUNTER — Encounter: Payer: Self-pay | Admitting: Dermatology

## 2023-12-19 DIAGNOSIS — W908XXA Exposure to other nonionizing radiation, initial encounter: Secondary | ICD-10-CM | POA: Diagnosis not present

## 2023-12-19 DIAGNOSIS — Q825 Congenital non-neoplastic nevus: Secondary | ICD-10-CM

## 2023-12-19 DIAGNOSIS — Z1283 Encounter for screening for malignant neoplasm of skin: Secondary | ICD-10-CM | POA: Diagnosis not present

## 2023-12-19 DIAGNOSIS — D229 Melanocytic nevi, unspecified: Secondary | ICD-10-CM

## 2023-12-19 DIAGNOSIS — L570 Actinic keratosis: Secondary | ICD-10-CM | POA: Diagnosis not present

## 2023-12-19 DIAGNOSIS — Z86018 Personal history of other benign neoplasm: Secondary | ICD-10-CM

## 2023-12-19 DIAGNOSIS — L821 Other seborrheic keratosis: Secondary | ICD-10-CM

## 2023-12-19 DIAGNOSIS — L814 Other melanin hyperpigmentation: Secondary | ICD-10-CM

## 2023-12-19 DIAGNOSIS — L578 Other skin changes due to chronic exposure to nonionizing radiation: Secondary | ICD-10-CM | POA: Diagnosis not present

## 2023-12-19 NOTE — Progress Notes (Signed)
 Follow-Up Visit   Subjective  Daniel Mason is a 78 y.o. male who presents for the following: recheck dysplastic nevus with severe atypia. Mid to upper back spinal. Close to margin. Bx: 06/11/2023.  The patient presents for Upper Body Skin Exam (UBSE) for skin cancer screening and mole check.  The patient has spots, moles and lesions to be evaluated, some may be new or changing and the patient has concerns that these could be cancer.  The following portions of the chart were reviewed this encounter and updated as appropriate: medications, allergies, medical history  Review of Systems:  No other skin or systemic complaints except as noted in HPI or Assessment and Plan.  Objective  Well appearing patient in no apparent distress; mood and affect are within normal limits.  A focused examination was performed of the following areas: All skin waist up and right leg examined.  Relevant physical exam findings are noted in the Assessment and Plan.       Scalp x1, L ear x1, R postauricular neck x1 (3) Erythematous thin papules/macules with gritty scale.   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 06/11/2023. Mid to low back spinal, moderate. 06/11/2023 No evidence of recurrence today Recommend regular full body skin exams Recommend daily broad spectrum sunscreen SPF 30+ to sun-exposed areas, reapply every 2 hours as needed.  Call if any new or changing lesions are noted between office visits   LENTIGINES, SEBORRHEIC KERATOSES, HEMANGIOMAS - Benign normal skin lesions - Benign-appearing - Call for any changes  MELANOCYTIC NEVI - Tan-brown and/or pink-flesh-colored symmetric macules and papules - Benign appearing on exam today - Observation - Call clinic for new or changing moles - Recommend daily use of broad spectrum spf 30+ sunscreen to sun-exposed areas.   ACTINIC DAMAGE - Chronic condition, secondary to cumulative UV/sun exposure -  diffuse scaly erythematous macules with underlying dyspigmentation - Recommend daily broad spectrum sunscreen SPF 30+ to sun-exposed areas, reapply every 2 hours as needed.  - Staying in the shade or wearing long sleeves, sun glasses (UVA+UVB protection) and wide brim hats (4-inch brim around the entire circumference of the hat) are also recommended for sun protection.  - Call for new or changing lesions.  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  Stable. Observation.  Call clinic for new or changing moles.   Recommend daily use of broad spectrum spf 30+ sunscreen to sun-exposed areas.    ABCDEs of mole observation discussed and patient handout given.  RTC if any changes noted.  AK (ACTINIC KERATOSIS) (3) Scalp x1, L ear x1, R postauricular neck x1 (3) Actinic keratoses are precancerous spots that appear secondary to cumulative UV radiation exposure/sun exposure over time. They are chronic with expected duration over 1 year. A portion of actinic keratoses will progress to squamous cell carcinoma of the skin. It is not possible to reliably predict which spots will progress to skin cancer and so treatment is recommended to prevent development of skin cancer.  Recommend daily broad spectrum sunscreen SPF 30+ to sun-exposed areas, reapply every 2 hours as needed.  Recommend staying in the shade or wearing long sleeves, sun glasses (UVA+UVB protection) and wide brim hats (4-inch brim around the entire circumference of the hat). Call for new or changing lesions. Destruction of lesion - Scalp x1, L ear x1, R postauricular neck  x1 (3) Complexity: simple   Destruction method: cryotherapy   Informed consent: discussed and consent obtained   Timeout:  patient name, date of birth, surgical site, and procedure verified Lesion destroyed using liquid  nitrogen: Yes   Region frozen until ice ball extended beyond lesion: Yes   Outcome: patient tolerated procedure well with no complications   Post-procedure details: wound care instructions given   Additional details:  Prior to procedure, discussed risks of blister formation, small wound, skin dyspigmentation, or rare scar following cryotherapy. Recommend Vaseline ointment to treated areas while healing.     Return for TBSE As Scheduled, With Dr. Hester.  I, Jill Parcell, CMA, am acting as scribe for Alm Hester, MD.   Documentation: I have reviewed the above documentation for accuracy and completeness, and I agree with the above.  Alm Hester, MD

## 2023-12-19 NOTE — Patient Instructions (Signed)
 Cryotherapy Aftercare  Wash gently with soap and water everyday.   Apply Vaseline Jelly daily until healed.     Recommend daily broad spectrum sunscreen SPF 30+ to sun-exposed areas, reapply every 2 hours as needed. Call for new or changing lesions.  Staying in the shade or wearing long sleeves, sun glasses (UVA+UVB protection) and wide brim hats (4-inch brim around the entire circumference of the hat) are also recommended for sun protection.      Due to recent changes in healthcare laws, you may see results of your pathology and/or laboratory studies on MyChart before the doctors have had a chance to review them. We understand that in some cases there may be results that are confusing or concerning to you. Please understand that not all results are received at the same time and often the doctors may need to interpret multiple results in order to provide you with the best plan of care or course of treatment. Therefore, we ask that you please give us  2 business days to thoroughly review all your results before contacting the office for clarification. Should we see a critical lab result, you will be contacted sooner.   If You Need Anything After Your Visit  If you have any questions or concerns for your doctor, please call our main line at [phone] and press option 4 to reach your doctor's medical assistant. If no one answers, please leave a voicemail as directed and we will return your call as soon as possible. Messages left after 4 pm will be answered the following business day.   You may also send us  a message via MyChart. We typically respond to MyChart messages within 1-2 business days.  For prescription refills, please ask your pharmacy to contact our office. Our fax number is [phone].  If you have an urgent issue when the clinic is closed that cannot wait until the next business day, you can page your doctor at the number below.    Please note that while we do our best to be  available for urgent issues outside of office hours, we are not available 24/7.   If you have an urgent issue and are unable to reach us , you may choose to seek medical care at your doctor's office, retail clinic, urgent care center, or emergency room.  If you have a medical emergency, please immediately call 911 or go to the emergency department.  Pager Numbers  - Dr. Hester: [phone]  - Dr. Jackquline: [phone]  - Dr. Claudene: [phone]   - Dr. Raymund: [phone]  In the event of inclement weather, please call our main line at [phone] for an update on the status of any delays or closures.  Dermatology Medication Tips: Please keep the boxes that topical medications come in in order to help keep track of the instructions about where and how to use these. Pharmacies typically print the medication instructions only on the boxes and not directly on the medication tubes.   If your medication is too expensive, please contact our office at [phone] option 4 or send us  a message through MyChart.   We are unable to tell what your co-pay for medications will be in advance as this is different depending on your insurance coverage. However, we may be able to find a substitute medication at lower cost or fill out paperwork to get insurance to cover a needed medication.   If a prior authorization is required to get your medication covered by your insurance company, please allow us   1-2 business days to complete this process.  Drug prices often vary depending on where the prescription is filled and some pharmacies may offer cheaper prices.  The website www.goodrx.com contains coupons for medications through different pharmacies. The prices here do not account for what the cost may be with help from insurance (it may be cheaper with your insurance), but the website can give you the price if you did not use any insurance.  - You can print the associated coupon and take it with your  prescription to the pharmacy.  - You may also stop by our office during regular business hours and pick up a GoodRx coupon card.  - If you need your prescription sent electronically to a different pharmacy, notify our office through [facility] or by phone at [phone] option 4.     Si Usted Necesita Algo Despus de Su Visita  Tambin puede enviarnos un mensaje a travs de Clinical cytogeneticist. Por lo general respondemos a los mensajes de MyChart en el transcurso de 1 a 2 das hbiles.  Para renovar recetas, por favor pida a su farmacia que se ponga en contacto con nuestra oficina. Randi lakes de fax es [place] [phone].  Si tiene un asunto urgente cuando la clnica est cerrada y que no puede esperar hasta el siguiente da hbil, puede llamar/localizar a su doctor(a) al nmero que aparece a continuacin.   Por favor, tenga en cuenta que aunque hacemos todo lo posible para estar disponibles para asuntos urgentes fuera del horario de [place], no estamos disponibles las 24 horas del da, los 7 [address]  [address] de la [place].   Si tiene un problema urgente y no puede comunicarse con nosotros, puede optar por buscar atencin mdica  en el consultorio de su doctor(a), en una clnica privada, en un centro de atencin urgente o en una sala de emergencias.  Si tiene Engineer, drilling, por favor llame inmediatamente al 911 o vaya a la sala de emergencias.  Nmeros de bper  - Dr. Hester: [phone]  - Dra. Jackquline: [phone]  - Dr. Claudene: [phone]  - Dra. Kitts: [phone]  En caso de inclemencias del [place], por favor llame a nuestra lnea principal al [phone] para una actualizacin sobre el estado de cualquier retraso o cierre.  Consejos para la medicacin en dermatologa: Por favor, guarde las cajas en las que vienen los medicamentos de uso tpico para ayudarle a seguir las instrucciones sobre dnde y cmo usarlos. Las farmacias generalmente imprimen las instrucciones del  medicamento slo en las cajas y no directamente en los tubos del [place].   Si su medicamento es muy caro, por favor, pngase en contacto con landry rieger llamando al [phone] y presione la opcin 4 o envenos un mensaje a travs de Clinical cytogeneticist.   No podemos decirle cul ser su copago por los medicamentos por adelantado ya que esto es diferente dependiendo de la cobertura de su seguro. Sin embargo, es posible que podamos encontrar un medicamento sustituto a Audiological scientist un formulario para que el seguro cubra el medicamento que se considera necesario.   Si se requiere una autorizacin previa para que su compaa de seguros malta su medicamento, por favor permtanos de 1 a 2 das hbiles para completar este proceso.  Los precios de los medicamentos varan con frecuencia dependiendo del Environmental consultant de dnde se surte la receta y alguna farmacias pueden ofrecer precios ms baratos.  El sitio web www.goodrx.com tiene cupones para medicamentos de Health and safety inspector. Los precios aqu no tienen en  cuenta lo que podra costar con la ayuda del seguro (puede ser ms barato con su seguro), pero el sitio web puede darle el precio si no Visual merchandiser.  - Puede imprimir el cupn correspondiente y llevarlo con su receta a la farmacia.  - Tambin puede pasar por nuestra oficina durante el horario de atencin regular y Education officer, museum una tarjeta de cupones de GoodRx.  - Si necesita que su receta se enve electrnicamente a una farmacia diferente, informe a nuestra oficina a travs de MyChart de [place] o por telfono llamando al [phone] y presione la opcin 4.

## 2023-12-20 ENCOUNTER — Encounter: Payer: Self-pay | Admitting: Dermatology

## 2024-01-14 ENCOUNTER — Encounter: Payer: Self-pay | Admitting: Urology

## 2024-01-15 ENCOUNTER — Other Ambulatory Visit: Payer: Self-pay | Admitting: Urology

## 2024-01-24 ENCOUNTER — Other Ambulatory Visit: Payer: Self-pay | Admitting: Urology

## 2024-02-14 ENCOUNTER — Ambulatory Visit: Admitting: Urology

## 2024-02-19 ENCOUNTER — Encounter (INDEPENDENT_AMBULATORY_CARE_PROVIDER_SITE_OTHER): Payer: Self-pay | Admitting: Vascular Surgery

## 2024-02-20 ENCOUNTER — Telehealth (INDEPENDENT_AMBULATORY_CARE_PROVIDER_SITE_OTHER): Payer: Self-pay

## 2024-02-20 NOTE — Telephone Encounter (Signed)
 Patient is wanting to schedule back surgery to relieve pain in his le/hamstring in March/April, his concern can this be done at this time, 6 months post PE? He is awaiting to hear from us  before he schedules the procedure. Please advise.

## 2024-02-21 NOTE — Telephone Encounter (Signed)
 It would probably be better to wait a year.  He will also need to have an IVC filter placed before surgery

## 2024-02-25 NOTE — Telephone Encounter (Signed)
 I tried to leave patient a message at this time with provider feedback about his concerns with scheduling upcoming surgery, no answer/busy signal at this time. Will try back at a later time.

## 2024-02-25 NOTE — Telephone Encounter (Signed)
 Spoke to patient at this time and he stated that he is willing to get on board with whatever he needs to do, the IVC filter sounds like a plan, he stated he doesn't want to continue getting steroid shots that do not last long and provide minimal relief. Patient stated he was tentatively planning surgery for march or April. He needs relief. Please advise.

## 2024-02-26 NOTE — Telephone Encounter (Signed)
 Would you be ok with him having surgery ( see note) at that time with an IVC given recent PE?

## 2024-02-26 NOTE — Telephone Encounter (Signed)
 Have him come in to discuss ivc filter placement with L DVT study JD/FB

## 2024-02-28 ENCOUNTER — Other Ambulatory Visit (INDEPENDENT_AMBULATORY_CARE_PROVIDER_SITE_OTHER): Payer: Self-pay | Admitting: Nurse Practitioner

## 2024-02-28 DIAGNOSIS — I82432 Acute embolism and thrombosis of left popliteal vein: Secondary | ICD-10-CM

## 2024-02-29 ENCOUNTER — Ambulatory Visit (INDEPENDENT_AMBULATORY_CARE_PROVIDER_SITE_OTHER)

## 2024-02-29 ENCOUNTER — Ambulatory Visit (INDEPENDENT_AMBULATORY_CARE_PROVIDER_SITE_OTHER): Admitting: Vascular Surgery

## 2024-02-29 DIAGNOSIS — I82432 Acute embolism and thrombosis of left popliteal vein: Secondary | ICD-10-CM

## 2024-03-14 ENCOUNTER — Ambulatory Visit (INDEPENDENT_AMBULATORY_CARE_PROVIDER_SITE_OTHER): Admitting: Nurse Practitioner

## 2024-03-14 ENCOUNTER — Encounter (INDEPENDENT_AMBULATORY_CARE_PROVIDER_SITE_OTHER): Payer: Self-pay | Admitting: Nurse Practitioner

## 2024-03-26 ENCOUNTER — Ambulatory Visit: Admitting: Urology

## 2024-04-08 ENCOUNTER — Ambulatory Visit: Admitting: Urology

## 2024-06-17 ENCOUNTER — Ambulatory Visit: Admitting: Dermatology

## 2024-10-24 ENCOUNTER — Ambulatory Visit (INDEPENDENT_AMBULATORY_CARE_PROVIDER_SITE_OTHER): Admitting: Vascular Surgery

## 2024-12-05 ENCOUNTER — Ambulatory Visit: Admitting: Urology
